# Patient Record
Sex: Male | Born: 1935
Health system: Southern US, Community
[De-identification: ages and names within clinical notes are randomized; demographics above are authoritative.]

## PROBLEM LIST (undated history)

## (undated) DIAGNOSIS — I10 Essential (primary) hypertension: Secondary | ICD-10-CM

## (undated) DIAGNOSIS — N179 Acute kidney failure, unspecified: Secondary | ICD-10-CM

## (undated) DIAGNOSIS — M109 Gout, unspecified: Secondary | ICD-10-CM

## (undated) HISTORY — PX: PROSTATE SURGERY: SHX751

---

## 1999-07-25 ENCOUNTER — Other Ambulatory Visit: Admission: RE | Admit: 1999-07-25 | Discharge: 1999-07-25 | Payer: Self-pay | Admitting: Urology

## 1999-08-17 ENCOUNTER — Encounter: Admission: RE | Admit: 1999-08-17 | Discharge: 1999-11-15 | Payer: Self-pay | Admitting: Radiation Oncology

## 1999-10-03 ENCOUNTER — Ambulatory Visit (HOSPITAL_BASED_OUTPATIENT_CLINIC_OR_DEPARTMENT_OTHER): Admission: RE | Admit: 1999-10-03 | Discharge: 1999-10-03 | Payer: Self-pay | Admitting: Urology

## 1999-10-03 ENCOUNTER — Encounter: Payer: Self-pay | Admitting: Urology

## 2007-06-18 ENCOUNTER — Ambulatory Visit (HOSPITAL_COMMUNITY): Admission: RE | Admit: 2007-06-18 | Discharge: 2007-06-18 | Payer: Self-pay | Admitting: Ophthalmology

## 2007-07-16 ENCOUNTER — Ambulatory Visit (HOSPITAL_COMMUNITY): Admission: RE | Admit: 2007-07-16 | Discharge: 2007-07-16 | Payer: Self-pay | Admitting: Ophthalmology

## 2009-10-21 ENCOUNTER — Encounter: Payer: Self-pay | Admitting: Internal Medicine

## 2009-11-12 ENCOUNTER — Ambulatory Visit: Payer: Self-pay | Admitting: Internal Medicine

## 2009-11-12 ENCOUNTER — Ambulatory Visit (HOSPITAL_COMMUNITY): Admission: RE | Admit: 2009-11-12 | Discharge: 2009-11-12 | Payer: Self-pay | Admitting: Internal Medicine

## 2011-09-19 LAB — BASIC METABOLIC PANEL
BUN: 21
CO2: 23
Calcium: 8.5
Chloride: 107
Creatinine, Ser: 1.47
GFR calc Af Amer: 56 — ABNORMAL LOW
GFR calc non Af Amer: 46 — ABNORMAL LOW
Glucose, Bld: 97
Potassium: 4.4
Sodium: 138

## 2011-09-19 LAB — HEMOGLOBIN AND HEMATOCRIT, BLOOD
HCT: 38.1 — ABNORMAL LOW
Hemoglobin: 13.1

## 2012-06-20 ENCOUNTER — Ambulatory Visit (HOSPITAL_COMMUNITY)
Admission: RE | Admit: 2012-06-20 | Discharge: 2012-06-20 | Disposition: A | Discharge status: Home / Self Care | Payer: Medicare Other | Source: Ambulatory Visit | Attending: Internal Medicine | Admitting: Internal Medicine

## 2012-06-20 ENCOUNTER — Other Ambulatory Visit (HOSPITAL_COMMUNITY): Payer: Self-pay | Admitting: Internal Medicine

## 2012-06-20 DIAGNOSIS — M899 Disorder of bone, unspecified: Secondary | ICD-10-CM | POA: Insufficient documentation

## 2012-06-20 DIAGNOSIS — M25572 Pain in left ankle and joints of left foot: Secondary | ICD-10-CM

## 2012-06-20 DIAGNOSIS — M898X9 Other specified disorders of bone, unspecified site: Secondary | ICD-10-CM | POA: Insufficient documentation

## 2012-06-20 DIAGNOSIS — M25579 Pain in unspecified ankle and joints of unspecified foot: Secondary | ICD-10-CM | POA: Insufficient documentation

## 2012-06-20 DIAGNOSIS — M949 Disorder of cartilage, unspecified: Secondary | ICD-10-CM | POA: Insufficient documentation

## 2014-08-21 ENCOUNTER — Observation Stay (HOSPITAL_COMMUNITY)
Admission: EM | Admit: 2014-08-21 | Discharge: 2014-08-22 | Disposition: A | Discharge status: Home / Self Care | Payer: Medicare Other | Attending: Internal Medicine | Admitting: Internal Medicine

## 2014-08-21 ENCOUNTER — Encounter (HOSPITAL_COMMUNITY): Payer: Self-pay | Admitting: Emergency Medicine

## 2014-08-21 ENCOUNTER — Emergency Department (HOSPITAL_COMMUNITY): Payer: Medicare Other

## 2014-08-21 DIAGNOSIS — N179 Acute kidney failure, unspecified: Secondary | ICD-10-CM | POA: Diagnosis not present

## 2014-08-21 DIAGNOSIS — R112 Nausea with vomiting, unspecified: Secondary | ICD-10-CM | POA: Diagnosis not present

## 2014-08-21 DIAGNOSIS — I10 Essential (primary) hypertension: Secondary | ICD-10-CM | POA: Diagnosis not present

## 2014-08-21 DIAGNOSIS — N189 Chronic kidney disease, unspecified: Secondary | ICD-10-CM

## 2014-08-21 DIAGNOSIS — K219 Gastro-esophageal reflux disease without esophagitis: Secondary | ICD-10-CM | POA: Insufficient documentation

## 2014-08-21 DIAGNOSIS — R072 Precordial pain: Secondary | ICD-10-CM | POA: Diagnosis not present

## 2014-08-21 DIAGNOSIS — I059 Rheumatic mitral valve disease, unspecified: Secondary | ICD-10-CM

## 2014-08-21 DIAGNOSIS — R079 Chest pain, unspecified: Secondary | ICD-10-CM | POA: Diagnosis present

## 2014-08-21 DIAGNOSIS — Z79899 Other long term (current) drug therapy: Secondary | ICD-10-CM | POA: Insufficient documentation

## 2014-08-21 HISTORY — DX: Gout, unspecified: M10.9

## 2014-08-21 HISTORY — DX: Essential (primary) hypertension: I10

## 2014-08-21 LAB — CBC WITH DIFFERENTIAL/PLATELET
Basophils Absolute: 0 10*3/uL (ref 0.0–0.1)
Basophils Relative: 1 % (ref 0–1)
EOS PCT: 1 % (ref 0–5)
Eosinophils Absolute: 0.1 10*3/uL (ref 0.0–0.7)
HCT: 35.3 % — ABNORMAL LOW (ref 39.0–52.0)
HEMOGLOBIN: 12 g/dL — AB (ref 13.0–17.0)
LYMPHS ABS: 1.1 10*3/uL (ref 0.7–4.0)
LYMPHS PCT: 14 % (ref 12–46)
MCH: 33.6 pg (ref 26.0–34.0)
MCHC: 34 g/dL (ref 30.0–36.0)
MCV: 98.9 fL (ref 78.0–100.0)
Monocytes Absolute: 0.5 10*3/uL (ref 0.1–1.0)
Monocytes Relative: 6 % (ref 3–12)
Neutro Abs: 6.5 10*3/uL (ref 1.7–7.7)
Neutrophils Relative %: 78 % — ABNORMAL HIGH (ref 43–77)
Platelets: 182 10*3/uL (ref 150–400)
RBC: 3.57 MIL/uL — AB (ref 4.22–5.81)
RDW: 12.4 % (ref 11.5–15.5)
WBC: 8.2 10*3/uL (ref 4.0–10.5)

## 2014-08-21 LAB — COMPREHENSIVE METABOLIC PANEL
ALK PHOS: 120 U/L — AB (ref 39–117)
ALT: 24 U/L (ref 0–53)
AST: 37 U/L (ref 0–37)
Albumin: 4.2 g/dL (ref 3.5–5.2)
Anion gap: 16 — ABNORMAL HIGH (ref 5–15)
BUN: 31 mg/dL — AB (ref 6–23)
CALCIUM: 9 mg/dL (ref 8.4–10.5)
CO2: 18 mEq/L — ABNORMAL LOW (ref 19–32)
Chloride: 103 mEq/L (ref 96–112)
Creatinine, Ser: 2.14 mg/dL — ABNORMAL HIGH (ref 0.50–1.35)
GFR, EST AFRICAN AMERICAN: 30 mL/min — AB (ref >90)
GFR, EST NON AFRICAN AMERICAN: 26 mL/min — AB (ref >90)
GLUCOSE: 163 mg/dL — AB (ref 70–99)
POTASSIUM: 4.6 meq/L (ref 3.7–5.3)
Sodium: 137 mEq/L (ref 137–147)
Total Bilirubin: 0.5 mg/dL (ref 0.3–1.2)
Total Protein: 7.2 g/dL (ref 6.0–8.3)

## 2014-08-21 LAB — TROPONIN I
Troponin I: <0.3 ng/mL (ref <0.30)
Troponin I: <0.3 ng/mL (ref <0.30)
Troponin I: <0.3 ng/mL (ref <0.30)

## 2014-08-21 MED ORDER — ASPIRIN EC 325 MG PO TBEC
325.0000 mg | DELAYED_RELEASE_TABLET | Freq: Every day | ORAL | Status: DC
  Administered 2014-08-22: 325 mg via ORAL
  Filled 2014-08-21 (×2): qty 1

## 2014-08-21 MED ORDER — HEPARIN SODIUM (PORCINE) 5000 UNIT/ML IJ SOLN
5000.0000 [IU] | Freq: Three times a day (TID) | INTRAMUSCULAR | Status: DC
  Administered 2014-08-21 – 2014-08-22 (×3): 5000 [IU] via SUBCUTANEOUS
  Filled 2014-08-21 (×3): qty 1

## 2014-08-21 MED ORDER — ASPIRIN 81 MG PO CHEW
324.0000 mg | CHEWABLE_TABLET | Freq: Once | ORAL | Status: AC
  Administered 2014-08-21: 324 mg via ORAL
  Filled 2014-08-21: qty 4

## 2014-08-21 MED ORDER — GI COCKTAIL ~~LOC~~: 30.0000 mL | Freq: Four times a day (QID) | ORAL | Status: DC | PRN

## 2014-08-21 MED ORDER — FELODIPINE ER 5 MG PO TB24
5.0000 mg | ORAL_TABLET | Freq: Every day | ORAL | Status: DC
  Administered 2014-08-22: 5 mg via ORAL
  Filled 2014-08-21 (×4): qty 1

## 2014-08-21 MED ORDER — NITROGLYCERIN 0.4 MG SL SUBL
0.4000 mg | SUBLINGUAL_TABLET | Freq: Once | SUBLINGUAL | Status: AC
  Administered 2014-08-21: 0.4 mg via SUBLINGUAL
  Filled 2014-08-21: qty 1

## 2014-08-21 MED ORDER — SODIUM CHLORIDE 0.9 % IV SOLN
Freq: Once | INTRAVENOUS | Status: AC
  Administered 2014-08-21: 06:00:00 via INTRAVENOUS

## 2014-08-21 MED ORDER — MORPHINE SULFATE 2 MG/ML IJ SOLN
2.0000 mg | Freq: Once | INTRAMUSCULAR | Status: AC
  Administered 2014-08-21: 2 mg via INTRAVENOUS
  Filled 2014-08-21: qty 1

## 2014-08-21 MED ORDER — NITROGLYCERIN 2 % TD OINT
1.0000 [in_us] | TOPICAL_OINTMENT | Freq: Once | TRANSDERMAL | Status: AC
  Administered 2014-08-21: 1 [in_us] via TOPICAL
  Filled 2014-08-21: qty 1

## 2014-08-21 MED ORDER — ACETAMINOPHEN 325 MG PO TABS: 650.0000 mg | ORAL_TABLET | ORAL | Status: DC | PRN

## 2014-08-21 MED ORDER — ATENOLOL 25 MG PO TABS
50.0000 mg | ORAL_TABLET | Freq: Every day | ORAL | Status: DC
  Administered 2014-08-22: 50 mg via ORAL
  Filled 2014-08-21 (×2): qty 2

## 2014-08-21 MED ORDER — MORPHINE SULFATE 2 MG/ML IJ SOLN: 2.0000 mg | INTRAMUSCULAR | Status: DC | PRN

## 2014-08-21 MED ORDER — ONDANSETRON HCL 4 MG/2ML IJ SOLN
4.0000 mg | Freq: Four times a day (QID) | INTRAMUSCULAR | Status: DC | PRN
  Administered 2014-08-21 – 2014-08-22 (×4): 4 mg via INTRAVENOUS
  Filled 2014-08-21 (×5): qty 2

## 2014-08-21 NOTE — H&P (Signed)
History and Physical  Robert Cardenas UJW:[id number] DOB: [birth year] DOA: 08/21/2014  Referring physician: Dr. Estell Harpin in ED PCP: Carylon Perches, MD   Chief Complaint: chest pain  HPI:  80 year old man with history of hypertension presented with approximately 12 hours of chest pain. Initial evaluation was unremarkable, referred for observation.  Patient in relatively good health, treated for hypertension but no other medical problems, no history of heart disease. Fairly active at baseline with no chest pain or shortness of breath typically. Yesterday he had an attack of gout and took several doses of colchicine and then had several episodes of diarrhea. Gout resolved. Last evening he went out to dinner ate some barbecue and returned home. Later that evening he developed centralized chest pain perhaps 6/10 which was not relieved with Tylenol, aspirin. Symptoms persisted throughout the night without change. 1 episode of small amount of emesis. No diaphoresis. No pain in his left arm neck or jaw. No shortness of breath. On presentation to the emergency department he was treated with aspirin, morphine and nitroglycerin and currently his pain is much improved, he feels comfortable and like "I could sleep".  In the emergency department afebrile, vital signs stable. No hypoxia. During the interview the patient had oxygen saturation 98-100% on room air. Respiratory rate was normal. No previous laboratory studies available. BUN 31, creatinine 2.14. CO2 18. Troponin negative. CBC unremarkable. Chest x-ray no pain in her feet, no acute disease. EKG independently reviewed normal sinus rhythm with PVCs and supraventricular complexes. No acute changes seen.  Review of Systems:  Negative for fever, visual changes, sore throat, rash, new muscle aches, SOB, dysuria, bleeding, abdominal pain.  Past Medical History  Diagnosis Date  . Hypertension   . Gout     Past Surgical History  Procedure Laterality Date  .  Prostate surgery      Social History:  reports that he has never smoked. He does not have any smokeless tobacco history on file. He reports that he does not drink alcohol or use illicit drugs.  No Known Allergies  History reviewed. No pertinent family history. He states no particular medical problems in family.  Prior to Admission medications   Medication Sig Start Date End Date Taking? Authorizing Provider  atenolol (TENORMIN) 50 MG tablet Take 50 mg by mouth daily.   Yes Historical Provider, MD  felodipine (PLENDIL) 5 MG 24 hr tablet Take 5 mg by mouth daily.   Yes Historical Provider, MD  lisinopril (PRINIVIL,ZESTRIL) 40 MG tablet Take 40 mg by mouth daily.   Yes Historical Provider, MD   Physical Exam: Filed Vitals:   08/21/14 0605 08/21/14 0621  BP: 149/69 126/73  Pulse: 74 70  Temp: 98.8 F (37.1 C)   TempSrc: Oral   Resp: 17   Height:  (1.778 m)   Weight: 72.576 kg (160 lb)   SpO2: 99% 92%    General: Examined in the emergency department. Appears calm and comfortable Eyes: PERRL, normal lids, irises  ENT: grossly normal hearing, lips & tongue Neck: no LAD, masses or thyromegaly Cardiovascular: RRR, 2/6 systolic murmur, no rub or gallop. No LE edema. Bilateral radial pulses 2+. Bilateral dorsal pedis pulses 2+. Respiratory: CTA bilaterally, no w/r/r. Normal respiratory effort. Abdomen: soft, ntnd Skin: no rash or induration seen  Musculoskeletal: grossly normal tone BUE/BLE Psychiatric: grossly normal mood and affect, speech fluent and appropriate Neurologic: grossly non-focal.  Wt Readings from Last 3 Encounters:  08/21/14 72.576 kg (160 lb)    Labs on Admission:  Basic Metabolic Panel:  Recent Labs Lab 08/21/14 0604  NA 137  K 4.6  CL 103  CO2 18*  GLUCOSE 163*  BUN 31*  CREATININE 2.14*  CALCIUM 9.0    Liver Function Tests:  Recent Labs Lab 08/21/14 0604  AST 37  ALT 24  ALKPHOS 120*  BILITOT 0.5  PROT 7.2  ALBUMIN 4.2     CBC:  Recent Labs Lab 08/21/14 0604  WBC 8.2  NEUTROABS 6.5  HGB 12.0*  HCT 35.3*  MCV 98.9  PLT 182    Cardiac Enzymes:  Recent Labs Lab 08/21/14 0604  TROPONINI <0.30     Radiological Exams on Admission: Dg Chest Portable 1 View  08/21/2014   CLINICAL DATA:  Chest pain, nausea and vomiting.  EXAM: PORTABLE CHEST - 1 VIEW  COMPARISON:  None.  FINDINGS: The lungs are well-aerated. Pulmonary vascularity is at the upper limits of normal. There is no evidence of focal opacification, pleural effusion or pneumothorax.  The cardiomediastinal silhouette is within normal limits. No acute osseous abnormalities are seen.  IMPRESSION: No acute cardiopulmonary process seen.   Electronically Signed   By: Roanna Raider M.D.   On: 08/21/2014 06:37      Principal Problem:   Chest pain Active Problems:   Acute renal failure   HTN (hypertension)   Assessment/Plan 1. Chest pain with some typical and atypical features. Now over 12 hours in duration with negative troponin and EKG nonacute. Heart score 4. Doubt ACS. Well's score = 0. No hypoxia.  2. Acute renal failure vs CKD. Suspect acute with associated metabolic acidosis. Possibly secondary to multiple doses of colchicine yesterday. 3. HTN stable. 4. H/o gout. Stable.    Appears stable for admission to telemetry bed. Plan serial troponin, 2-D echocardiogram. Cardiology consultation for further evaluation.  Hold lisinopril. Hydrate. Repeat basic metabolic panel in the morning.  Code Status: full code  DVT prophylaxis:Lovenox Family Communication: none present Disposition Plan/Anticipated LOS: obs, 24 housr  Time spent: 50 minutes  Brendia Sacks, MD  [facility] Pager [phone] 08/21/2014, 7:56 AM

## 2014-08-21 NOTE — ED Notes (Signed)
Patient has no change in pain at this time. EDP notified. Verbal order for  morphine received at this time time from Dr. Estell Harpin

## 2014-08-21 NOTE — Consult Note (Signed)
CARDIOLOGY CONSULT NOTE  Patient ID: Robert Cardenas MRN: [id number] DOB/AGE: [birth year] 78 y.o.  Admit date: 08/21/2014 Primary Physician Carylon Perches, MD  Reason for Consultation:  Chest pain  HPI: 80 yr old male with HTN and gout admitted for chest pain. He first experienced a gout flare and took colchicine which was followed by repetitive bouts of diarrhea. He had then been eating BBQ afterwards and then went home and slept. He was awoken by a retrosternal "chest ache which wouldn't go away". He tried ASA, Tylenol, and Pepto Bismol without relief.  He thought eating prunes might help but vomited after eating just one. He denies associated shortness of breath, palpitations, lightheadedness, diaphoresis, arm pain, back pain, and dizziness. He denies any history of heart disease nor having chest pain beforehand. He now feels somewhat nauseous but says his chest pain is mostly resolved. He was treated with morphine and nitrates in the ED. He has since ruled out for an acute coronary syndrome. ECG demonstrated sinus rhythm with PAC's, PVC's, and LAFB. Chest xray was unremarkable. I personally reviewed the echocardiogram which demonstrated normal LV systolic function and regional wall motion, mild to moderate mitral regurgitation, and mild tricuspid regurgitation.  Soc: Widowed since February. Married for 55 years. Has one son who lives in [place]. 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   No Known Allergies  Current Facility-Administered Medications  Medication Dose Route Frequency Provider Last Rate Last Dose  . acetaminophen (TYLENOL) tablet 650 mg  650 mg Oral Q4H PRN Standley Brooking, MD      . Melene Muller ON 08/22/2014] aspirin EC tablet 325 mg  325 mg Oral Daily Standley Brooking, MD      . atenolol (TENORMIN) tablet 50 mg  50 mg Oral Daily Standley Brooking, MD      . felodipine (PLENDIL) 24 hr tablet 5 mg  5 mg Oral Daily Standley Brooking, MD      .  gi cocktail (Maalox,Lidocaine,Donnatal)  30 mL Oral QID PRN Standley Brooking, MD      . heparin injection 5,000 Units  5,000 Units Subcutaneous 3 times per day Standley Brooking, MD   5,000 Units at 08/21/14 1424  . morphine 2 MG/ML injection 2 mg  2 mg Intravenous Q2H PRN Standley Brooking, MD      . ondansetron [facility]) injection 4 mg  4 mg Intravenous Q6H PRN Standley Brooking, MD   4 mg at 08/21/14 1525    Past Medical History  Diagnosis Date  . Hypertension   . Gout     Past Surgical History  Procedure Laterality Date  . Prostate surgery      History   Social History  . Marital Status: Widowed    Spouse Name: N/A    Number of Children: N/A  . Years of Education: N/A   Occupational History  . Not on file.   Social History Main Topics  . Smoking status: Never Smoker   . Smokeless tobacco: Not on file  . Alcohol Use: No  . Drug Use: No  . Sexual Activity: Not Currently   Other Topics Concern  . Not on file   Social History Narrative  . No narrative on file     No family history of premature CAD in 1st degree relatives.  Prior to Admission medications   Medication Sig Start Date End Date Taking? Authorizing Provider  Ascorbic Acid (VITAMIN C PO)  Take 1 tablet by mouth daily.   Yes Historical Provider, MD  aspirin EC 81 MG tablet Take 81 mg by mouth daily.   Yes Historical Provider, MD  atenolol (TENORMIN) 50 MG tablet Take 50 mg by mouth daily.   Yes Historical Provider, MD  felodipine (PLENDIL) 5 MG 24 hr tablet Take 5 mg by mouth daily.   Yes Historical Provider, MD  lisinopril (PRINIVIL,ZESTRIL) 40 MG tablet Take 40 mg by mouth daily.   Yes Historical Provider, MD     Review of systems complete and found to be negative unless listed above in HPI     Physical exam Blood pressure 144/75, pulse 55, temperature 97.8 F (36.6 C), temperature source Oral, resp. rate 15, height  (1.778 m), weight 160 lb (72.576 kg), SpO2 97.00%. General: NAD, elderly  male. Neck: No JVD, no thyromegaly or thyroid nodule.  Lungs: Clear to auscultation bilaterally with normal respiratory effort. CV: Nondisplaced PMI. Regular rate and rhythm, normal S1/S2, no S3/S4, II/VI holosystolic murmur along left sternal border and apex.  No peripheral edema.  No carotid bruit.  Normal pedal pulses.  Abdomen: Soft, nontender, no hepatosplenomegaly, no distention.  Skin: Intact without lesions or rashes.  Neurologic: Alert and oriented x 3.  Psych: Normal affect. Extremities: No clubbing or cyanosis.  HEENT: Normal.   ECG: Most recent ECG reviewed.  Labs:   Lab Results  Component Value Date   WBC 8.2 08/21/2014   HGB 12.0* 08/21/2014   HCT 35.3* 08/21/2014   MCV 98.9 08/21/2014   PLT 182 08/21/2014    Recent Labs Lab 08/21/14 0604  NA 137  K 4.6  CL 103  CO2 18*  BUN 31*  CREATININE 2.14*  CALCIUM 9.0  PROT 7.2  BILITOT 0.5  ALKPHOS 120*  ALT 24  AST 37  GLUCOSE 163*   Lab Results  Component Value Date   TROPONINI <0.30 08/21/2014    No results found for this basename: CHOL   No results found for this basename: HDL   No results found for this basename: LDLCALC   No results found for this basename: TRIG   No results found for this basename: CHOLHDL   No results found for this basename: LDLDIRECT         Studies: Dg Chest Portable 1 View  08/21/2014   CLINICAL DATA:  Chest pain, nausea and vomiting.  EXAM: PORTABLE CHEST - 1 VIEW  COMPARISON:  None.  FINDINGS: The lungs are well-aerated. Pulmonary vascularity is at the upper limits of normal. There is no evidence of focal opacification, pleural effusion or pneumothorax.  The cardiomediastinal silhouette is within normal limits. No acute osseous abnormalities are seen.  IMPRESSION: No acute cardiopulmonary process seen.   Electronically Signed   By: Roanna Raider M.D.   On: 08/21/2014 06:37    ASSESSMENT AND PLAN:  1. Chest pain: Atypical for a cardiac etiology, especially in light of  normal troponins, nonischemic ECG, and normal LV systolic function and regional wall motion. Given ongoing nausea, perhaps related to GERD +/- esophagitis. I will arrange for follow up in the clinic with me and decide on whether or not to pursue stress testing at that time. If he remains stable, he should be able to be discharged. 2. GERD: Would consider starting omeprazole 20 mg daily, but will defer to hospitalist service. 3. Essential HTN: Controlled on present therapy which includes felodipine. 4. Acute renal failure: Likely secondary to excess colchicine use. Agree with IV hydration.  Signed: Prentice Docker, M.D., F.A.C.C.  08/21/2014, 5:26 PM

## 2014-08-21 NOTE — ED Notes (Signed)
Patients son called at this time at patient request

## 2014-08-21 NOTE — ED Provider Notes (Signed)
CSN: [id number]     Arrival date & time 08/21/14  0545 History   First MD Initiated Contact with Patient 08/21/14 [phone]     Chief Complaint  Patient presents with  . Chest Pain     (Consider location/radiation/quality/duration/timing/severity/associated sxs/prior Treatment) Patient is a 80 y.o. male presenting with chest pain. The history is provided by the patient (the pt complains of chest pain since last night).  Chest Pain Pain location:  Substernal area Pain quality: aching   Pain radiates to:  Does not radiate Pain radiates to the back: no   Pain severity:  Moderate Onset quality:  Sudden Timing:  Constant Progression:  Waxing and waning Chronicity:  New Context: not breathing   Associated symptoms: no abdominal pain, no back pain, no cough, no fatigue and no headache     Past Medical History  Diagnosis Date  . Hypertension    Past Surgical History  Procedure Laterality Date  . Prostate surgery     History reviewed. No pertinent family history. History  Substance Use Topics  . Smoking status: Never Smoker   . Smokeless tobacco: Not on file  . Alcohol Use: No    Review of Systems  Constitutional: Negative for appetite change and fatigue.  HENT: Negative for congestion, ear discharge and sinus pressure.   Eyes: Negative for discharge.  Respiratory: Negative for cough.   Cardiovascular: Positive for chest pain.  Gastrointestinal: Negative for abdominal pain and diarrhea.  Genitourinary: Negative for frequency and hematuria.  Musculoskeletal: Negative for back pain.  Skin: Negative for rash.  Neurological: Negative for seizures and headaches.  Psychiatric/Behavioral: Negative for hallucinations.      Allergies  Review of patient's allergies indicates no known allergies.  Home Medications   Prior to Admission medications   Medication Sig Start Date End Date Taking? Authorizing Provider  atenolol (TENORMIN) 50 MG tablet Take 50 mg by mouth daily.   Yes  Historical Provider, MD  felodipine (PLENDIL) 5 MG 24 hr tablet Take 5 mg by mouth daily.   Yes Historical Provider, MD  lisinopril (PRINIVIL,ZESTRIL) 40 MG tablet Take 40 mg by mouth daily.   Yes Historical Provider, MD   BP 126/73  Pulse 70  Temp(Src) 98.8 F (37.1 C) (Oral)  Resp 17  Ht  (1.778 m)  Wt 160 lb (72.576 kg)  BMI 22.96 kg/m2  SpO2 92% Physical Exam  Constitutional: He is oriented to person, place, and time. He appears well-developed.  HENT:  Head: Normocephalic.  Eyes: Conjunctivae and EOM are normal. No scleral icterus.  Neck: Neck supple. No thyromegaly present.  Cardiovascular: Normal rate and regular rhythm.  Exam reveals no gallop and no friction rub.   No murmur heard. Pulmonary/Chest: No stridor. He has no wheezes. He has no rales. He exhibits no tenderness.  Abdominal: He exhibits no distension. There is no tenderness. There is no rebound.  Musculoskeletal: Normal range of motion. He exhibits no edema.  Lymphadenopathy:    He has no cervical adenopathy.  Neurological: He is oriented to person, place, and time. He exhibits normal muscle tone. Coordination normal.  Skin: No rash noted. No erythema.  Psychiatric: He has a normal mood and affect. His behavior is normal.    ED Course  Procedures (including critical care time) Labs Review Labs Reviewed  CBC WITH DIFFERENTIAL - Abnormal; Notable for the following:    RBC 3.57 (*)    Hemoglobin 12.0 (*)    HCT 35.3 (*)    Neutrophils Relative %  78 (*)    All other components within normal limits  COMPREHENSIVE METABOLIC PANEL - Abnormal; Notable for the following:    CO2 18 (*)    Glucose, Bld 163 (*)    BUN 31 (*)    Creatinine, Ser 2.14 (*)    Alkaline Phosphatase 120 (*)    GFR calc non Af Amer 26 (*)    GFR calc Af Amer 30 (*)    Anion gap 16 (*)    All other components within normal limits  TROPONIN I    Imaging Review Dg Chest Portable 1 View  08/21/2014   CLINICAL DATA:  Chest pain,  nausea and vomiting.  EXAM: PORTABLE CHEST - 1 VIEW  COMPARISON:  None.  FINDINGS: The lungs are well-aerated. Pulmonary vascularity is at the upper limits of normal. There is no evidence of focal opacification, pleural effusion or pneumothorax.  The cardiomediastinal silhouette is within normal limits. No acute osseous abnormalities are seen.  IMPRESSION: No acute cardiopulmonary process seen.   Electronically Signed   By: Roanna Raider M.D.   On: 08/21/2014 06:37     EKG Interpretation None      MDM   Final diagnoses:  None    Admit chest pain    Benny Lennert, MD 08/21/14 [phone]

## 2014-08-21 NOTE — ED Notes (Signed)
Patient states chest pain started last night at 2030 before he went to bed. Patient states he had a "slight sweat" patient also states he had some nausea and vomiting. Patient states he was unable to sleep last night due to the pain in his chest, patient localizes pain to center chest. Denies radiation. A&OX4.

## 2014-08-21 NOTE — Progress Notes (Signed)
  Echocardiogram 2D Echocardiogram has been performed.  Robert Cardenas 08/21/2014, 4:45 PM

## 2014-08-22 LAB — BASIC METABOLIC PANEL
Anion gap: 14 (ref 5–15)
BUN: 25 mg/dL — ABNORMAL HIGH (ref 6–23)
CO2: 20 mEq/L (ref 19–32)
Calcium: 8.6 mg/dL (ref 8.4–10.5)
Chloride: 102 mEq/L (ref 96–112)
Creatinine, Ser: 1.81 mg/dL — ABNORMAL HIGH (ref 0.50–1.35)
GFR calc Af Amer: 37 mL/min — ABNORMAL LOW (ref >90)
GFR, EST NON AFRICAN AMERICAN: 32 mL/min — AB (ref >90)
GLUCOSE: 114 mg/dL — AB (ref 70–99)
POTASSIUM: 4.7 meq/L (ref 3.7–5.3)
Sodium: 136 mEq/L — ABNORMAL LOW (ref 137–147)

## 2014-08-22 MED ORDER — ONDANSETRON HCL 4 MG PO TABS: 4.0000 mg | ORAL_TABLET | Freq: Three times a day (TID) | ORAL | Status: DC | PRN

## 2014-08-22 NOTE — Discharge Summary (Signed)
Physician Discharge Summary  Robert Cardenas ZOX:[id number] DOB: 1926-11-14 DOA: 08/21/2014   Admit date: 08/21/2014 Discharge date: 08/22/2014  Discharge Diagnoses: #1. Chest pain #2. Hypertension. #3. Chronic kidney disease stage III. #4. Gout. Principal Problem:   Chest pain Active Problems:   Acute renal failure   HTN (hypertension)    Wt Readings from Last 3 Encounters:  08/21/14 160 lb (72.576 kg)     Hospital Course:  This patient is an 80 year old male who presented to the emergency room after experiencing chest pain overnight. He experienced nausea but did not vomit. His initial EKG revealed normal sinus rhythm with PACs and PVCs. Chest x-ray revealed no acute infiltrate. He was hospitalized and monitored setting and ruled out for an MI with negative troponins. He underwent echocardiogram which revealed normal LV function with no wall motion abnormalities. Mild to moderate mitral regurgitation was present. There was mild LVH. His symptoms resolved. He experienced a low-grade fever on the morning of the 19th at 100.1. His white count was normal. He has stable renal function with a creatinine in the 2 range chronically.  Symptoms were felt to likely be related to a possible gastroenteritis. He feels much better and would like to go home on the morning of September 19.  He is seen in consultation by cardiology. It was not felt that his initial symptoms were cardiac in nature.  He will continue his usual antihypertensive regimen. He'll be seen in followup in office in one week. He was prescribed Zofran to use as needed for nausea.   Discharge Instructions     Medication List         aspirin EC 81 MG tablet  Take 81 mg by mouth daily.     atenolol 50 MG tablet  Commonly known as:  TENORMIN  Take 50 mg by mouth daily.     felodipine 5 MG 24 hr tablet  Commonly known as:  PLENDIL  Take 5 mg by mouth daily.     lisinopril 40 MG tablet  Commonly known as:   PRINIVIL,ZESTRIL  Take 40 mg by mouth daily.     ondansetron 4 MG tablet  Commonly known as:  ZOFRAN  Take 1 tablet (4 mg total) by mouth every 8 (eight) hours as needed for nausea or vomiting.     VITAMIN C PO  Take 1 tablet by mouth daily.         [name] 08/22/2014

## 2014-08-22 NOTE — Progress Notes (Signed)
Patient with orders to be discharge home. Discharge instructions given, patient verbalized understanding. Patient stable. Patient left in private vehicle.  

## 2014-10-06 ENCOUNTER — Encounter: Payer: BC Managed Care – PPO | Admitting: Cardiovascular Disease

## 2014-12-01 ENCOUNTER — Encounter: Payer: Self-pay | Admitting: *Deleted

## 2015-04-15 ENCOUNTER — Emergency Department (HOSPITAL_COMMUNITY): Payer: Medicare Other

## 2015-04-15 ENCOUNTER — Encounter (HOSPITAL_COMMUNITY): Payer: Self-pay | Admitting: *Deleted

## 2015-04-15 ENCOUNTER — Inpatient Hospital Stay (HOSPITAL_COMMUNITY)
Admission: EM | Admit: 2015-04-15 | Discharge: 2015-04-17 | DRG: 683 | Disposition: A | Discharge status: Skilled Nursing Facility | Payer: Medicare Other | Attending: Internal Medicine | Admitting: Internal Medicine

## 2015-04-15 ENCOUNTER — Inpatient Hospital Stay (HOSPITAL_COMMUNITY): Payer: Medicare Other

## 2015-04-15 DIAGNOSIS — Z7982 Long term (current) use of aspirin: Secondary | ICD-10-CM

## 2015-04-15 DIAGNOSIS — I129 Hypertensive chronic kidney disease with stage 1 through stage 4 chronic kidney disease, or unspecified chronic kidney disease: Secondary | ICD-10-CM | POA: Diagnosis present

## 2015-04-15 DIAGNOSIS — Y92238 Other place in hospital as the place of occurrence of the external cause: Secondary | ICD-10-CM

## 2015-04-15 DIAGNOSIS — W010XXA Fall on same level from slipping, tripping and stumbling without subsequent striking against object, initial encounter: Secondary | ICD-10-CM | POA: Diagnosis not present

## 2015-04-15 DIAGNOSIS — E860 Dehydration: Secondary | ICD-10-CM | POA: Diagnosis present

## 2015-04-15 DIAGNOSIS — N183 Chronic kidney disease, stage 3 (moderate): Secondary | ICD-10-CM | POA: Diagnosis present

## 2015-04-15 DIAGNOSIS — N179 Acute kidney failure, unspecified: Secondary | ICD-10-CM | POA: Diagnosis not present

## 2015-04-15 DIAGNOSIS — E872 Acidosis, unspecified: Secondary | ICD-10-CM | POA: Diagnosis present

## 2015-04-15 DIAGNOSIS — E875 Hyperkalemia: Secondary | ICD-10-CM | POA: Diagnosis not present

## 2015-04-15 DIAGNOSIS — E8729 Other acidosis: Secondary | ICD-10-CM | POA: Diagnosis present

## 2015-04-15 DIAGNOSIS — S82041A Displaced comminuted fracture of right patella, initial encounter for closed fracture: Secondary | ICD-10-CM | POA: Diagnosis not present

## 2015-04-15 DIAGNOSIS — M109 Gout, unspecified: Secondary | ICD-10-CM | POA: Insufficient documentation

## 2015-04-15 DIAGNOSIS — I10 Essential (primary) hypertension: Secondary | ICD-10-CM | POA: Diagnosis present

## 2015-04-15 DIAGNOSIS — S82044A Nondisplaced comminuted fracture of right patella, initial encounter for closed fracture: Secondary | ICD-10-CM | POA: Diagnosis not present

## 2015-04-15 DIAGNOSIS — N189 Chronic kidney disease, unspecified: Secondary | ICD-10-CM | POA: Diagnosis present

## 2015-04-15 DIAGNOSIS — R5383 Other fatigue: Secondary | ICD-10-CM | POA: Diagnosis present

## 2015-04-15 HISTORY — DX: Acute kidney failure, unspecified: N17.9

## 2015-04-15 LAB — COMPREHENSIVE METABOLIC PANEL
ALBUMIN: 4.1 g/dL (ref 3.5–5.0)
ALK PHOS: 84 U/L (ref 38–126)
ALT: 12 U/L — AB (ref 17–63)
ANION GAP: 8 (ref 5–15)
AST: 18 U/L (ref 15–41)
BUN: 35 mg/dL — AB (ref 6–20)
CALCIUM: 8.7 mg/dL — AB (ref 8.9–10.3)
CO2: 14 mmol/L — AB (ref 22–32)
Chloride: 117 mmol/L — ABNORMAL HIGH (ref 101–111)
Creatinine, Ser: 2.76 mg/dL — ABNORMAL HIGH (ref 0.61–1.24)
GFR calc non Af Amer: 19 mL/min — ABNORMAL LOW (ref >60)
GFR, EST AFRICAN AMERICAN: 22 mL/min — AB (ref >60)
GLUCOSE: 104 mg/dL — AB (ref 65–99)
Potassium: 6.2 mmol/L (ref 3.5–5.1)
SODIUM: 139 mmol/L (ref 135–145)
Total Bilirubin: 0.6 mg/dL (ref 0.3–1.2)
Total Protein: 6.8 g/dL (ref 6.5–8.1)

## 2015-04-15 LAB — BASIC METABOLIC PANEL
Anion gap: 7 (ref 5–15)
BUN: 35 mg/dL — ABNORMAL HIGH (ref 6–20)
CO2: 17 mmol/L — AB (ref 22–32)
Calcium: 8.5 mg/dL — ABNORMAL LOW (ref 8.9–10.3)
Chloride: 114 mmol/L — ABNORMAL HIGH (ref 101–111)
Creatinine, Ser: 2.58 mg/dL — ABNORMAL HIGH (ref 0.61–1.24)
GFR calc Af Amer: 24 mL/min — ABNORMAL LOW (ref >60)
GFR calc non Af Amer: 21 mL/min — ABNORMAL LOW (ref >60)
Glucose, Bld: 137 mg/dL — ABNORMAL HIGH (ref 65–99)
Potassium: 6.3 mmol/L (ref 3.5–5.1)
SODIUM: 138 mmol/L (ref 135–145)

## 2015-04-15 LAB — URINALYSIS, ROUTINE W REFLEX MICROSCOPIC
BILIRUBIN URINE: NEGATIVE
Glucose, UA: NEGATIVE mg/dL
Hgb urine dipstick: NEGATIVE
KETONES UR: NEGATIVE mg/dL
Leukocytes, UA: NEGATIVE
Nitrite: NEGATIVE
Protein, ur: NEGATIVE mg/dL
Specific Gravity, Urine: >1.03 — ABNORMAL HIGH (ref 1.005–1.030)
Urobilinogen, UA: 0.2 mg/dL (ref 0.0–1.0)
pH: 5.5 (ref 5.0–8.0)

## 2015-04-15 LAB — CBC
HCT: 35.2 % — ABNORMAL LOW (ref 39.0–52.0)
Hemoglobin: 11.2 g/dL — ABNORMAL LOW (ref 13.0–17.0)
MCH: 32.3 pg (ref 26.0–34.0)
MCHC: 31.8 g/dL (ref 30.0–36.0)
MCV: 101.4 fL — ABNORMAL HIGH (ref 78.0–100.0)
Platelets: 212 10*3/uL (ref 150–400)
RBC: 3.47 MIL/uL — ABNORMAL LOW (ref 4.22–5.81)
RDW: 13 % (ref 11.5–15.5)
WBC: 11.2 10*3/uL — ABNORMAL HIGH (ref 4.0–10.5)

## 2015-04-15 LAB — TROPONIN I

## 2015-04-15 LAB — GLUCOSE, CAPILLARY: Glucose-Capillary: 291 mg/dL — ABNORMAL HIGH (ref 65–99)

## 2015-04-15 LAB — INFLUENZA PANEL BY PCR (TYPE A & B)
H1N1FLUPCR: NOT DETECTED
INFLAPCR: NEGATIVE
Influenza B By PCR: NEGATIVE

## 2015-04-15 MED ORDER — HYDROCODONE-ACETAMINOPHEN 5-325 MG PO TABS
1.0000 | ORAL_TABLET | ORAL | Status: DC | PRN
  Administered 2015-04-15: 1 via ORAL
  Administered 2015-04-16: 2 via ORAL
  Filled 2015-04-15: qty 1
  Filled 2015-04-15: qty 2

## 2015-04-15 MED ORDER — ASPIRIN EC 81 MG PO TBEC
81.0000 mg | DELAYED_RELEASE_TABLET | Freq: Every day | ORAL | Status: DC
  Administered 2015-04-16 – 2015-04-17 (×2): 81 mg via ORAL
  Filled 2015-04-15 (×2): qty 1

## 2015-04-15 MED ORDER — SODIUM POLYSTYRENE SULFONATE 15 GM/60ML PO SUSP
15.0000 g | Freq: Once | ORAL | Status: AC
  Administered 2015-04-15: 15 g via ORAL
  Filled 2015-04-15: qty 60

## 2015-04-15 MED ORDER — SODIUM CHLORIDE 0.9 % IV SOLN
INTRAVENOUS | Status: DC
  Administered 2015-04-15: 999 mL via INTRAVENOUS

## 2015-04-15 MED ORDER — SODIUM CHLORIDE 0.9 % IJ SOLN
3.0000 mL | Freq: Two times a day (BID) | INTRAMUSCULAR | Status: DC
  Administered 2015-04-16 – 2015-04-17 (×2): 3 mL via INTRAVENOUS

## 2015-04-15 MED ORDER — ONDANSETRON HCL 4 MG/2ML IJ SOLN: 4.0000 mg | Freq: Four times a day (QID) | INTRAMUSCULAR | Status: DC | PRN

## 2015-04-15 MED ORDER — ENOXAPARIN SODIUM 30 MG/0.3ML ~~LOC~~ SOLN
30.0000 mg | SUBCUTANEOUS | Status: DC
  Administered 2015-04-16: 30 mg via SUBCUTANEOUS
  Filled 2015-04-15: qty 0.3

## 2015-04-15 MED ORDER — SODIUM CHLORIDE 0.45 % IV SOLN
INTRAVENOUS | Status: DC
  Administered 2015-04-15: 15:00:00 via INTRAVENOUS

## 2015-04-15 MED ORDER — ONDANSETRON HCL 4 MG PO TABS: 4.0000 mg | ORAL_TABLET | Freq: Four times a day (QID) | ORAL | Status: DC | PRN

## 2015-04-15 MED ORDER — SODIUM CHLORIDE 0.9 % IV SOLN
1.0000 g | Freq: Once | INTRAVENOUS | Status: AC
  Administered 2015-04-15: 1 g via INTRAVENOUS
  Filled 2015-04-15 (×2): qty 10

## 2015-04-15 MED ORDER — ENOXAPARIN SODIUM 40 MG/0.4ML ~~LOC~~ SOLN: 40.0000 mg | SUBCUTANEOUS | Status: DC

## 2015-04-15 MED ORDER — ALUM & MAG HYDROXIDE-SIMETH 200-200-20 MG/5ML PO SUSP: 30.0000 mL | Freq: Four times a day (QID) | ORAL | Status: DC | PRN

## 2015-04-15 MED ORDER — TRAZODONE HCL 50 MG PO TABS: 25.0000 mg | ORAL_TABLET | Freq: Every evening | ORAL | Status: DC | PRN

## 2015-04-15 MED ORDER — SODIUM CHLORIDE 0.9 % IV BOLUS (SEPSIS)
500.0000 mL | Freq: Once | INTRAVENOUS | Status: AC
  Administered 2015-04-15: 500 mL via INTRAVENOUS

## 2015-04-15 MED ORDER — INSULIN ASPART 100 UNIT/ML ~~LOC~~ SOLN
10.0000 [IU] | Freq: Once | SUBCUTANEOUS | Status: AC
  Administered 2015-04-15: 10 [IU] via SUBCUTANEOUS

## 2015-04-15 MED ORDER — DEXTROSE 50 % IV SOLN
1.0000 | Freq: Once | INTRAVENOUS | Status: AC
  Administered 2015-04-15: 50 mL via INTRAVENOUS
  Filled 2015-04-15: qty 50

## 2015-04-15 MED ORDER — FELODIPINE ER 5 MG PO TB24
5.0000 mg | ORAL_TABLET | Freq: Every day | ORAL | Status: DC
  Administered 2015-04-16 – 2015-04-17 (×2): 5 mg via ORAL
  Filled 2015-04-15 (×5): qty 1

## 2015-04-15 MED ORDER — ACETAMINOPHEN 650 MG RE SUPP
650.0000 mg | Freq: Four times a day (QID) | RECTAL | Status: DC | PRN
Start: 2015-04-15 — End: 2015-04-17

## 2015-04-15 MED ORDER — ACETAMINOPHEN 325 MG PO TABS
650.0000 mg | ORAL_TABLET | Freq: Four times a day (QID) | ORAL | Status: DC | PRN
  Administered 2015-04-15 – 2015-04-16 (×2): 650 mg via ORAL
  Filled 2015-04-15 (×2): qty 2

## 2015-04-15 NOTE — H&P (Signed)
[facility] History and Physical  MAYRA BRAHM ZJQ:[id number] DOB: June 25, 1926 DOA: 04/15/2015  Referring physician: Thurnell Garbe PCP: Asencion Noble, MD   Chief Complaint: abnormal labs  HPI: Robert Cardenas is a delightful 81 y.o. male the history of hypertension presents to the emergency department with the chief complaint of abnormal labs. Initial evaluation reveals acute renal failure, hyperkalemia, metabolic acidosis.  Patient in relatively good health treated for hypertension and history of gout but no other medical problems reports feeling "not well for the last 10 days to 2 weeks". He reports persistent intermittent nasal congestion, dry cough, headache fatigue anorexia nausea with no vomiting. He reports frequent loose stools due to the fact that he's been taken daily Ex-Lax for at least 5 days to "try and get rid of the cold in my head". He denies fever chills chest pain palpitation. He denies abdominal pain vomiting melena. He denies dysuria hematuria frequency or urgency. He reports getting blood work done yesterday due to these persistent symptoms and was called today by his PCP and told to report to the emergency department for a high potassium.  Workup in the emergency department includes comprehensive metabolic panel significant for, potassium of 6.2 chloride 117 CO2 14 serum glucose 104 BUN 35 creatinine 2.76, complete blood count reveals hemoglobin of 11.2 MCV 101.4W BCs a 11.2. Initial troponin is negative. EKG with sinus rhythm first-degree AV block and no acute changes when compared to previous. He is afebrile hemodynamically stable with a blood pressure on the soft side he is not hypoxic.  The emergency department he is provided with 500 mL of normal saline and 15 g of Kayexalate.  Review of Systems:  10 point review of systems complete and all systems are negative except as noted in history of present illness Past Medical History  Diagnosis Date  . Hypertension   . Gout    . Acute renal failure     04/2015   Past Surgical History  Procedure Laterality Date  . Prostate surgery     Social History:  reports that he has never smoked. He does not have any smokeless tobacco history on file. He reports that he does not drink alcohol or use illicit drugs. Patient is independent with ADLs he still drives he lives alone he is a recent widower his wife died approximately a year ago. No Known Allergies  No family history on file. family medical history reviewed and is noncontributory to the admission of this elderly gentleman  Prior to Admission medications   Medication Sig Start Date End Date Taking? Authorizing Provider  Ascorbic Acid (VITAMIN C PO) Take 1 tablet by mouth daily.   Yes Historical Provider, MD  aspirin EC 81 MG tablet Take 81 mg by mouth daily.   Yes Historical Provider, MD  atenolol (TENORMIN) 50 MG tablet Take 50 mg by mouth daily.   Yes Historical Provider, MD  felodipine (PLENDIL) 5 MG 24 hr tablet Take 5 mg by mouth daily.   Yes Historical Provider, MD  lisinopril (PRINIVIL,ZESTRIL) 40 MG tablet Take 40 mg by mouth daily.   Yes Historical Provider, MD   Physical Exam: Filed Vitals:   04/15/15 0906 04/15/15 0930 04/15/15 1000 04/15/15 1030  BP: 1$Rem'23/65 93/62 99/56 'opnj$ 108/57  Pulse: 60 54 53 50  Temp: 98.2 F (36.8 C)     TempSrc: Oral     Resp: $Remo'18  20 14  'btwha$ Height: 5' 9.5" (1.765 m)     Weight: 73.483 kg (162 lb)  SpO2: 98% 97% 98% 98%    Wt Readings from Last 3 Encounters:  04/15/15 73.483 kg (162 lb)  08/21/14 72.576 kg (160 lb)    General:  Appears calm and comfortable slightly pale Eyes: PERRL, normal lids, irises & conjunctiva ENT: grossly normal hearing, mucous membranes of his mouth are pink but dry Neck: no LAD, masses or thyromegaly Cardiovascular: RRR, no m/r/g. No LE edema. Pedal pulses present and palpable Respiratory: CTA bilaterally, no w/r/r. Normal respiratory effort. Abdomen: soft, ntnd positive bowel sounds  throughout Skin: no rash or induration seen on limited exam Musculoskeletal: grossly normal tone BUE/BLE Psychiatric: grossly normal mood and affect, speech fluent and appropriate Neurologic: grossly non-focal. Speech clear facial symmetry           Labs on Admission:  Basic Metabolic Panel:  Recent Labs Lab 04/15/15 0926  NA 139  K 6.2*  CL 117*  CO2 14*  GLUCOSE 104*  BUN 35*  CREATININE 2.76*  CALCIUM 8.7*   Liver Function Tests:  Recent Labs Lab 04/15/15 0926  AST 18  ALT 12*  ALKPHOS 84  BILITOT 0.6  PROT 6.8  ALBUMIN 4.1   No results for input(s): LIPASE, AMYLASE in the last 168 hours. No results for input(s): AMMONIA in the last 168 hours. CBC:  Recent Labs Lab 04/15/15 0926  WBC 11.2*  HGB 11.2*  HCT 35.2*  MCV 101.4*  PLT 212   Cardiac Enzymes:  Recent Labs Lab 04/15/15 0926  TROPONINI <0.03    BNP (last 3 results) No results for input(s): BNP in the last 8760 hours.  ProBNP (last 3 results) No results for input(s): PROBNP in the last 8760 hours.  CBG: No results for input(s): GLUCAP in the last 168 hours.  Radiological Exams on Admission: Dg Chest 2 View  04/15/2015   CLINICAL DATA:  Cough, nasal congestion and sore throat for 2 weeks, history hypertension, gout  EXAM: CHEST  2 VIEW  COMPARISON:  08/21/2014  FINDINGS: Normal heart size and pulmonary vascularity.  Calcified tortuous thoracic aorta.  Emphysematous and minimal bronchitic changes consistent with COPD.  No acute infiltrate, pleural effusion or pneumothorax.  Bones demineralized with scattered degenerative disc disease changes thoracic spine.  IMPRESSION: COPD changes.  No acute abnormalities.   Electronically Signed   By: Lavonia Dana M.D.   On: 04/15/2015 10:57    EKG: Independently reviewed. Sinus rhythm with first-degree AV block  Assessment/Plan Principal Problem:   Acute renal failure associated with metabolic acidosis: Likely related to excessive laxatives in the  setting of ACE inhibitor and dehydration due to decreased by mouth intake. Will hold his ACE inhibitor provide vigorous IV fluids monitor urine output. Will recheck in the morning. No improvement consider renal ultrasound  Active Problems:   Hyperkalemia: Related to above.  He was provided with one dose of Kayexalate in the emergency department. Will repeat this afternoon and then recheck be met at 8 PM. EKG as above. Will monitor closely. If no improvement this evening consider IV insulin with D50.    HTN (hypertension): Blood pressure on the soft side. Likely related to decreased volume in the setting of dehydration. Will hydrate will hold his ACE inhibitor and his beta blocker for now. Will monitor closely and resume his beta blocker when indicated.    Metabolic acidosis: Related to above. Will provide IV fluids. Recheck this evening and in the morning.    Code Status: full DVT Prophylaxis: Family Communication: none present Disposition Plan: home when ready  Time spent: 60  minutes  [facility] Pager [phone]

## 2015-04-15 NOTE — ED Provider Notes (Signed)
CSN: [id number]     Arrival date & time 04/15/15  0847 History   First MD Initiated Contact with Patient 04/15/15 0913     Chief Complaint  Patient presents with  . Abnormal Lab      HPI Pt was seen at 0925. Per pt, c/o unknown onset and persistence of constant "abnormal labs" since yesterday. Pt states he had routine labs drawn for his PMD yesterday. States he was called today and told his "potassium was 7.7" and to come to the ED for further evaluation. Pt states he "just hasn't felt well" for the past 2 weeks, with runny/stuffy nose, post nasal drip and mld cough. Denies fevers, no N/V/D, no abd pain, no CP/SOB, no back pain. Denies hx of elevated potassium or renal issues.    Past Medical History  Diagnosis Date  . Hypertension   . Gout    Past Surgical History  Procedure Laterality Date  . Prostate surgery      History  Substance Use Topics  . Smoking status: Never Smoker   . Smokeless tobacco: Not on file  . Alcohol Use: No    Review of Systems ROS: Statement: All systems negative except as marked or noted in the HPI; Constitutional: Negative for fever and chills. ; ; Eyes: Negative for eye pain, redness and discharge. ; ; ENMT: Negative for ear pain, hoarseness, sore throat. +nasal congestion, sinus pressure and post nasal drip. ; ; Cardiovascular: Negative for chest pain, palpitations, diaphoresis, dyspnea and peripheral edema. ; ; Respiratory: +cough. Negative for wheezing and stridor. ; ; Gastrointestinal: Negative for nausea, vomiting, diarrhea, abdominal pain, blood in stool, hematemesis, jaundice and rectal bleeding. . ; ; Genitourinary: Negative for dysuria, flank pain and hematuria. ; ; Musculoskeletal: Negative for back pain and neck pain. Negative for swelling and trauma.; ; Skin: Negative for pruritus, rash, abrasions, blisters, bruising and skin lesion.; ; Neuro: Negative for headache, lightheadedness and neck stiffness. Negative for weakness, altered level of  consciousness , altered mental status, extremity weakness, paresthesias, involuntary movement, seizure and syncope.      Allergies  Review of patient's allergies indicates no known allergies.  Home Medications   Prior to Admission medications   Medication Sig Start Date End Date Taking? Authorizing Provider  Ascorbic Acid (VITAMIN C PO) Take 1 tablet by mouth daily.    Historical Provider, MD  aspirin EC 81 MG tablet Take 81 mg by mouth daily.    Historical Provider, MD  atenolol (TENORMIN) 50 MG tablet Take 50 mg by mouth daily.    Historical Provider, MD  felodipine (PLENDIL) 5 MG 24 hr tablet Take 5 mg by mouth daily.    Historical Provider, MD  lisinopril (PRINIVIL,ZESTRIL) 40 MG tablet Take 40 mg by mouth daily.    Historical Provider, MD  ondansetron (ZOFRAN) 4 MG tablet Take 1 tablet (4 mg total) by mouth every 8 (eight) hours as needed for nausea or vomiting. 08/22/14   Carylon Perches, MD   BP 123/65 mmHg  Pulse 60  Temp(Src) 98.2 F (36.8 C) (Oral)  Resp 18  Ht 5' 9.5" (1.765 m)  Wt 162 lb (73.483 kg)  BMI 23.59 kg/m2  SpO2 98% Physical Exam  0930: Physical examination:  Nursing notes reviewed; Vital signs and O2 SAT reviewed;  Constitutional: Well developed, Well nourished, Well hydrated, In no acute distress; Head:  Normocephalic, atraumatic; Eyes: EOMI, PERRL, No scleral icterus; ENMT: Mouth and pharynx normal, Mucous membranes moist. +edemetous nasal turbinates bilat with clear rhinorrhea.  Mouth and pharynx without lesions. No tonsillar exudates. No intra-oral edema. No submandibular or sublingual edema. No hoarse voice, no drooling, no stridor. No pain with manipulation of larynx. No trismus. ; Neck: Supple, Full range of motion, No lymphadenopathy; Cardiovascular: Regular rate and rhythm, No gallop; Respiratory: Breath sounds clear & equal bilaterally, No wheezes.  Speaking full sentences with ease, Normal respiratory effort/excursion; Chest: Nontender, Movement normal; Abdomen:  Soft, Nontender, Nondistended, Normal bowel sounds; Genitourinary: No CVA tenderness; Extremities: Pulses normal, No tenderness, No edema, No calf edema or asymmetry.; Neuro: AA&Ox3, Major CN grossly intact.  Speech clear. No gross focal motor or sensory deficits in extremities.; Skin: Color normal, Warm, Dry.   ED Course  Procedures     EKG Interpretation   Date/Time:  Thursday Apr 15 2015 09:45:38 EDT Ventricular Rate:  58 PR Interval:  221 QRS Duration: 102 QT Interval:  380 QTC Calculation: 373 R Axis:   -32 Text Interpretation:  Sinus rhythm with 1st degree A-V block Low voltage,  extremity leads When compared with ECG of 08/30/1999 No significant change  was found Confirmed by [facility]  MD, Nicholos JohnsKATHLEEN [phone]([id number]) on 04/15/2015  10:11:57 AM      MDM  MDM Reviewed: previous chart, nursing note and vitals Reviewed previous: labs and ECG Interpretation: labs, ECG and x-ray   Results for orders placed or performed during the hospital encounter of 04/15/15  Comprehensive metabolic panel  Result Value Ref Range   Sodium 139 135 - 145 mmol/L   Potassium 6.2 (HH) 3.5 - 5.1 mmol/L   Chloride 117 (H) 101 - 111 mmol/L   CO2 14 (L) 22 - 32 mmol/L   Glucose, Bld 104 (H) 65 - 99 mg/dL   BUN 35 (H) 6 - 20 mg/dL   Creatinine, Ser 6.042.76 (H) 0.61 - 1.24 mg/dL   Calcium 8.7 (L) 8.9 - 10.3 mg/dL   Total Protein 6.8 6.5 - 8.1 g/dL   Albumin 4.1 3.5 - 5.0 g/dL   AST 18 15 - 41 U/L   ALT 12 (L) 17 - 63 U/L   Alkaline Phosphatase 84 38 - 126 U/L   Total Bilirubin 0.6 0.3 - 1.2 mg/dL   GFR calc non Af Amer 19 (L) >60 mL/min   GFR calc Af Amer 22 (L) >60 mL/min   Anion gap 8 5 - 15  CBC  Result Value Ref Range   WBC 11.2 (H) 4.0 - 10.5 K/uL   RBC 3.47 (L) 4.22 - 5.81 MIL/uL   Hemoglobin 11.2 (L) 13.0 - 17.0 g/dL   HCT 54.035.2 (L) 98.139.0 - 19.152.0 %   MCV 101.4 (H) 78.0 - 100.0 fL   MCH 32.3 26.0 - 34.0 pg   MCHC 31.8 30.0 - 36.0 g/dL   RDW 47.813.0 29.511.5 - 62.115.5 %   Platelets 212 150 - 400 K/uL   Troponin I  Result Value Ref Range   Troponin I <0.03 <0.031 ng/mL  Urinalysis, Routine w reflex microscopic  Result Value Ref Range   Color, Urine YELLOW YELLOW   APPearance CLEAR CLEAR   Specific Gravity, Urine >1.030 (H) 1.005 - 1.030   pH 5.5 5.0 - 8.0   Glucose, UA NEGATIVE NEGATIVE mg/dL   Hgb urine dipstick NEGATIVE NEGATIVE   Bilirubin Urine NEGATIVE NEGATIVE   Ketones, ur NEGATIVE NEGATIVE mg/dL   Protein, ur NEGATIVE NEGATIVE mg/dL   Urobilinogen, UA 0.2 0.0 - 1.0 mg/dL   Nitrite NEGATIVE NEGATIVE   Leukocytes, UA NEGATIVE NEGATIVE   Dg Chest 2  View 04/15/2015   CLINICAL DATA:  Cough, nasal congestion and sore throat for 2 weeks, history hypertension, gout  EXAM: CHEST  2 VIEW  COMPARISON:  08/21/2014  FINDINGS: Normal heart size and pulmonary vascularity.  Calcified tortuous thoracic aorta.  Emphysematous and minimal bronchitic changes consistent with COPD.  No acute infiltrate, pleural effusion or pneumothorax.  Bones demineralized with scattered degenerative disc disease changes thoracic spine.  IMPRESSION: COPD changes.  No acute abnormalities.   Electronically Signed   By: Ulyses SouthwardMark  Boles M.D.   On: 04/15/2015 10:57    1110:  Judicious IVF bolus and gtt given for elevated BUN/Cr from baseline. Kayexalate given for elevated potassium (no EKG changes compared to previous). Dx and testing d/w pt.  Questions answered.  Verb understanding, agreeable to admit. T/C to [facility] Dr. Conley RollsLe, case discussed, including:  HPI, pertinent PM/SHx, VS/PE, dx testing, ED course and treatment:  Agreeable to admit, requests he will come to the ED for evaluation.       Samuel JesterKathleen Mekala Winger, DO 04/17/15 [phone]

## 2015-04-15 NOTE — ED Notes (Signed)
Dr Conley RollsLe made aware of xray findings.

## 2015-04-15 NOTE — ED Notes (Signed)
Pt states that he has had a cough, nasal congestion, and sore throat for about 2 weeks.  States had routine labs done yesterday per pcp and was told to come to er for evaluation of his potassium.  Denies hx of any renal problems.

## 2015-04-15 NOTE — ED Notes (Signed)
Pt sent over by Dr. Felecia ShellingFanta due to potassium level of 7.7. Pt states he hasn't felt well x 2 weeks, stating productive cough, gray in color x 2-3 days.

## 2015-04-15 NOTE — ED Notes (Signed)
Pt was ambulating to the bathroom for bowel movement and was rushing and tripped and fell to knees.  Pt c/o right knee pain following fall.  Fall witnessed by myself and L. Roxan Hockeyobinson RN.  Pt did not hit head or anything other than knees.  MD made aware.

## 2015-04-15 NOTE — ED Notes (Signed)
CRITICAL VALUE ALERT  Critical value received:Potassium 6.2  Date of notification:  04/15/2015  Time of notification:  1009  Critical value read back:Yes.    Nurse who received alert:  Berdine DanceMandi Karesha Trzcinski RN  MD notified (1st page):  Clarene DukeMcManus  Time of first page:  1009  MD notified (2nd page):  Time of second page:  Responding MD:  Clarene DukeMcManus  Time MD responded:  1009

## 2015-04-15 NOTE — Progress Notes (Addendum)
CRITICAL VALUE ALERT  Critical value received:  Potassium 6.3  Date of notification: 04/15/15  Time of notification:  2030  Critical value read back: yes  Nurse who received alert:  L. Junita Pushodgers, RN  MD notified (1st page): Benedetto Coons. Callahan (Midlevel)  Time of first page:  2050  MD notified (2nd page):  Time of second page:  Responding MD:  Benedetto Coons. Callahan (midlevel)  Time MD responded:  2051 new orders received.

## 2015-04-16 DIAGNOSIS — S82044A Nondisplaced comminuted fracture of right patella, initial encounter for closed fracture: Secondary | ICD-10-CM

## 2015-04-16 LAB — CBC
HCT: 32.6 % — ABNORMAL LOW (ref 39.0–52.0)
HEMOGLOBIN: 10.7 g/dL — AB (ref 13.0–17.0)
MCH: 33.2 pg (ref 26.0–34.0)
MCHC: 32.8 g/dL (ref 30.0–36.0)
MCV: 101.2 fL — ABNORMAL HIGH (ref 78.0–100.0)
Platelets: 175 10*3/uL (ref 150–400)
RBC: 3.22 MIL/uL — AB (ref 4.22–5.81)
RDW: 12.7 % (ref 11.5–15.5)
WBC: 13.3 10*3/uL — ABNORMAL HIGH (ref 4.0–10.5)

## 2015-04-16 LAB — COMPREHENSIVE METABOLIC PANEL
ALK PHOS: 74 U/L (ref 38–126)
ALT: 11 U/L — ABNORMAL LOW (ref 17–63)
ANION GAP: 8 (ref 5–15)
AST: 17 U/L (ref 15–41)
Albumin: 3.5 g/dL (ref 3.5–5.0)
BILIRUBIN TOTAL: 0.6 mg/dL (ref 0.3–1.2)
BUN: 30 mg/dL — ABNORMAL HIGH (ref 6–20)
CHLORIDE: 116 mmol/L — AB (ref 101–111)
CO2: 14 mmol/L — AB (ref 22–32)
CREATININE: 2.08 mg/dL — AB (ref 0.61–1.24)
Calcium: 8.2 mg/dL — ABNORMAL LOW (ref 8.9–10.3)
GFR calc Af Amer: 32 mL/min — ABNORMAL LOW (ref >60)
GFR, EST NON AFRICAN AMERICAN: 27 mL/min — AB (ref >60)
Glucose, Bld: 107 mg/dL — ABNORMAL HIGH (ref 65–99)
Potassium: 5.3 mmol/L — ABNORMAL HIGH (ref 3.5–5.1)
Sodium: 138 mmol/L (ref 135–145)
Total Protein: 6 g/dL — ABNORMAL LOW (ref 6.5–8.1)

## 2015-04-16 MED ORDER — SODIUM CHLORIDE 0.9 % IV SOLN
INTRAVENOUS | Status: DC
  Administered 2015-04-16: 10:00:00 via INTRAVENOUS

## 2015-04-16 NOTE — Consult Note (Addendum)
Patient ID: Robert Cardenas, male   DOB: 09/03/1926, 81 y.o.   MRN: [id number]  Chief Complaint  Patient presents with  . Abnormal Lab   Chief complaint rate related to my visit is right knee pain  Requesting physician Dr. Carylon Perchesoy Fagan  Reason for consultation evaluation right patella fracture  Data Reviewed The patient's history and physical and emergency room records were reviewed. I reviewed his x-ray. I interpret his x-ray is a nondisplaced patella fracture of the right knee  Assessment Patellar fracture right knee closed Plan Knee immobilizer for 6 weeks, weight-bear as tolerated X-rays will be needed at 2 weeks and 6 weeks. At 6 weeks hinged knee brace can be placed 0-90    Robert Cardenas is a 81 y.o. male.   HPI This elderly male fell on his day of admission landing on his right knee, apparently he missed adjusted his medications and became hyperkalemic and had to be admitted for correction of that an evaluation of his patella fracture which caused him to not be able to weight-bear. He complains of pain over the right patella which is nonradiating but severe enough to cause him to have trouble moving his leg. The pain is constant and it is unrelieved by Tylenol it is exacerbated by weightbearing Review of Systems Nerve system is normal psychiatric system is normal constitutional symptoms are normal  Past Medical History  Diagnosis Date  . Hypertension   . Gout   . Acute renal failure     04/2015    Past Surgical History  Procedure Laterality Date  . Prostate surgery      History reviewed. No pertinent family history.  Social History History  Substance Use Topics  . Smoking status: Never Smoker   . Smokeless tobacco: Not on file  . Alcohol Use: No    No Known Allergies  Current Facility-Administered Medications  Medication Dose Route Frequency Provider Last Rate Last Dose  . 0.9 %  sodium chloride infusion   Intravenous Continuous Carylon Perchesoy Fagan, MD      .  acetaminophen (TYLENOL) tablet 650 mg  650 mg Oral Q6H PRN Gwenyth BenderKaren M Black, NP   650 mg at 04/15/15 1742   Or  . acetaminophen (TYLENOL) suppository 650 mg  650 mg Rectal Q6H PRN Gwenyth BenderKaren M Black, NP      . alum & mag hydroxide-simeth (MAALOX/MYLANTA) 200-200-20 MG/5ML suspension 30 mL  30 mL Oral Q6H PRN Gwenyth BenderKaren M Black, NP      . aspirin EC tablet 81 mg  81 mg Oral Daily Lesle ChrisKaren M Black, NP   81 mg at 04/15/15 1430  . enoxaparin (LOVENOX) injection 30 mg  30 mg Subcutaneous Q24H Carylon Perchesoy Fagan, MD      . felodipine (PLENDIL) 24 hr tablet 5 mg  5 mg Oral Daily Lesle ChrisKaren M Black, NP   5 mg at 04/15/15 1600  . HYDROcodone-acetaminophen (NORCO/VICODIN) 5-325 MG per tablet 1-2 tablet  1-2 tablet Oral Q4H PRN Gwenyth BenderKaren M Black, NP   1 tablet at 04/15/15 2122  . ondansetron (ZOFRAN) tablet 4 mg  4 mg Oral Q6H PRN Gwenyth BenderKaren M Black, NP       Or  . ondansetron [facility](ZOFRAN) injection 4 mg  4 mg Intravenous Q6H PRN Lesle ChrisKaren M Black, NP      . sodium chloride 0.9 % injection 3 mL  3 mL Intravenous Q12H Lesle ChrisKaren M Black, NP   3 mL at 04/15/15 1430  . traZODone (DESYREL) tablet 25 mg  25 mg  Oral QHS PRN Gwenyth BenderKaren M Black, NP           Physical Exam Blood pressure 108/62, pulse 58, temperature 98.4 F (36.9 C), temperature source Oral, resp. rate 17, height 5\' 10"  (1.778 m), weight 157 lb 13.6 oz (71.6 kg), SpO2 96 %. Physical Exam The patient is well developed well nourished and well groomed. Orientation to person place and time is normal  Mood is pleasant. Ambulatory status currently cant ambulate. I examined his right leg and his neurovascular status remains intact with minimal if any peripheral edema. Skin over the patella intact without laceration quadriceps muscle tone normal he has weak straight leg raise in a week terminal knee extension. Knee flexion approximate 40 passive range of motion 40-10 degrees tenderness and swelling around the patella

## 2015-04-16 NOTE — Clinical Social Work Note (Signed)
Clinical Social Work Assessment  Patient Details  Name: RAMIEL FORTI MRN: [id number] Date of Birth: [birth year]  Date of referral:  04/16/15               Reason for consult:  Facility Placement                Permission sought to share information with:    Permission granted to share information::     Name::        Agency::     Relationship::     Contact Information:     Housing/Transportation Living arrangements for the past 2 months:  Single Family Home Source of Information:  Patient Patient Interpreter Needed:  None Criminal Activity/Legal Involvement Pertinent to Current Situation/Hospitalization:  No - Comment as needed Significant Relationships:  Other Family Members, Adult Children Lives with:  Self Do you feel safe going back to the place where you live?  No (Will need short term rehab prior to return as he lives alone) Need for family participation in patient care:  Yes (Comment) (pt may require additional assist after d/c from SNF)  Care giving concerns:  Pt lives alone.    Social Worker assessment / plan:  CSW met with pt at bedside. Pt alert and oriented and reports he lives alone. His son and several nieces live nearby and are there if pt needs anything. Pt reports that he had labs drawn and was called by PCP to come to ED for admission due to potassium level. While in ED, pt said he fell and hit his knee. He has right patella fracture. At baseline, pt is completely independent and still driving. PT evaluated pt and recommend SNF at d/c. CSW provided SNF list and discussed placement process, including [facility] Medicare authorization. He requests to stay as close to [facility] as possible. Pt is eager to go to rehab so he can get home as soon as possible. CSW will initiate authorization and bed search.   Employment status:  Retired Nurse, adult PT Recommendations:  [place] / Referral to community resources:  [place]  Patient/Family's Response to care:  Pt agreeable to SNF prior to return home.   Patient/Family's Understanding of and Emotional Response to Diagnosis, Current Treatment, and Prognosis:  Pt understands diagnosis and need for SNF as part of treatment plan.   Emotional Assessment Appearance:  Appears younger than stated age Attitude/Demeanor/Rapport:   (cooperative) Affect (typically observed):  Stable, Appropriate Orientation:  Oriented to Self, Oriented to Place, Oriented to  Time, Oriented to Situation Alcohol / Substance use:  Not Applicable Psych involvement (Current and /or in the community):  No (Comment)  Discharge Needs  Concerns to be addressed:  Discharge Planning Concerns Readmission within the last 30 days:  No Current discharge risk:  Lives alone Barriers to Discharge:  Continued Medical Work up   [employer], [employer], [place] 04/16/2015, 4:01 PM [phone]

## 2015-04-16 NOTE — Care Management Note (Signed)
Case Management Note  Patient Details  Name: Robert Cardenas MRN: [id number] Date of Birth: 09/25/1926  Subjective/Objective:                    Action/Plan:   Expected Discharge Date:  04/17/15               Expected Discharge Plan:  Home w Home Health Services  In-House Referral:  NA  Discharge planning Services  CM Consult  Post Acute Care Choice:    Choice offered to:     DME Arranged:    DME Agency:     HH Arranged:    HH Agency:     Status of Service:  Completed, signed off  Medicare Important Message Given:  Yes Date Medicare IM Given:  04/16/15 Medicare IM give by:  Arlyss Queenammy Dorman Calderwood, RN BSN CM Date Additional Medicare IM Given:    Additional Medicare Important Message give by:     If discussed at Long Length of Stay Meetings, dates discussed:    Additional Comments: PT recommends SNF. CSW is aware and will start bed search. Anticipate discharge over the weekend. Arlyss QueenBlackwell, Kira Hartl Tarentumrowder, RN 04/16/2015, 2:57 PM

## 2015-04-16 NOTE — Care Management Note (Signed)
Case Management Note  Patient Details  Name: Robert Cardenas MRN: [id number] Date of Birth: 01/16/1926  Subjective/Objective:                  Pt admitted from home with AKI. Pt lives alone and will return home at discharge. Pt had been independent with ADL's. Pt did state he has numerous canes at home but does not use them.  Action/Plan: Will continue to follow for discharge planning needs.  Expected Discharge Date:  04/17/15               Expected Discharge Plan:  Home w Home Health Services  In-House Referral:  NA  Discharge planning Services  CM Consult  Post Acute Care Choice:    Choice offered to:     DME Arranged:    DME Agency:     HH Arranged:    HH Agency:     Status of Service:  In process, will continue to follow  Medicare Important Message Given:  Yes Date Medicare IM Given:  04/16/15 Medicare IM give by:  Arlyss Queenammy Quinnlyn Hearns, RN BSN CM Date Additional Medicare IM Given:    Additional Medicare Important Message give by:     If discussed at Long Length of Stay Meetings, dates discussed:    Additional Comments:  Cheryl FlashBlackwell, Marcelene Weidemann Crowder, RN 04/16/2015, 10:40 AM

## 2015-04-16 NOTE — Evaluation (Signed)
Physical Therapy Evaluation Patient Details Name: Robert Cardenas MRN: [id number] DOB: 11/09/1926 Today's Date: 04/16/2015   History of Present Illness  Pt is an 81 year old male who is admitted with hypokalemia, acute renal failure and metabolic acidosis.  He sustained a fall while in the ED and now has a fx of the right patella.  Pt lives alone and had been independent with ADLs PTA.  Clinical Impression   Pt was seen for evaluation.  He was alert and oriented, very pleasant and cooperative.  He reported mod pain in the Left knee for which pain meds were requested.  An immobilizer was on his RLE correctly and pt was instructed that it was to be worn at all times.  He was instructed in transfers from bed and chair.  This is quite difficult for him as he no longer can use his RLE to assist.  He is unable to ambulate due to pain with weight bearing.  I am recommending SNF at d/c.    Follow Up Recommendations SNF    Equipment Recommendations  Rolling walker with 5" wheels    Recommendations for Other Services   none    Precautions / Restrictions Precautions Precautions: Fall Required Braces or Orthoses: Knee Immobilizer - Right Knee Immobilizer - Right: On at all times Restrictions Weight Bearing Restrictions: No Other Position/Activity Restrictions: WBAT right LE      Mobility  Bed Mobility Overal bed mobility: Modified Independent                Transfers Overall transfer level: Needs assistance Equipment used: Rolling walker (2 wheeled) Transfers: Sit to/from [employer]Stand;Stand Pivot Transfers Sit to Stand: Min assist;From elevated surface Stand pivot transfers: Min assist       General transfer comment: has difficulty rising from a low sitting surface...pt able to use walker to pivot to chair...he has significant pain in the right knee with any weight bearing  Ambulation/Gait    Unable                                    Balance                                              Pertinent Vitals/Pain Pain Assessment: Faces Faces Pain Scale: Hurts even more Pain Descriptors / Indicators: Aching Pain Intervention(s): Limited activity within patient's tolerance;Patient requesting pain meds-RN notified    Home Living Family/patient expects to be discharged to:: Skilled nursing facility                      Prior Function Level of Independence: Independent                       Extremity/Trunk Assessment               Lower Extremity Assessment: RLE deficits/detail RLE Deficits / Details: RLE in an immobilizer which limits functional mobility       Communication   Communication: HOH  Cognition Arousal/Alertness: Awake/alert Behavior During Therapy: WFL for tasks assessed/performed Overall Cognitive Status: Within Functional Limits for tasks assessed                            Exercises  General Exercises - Lower Extremity Ankle Circles/Pumps: AROM;Left;10 reps;Supine Quad Sets: AROM;Left;5 reps;Supine (only within pain tolerance)      Assessment/Plan    PT Assessment Patient needs continued PT services  PT Diagnosis Difficulty walking;Acute pain   PT Problem List Decreased activity tolerance;Decreased mobility;Pain;Decreased knowledge of use of DME;Decreased safety awareness  PT Treatment Interventions DME instruction;Gait training;Functional mobility training   PT Goals (Current goals can be found in the Care Plan section) Acute Rehab PT Goals Patient Stated Goal: return to independence PT Goal Formulation: With patient Time For Goal Achievement: 04/30/15 Potential to Achieve Goals: Good    Frequency Min 6X/week   Barriers to discharge Decreased caregiver support lives alone    Co-evaluation               End of Session Equipment Utilized During Treatment: Gait belt Activity Tolerance: Patient limited by pain Patient left: in chair;with call  bell/phone within reach;with chair alarm set Nurse Communication: Mobility status         Time: 1414-1440 PT Time Calculation (min) (ACUTE ONLY): 26 min   Charges:   PT Evaluation $Initial PT Evaluation Tier I: 1 Procedure     PT G CodesMyrlene Broker:        Tyerra Loretto L 04/16/2015, 2:55 PM

## 2015-04-16 NOTE — Clinical Social Work Note (Signed)
[facility]. [facility], but not received yet. With possible d/c tomorrow per MD, 7 day letter of guarantee was approved and [facility] agree[facility]. Pt states he plans to discuss SNF with his son tonight.   [name], LCSW [phone]

## 2015-04-16 NOTE — Progress Notes (Signed)
UR chart review completed.  

## 2015-04-16 NOTE — Clinical Social Work Placement (Signed)
   CLINICAL SOCIAL WORK PLACEMENT  NOTE  Date:  04/16/2015  Patient Details  Name: Robert Cardenas MRN: [id number] Date of Birth: 05/17/1926  Clinical Social Work is seeking post-discharge placement for this patient at the Skilled  Nursing Facility level of care (*CSW will initial, date and re-position this form in  chart as items are completed):  Yes   Patient/family provided with [facility] Clinical Social Work Department's list of facilities offering this level of care within the geographic area requested by the patient (or if unable, by the patient's family).  Yes   Patient/family informed of their freedom to choose among providers that offer the needed level of care, that participate in Medicare, Medicaid or managed care program needed by the patient, have an available bed and are willing to accept the patient.  Yes   Patient/family informed of 's ownership interest in [facility] and [facility], as well as of the fact that they are under no obligation to receive care at these facilities.  PASRR submitted to EDS on 04/16/15     PASRR number received on 04/16/15     Existing PASRR number confirmed on       FL2 transmitted to all facilities in geographic area requested by pt/family on 04/16/15     FL2 transmitted to all facilities within larger geographic area on       Patient informed that his/her managed care company has contracts with or will negotiate with certain facilities, including the following:        Yes   Patient/family informed of bed offers received.  [facility]     Physician recommends and patient chooses bed at      Patient to be transferred to   on  .  Patient to be transferred to facility by       Patient family notified on   of transfer.  Name of family member notified:        PHYSICIAN       Additional Comment:    _______________________________________________ Karn CassisStultz, Ehsan Corvin Shanaberger,  LCSW 04/16/2015, 4:39 PM [phone]

## 2015-04-16 NOTE — Progress Notes (Signed)
Subjective: [name] was admitted yesterday with acute renal failure and hyperkalemia. He has been treated with IV fluids. He has been treated with Kayexalate. He received glucose and insulin last night. He fell yesterday in the emergency room and suffered a comminuted fracture of his right patella.  Objective: Vital signs in last 24 hours: Filed Vitals:   04/15/15 1322 04/15/15 1429 04/15/15 2255 04/16/15 0551  BP:  104/58 110/62 108/62  Pulse:  53 55 58  Temp: 98.1 F (36.7 C) 97.8 F (36.6 C) 98.5 F (36.9 C) 98.4 F (36.9 C)  TempSrc: Oral Oral Oral Oral  Resp:   20 17  Height:  5\' 10"  (1.778 m)    Weight:  160 lb 4.8 oz (72.712 kg)  157 lb 13.6 oz (71.6 kg)  SpO2:  98% 98% 96%   Weight change:   Intake/Output Summary (Last 24 hours) at 04/16/15 0739 Last data filed at 04/16/15 0643  Gross per 24 hour  Intake    845 ml  Output    800 ml  Net     45 ml    Physical Exam: Alert. No distress. Lungs clear. Heart regular with no murmurs. Abdomen is soft and nontender. Right knee is in a brace.  Lab Results:    Results for orders placed or performed during the hospital encounter of 04/15/15 (from the past 24 hour(s))  Comprehensive metabolic panel     Status: Abnormal   Collection Time: 04/15/15  9:26 AM  Result Value Ref Range   Sodium 139 135 - 145 mmol/L   Potassium 6.2 (HH) 3.5 - 5.1 mmol/L   Chloride 117 (H) 101 - 111 mmol/L   CO2 14 (L) 22 - 32 mmol/L   Glucose, Bld 104 (H) 65 - 99 mg/dL   BUN 35 (H) 6 - 20 mg/dL   Creatinine, Ser 1.612.76 (H) 0.61 - 1.24 mg/dL   Calcium 8.7 (L) 8.9 - 10.3 mg/dL   Total Protein 6.8 6.5 - 8.1 g/dL   Albumin 4.1 3.5 - 5.0 g/dL   AST 18 15 - 41 U/L   ALT 12 (L) 17 - 63 U/L   Alkaline Phosphatase 84 38 - 126 U/L   Total Bilirubin 0.6 0.3 - 1.2 mg/dL   GFR calc non Af Amer 19 (L) >60 mL/min   GFR calc Af Amer 22 (L) >60 mL/min   Anion gap 8 5 - 15  CBC     Status: Abnormal   Collection Time: 04/15/15  9:26 AM  Result Value Ref  Range   WBC 11.2 (H) 4.0 - 10.5 K/uL   RBC 3.47 (L) 4.22 - 5.81 MIL/uL   Hemoglobin 11.2 (L) 13.0 - 17.0 g/dL   HCT 09.635.2 (L) 04.539.0 - 40.952.0 %   MCV 101.4 (H) 78.0 - 100.0 fL   MCH 32.3 26.0 - 34.0 pg   MCHC 31.8 30.0 - 36.0 g/dL   RDW 81.113.0 91.411.5 - 78.215.5 %   Platelets 212 150 - 400 K/uL  Troponin I     Status: None   Collection Time: 04/15/15  9:26 AM  Result Value Ref Range   Troponin I <0.03 <0.031 ng/mL  Urinalysis, Routine w reflex microscopic     Status: Abnormal   Collection Time: 04/15/15  9:47 AM  Result Value Ref Range   Color, Urine YELLOW YELLOW   APPearance CLEAR CLEAR   Specific Gravity, Urine >1.030 (H) 1.005 - 1.030   pH 5.5 5.0 - 8.0   Glucose, UA NEGATIVE  NEGATIVE mg/dL   Hgb urine dipstick NEGATIVE NEGATIVE   Bilirubin Urine NEGATIVE NEGATIVE   Ketones, ur NEGATIVE NEGATIVE mg/dL   Protein, ur NEGATIVE NEGATIVE mg/dL   Urobilinogen, UA 0.2 0.0 - 1.0 mg/dL   Nitrite NEGATIVE NEGATIVE   Leukocytes, UA NEGATIVE NEGATIVE  Influenza panel by PCR (type A & B, H1N1)     Status: None   Collection Time: 04/15/15  2:54 PM  Result Value Ref Range   Influenza A By PCR NEGATIVE NEGATIVE   Influenza B By PCR NEGATIVE NEGATIVE   H1N1 flu by pcr NOT DETECTED NOT DETECTED  Basic metabolic panel     Status: Abnormal   Collection Time: 04/15/15  7:34 PM  Result Value Ref Range   Sodium 138 135 - 145 mmol/L   Potassium 6.3 (HH) 3.5 - 5.1 mmol/L   Chloride 114 (H) 101 - 111 mmol/L   CO2 17 (L) 22 - 32 mmol/L   Glucose, Bld 137 (H) 65 - 99 mg/dL   BUN 35 (H) 6 - 20 mg/dL   Creatinine, Ser 4.69 (H) 0.61 - 1.24 mg/dL   Calcium 8.5 (L) 8.9 - 10.3 mg/dL   GFR calc non Af Amer 21 (L) >60 mL/min   GFR calc Af Amer 24 (L) >60 mL/min   Anion gap 7 5 - 15  Glucose, capillary     Status: Abnormal   Collection Time: 04/15/15  9:41 PM  Result Value Ref Range   Glucose-Capillary 291 (H) 65 - 99 mg/dL  CBC     Status: Abnormal   Collection Time: 04/16/15  6:00 AM  Result Value Ref  Range   WBC 13.3 (H) 4.0 - 10.5 K/uL   RBC 3.22 (L) 4.22 - 5.81 MIL/uL   Hemoglobin 10.7 (L) 13.0 - 17.0 g/dL   HCT 62.9 (L) 52.8 - 41.3 %   MCV 101.2 (H) 78.0 - 100.0 fL   MCH 33.2 26.0 - 34.0 pg   MCHC 32.8 30.0 - 36.0 g/dL   RDW 24.4 01.0 - 27.2 %   Platelets 175 150 - 400 K/uL     ABGS No results for input(s): PHART, PO2ART, TCO2, HCO3 in the last 72 hours.  Invalid input(s): PCO2 CULTURES No results found for this or any previous visit (from the past 240 hour(s)). Studies/Results: Dg Chest 2 View  04/15/2015   CLINICAL DATA:  Cough, nasal congestion and sore throat for 2 weeks, history hypertension, gout  EXAM: CHEST  2 VIEW  COMPARISON:  08/21/2014  FINDINGS: Normal heart size and pulmonary vascularity.  Calcified tortuous thoracic aorta.  Emphysematous and minimal bronchitic changes consistent with COPD.  No acute infiltrate, pleural effusion or pneumothorax.  Bones demineralized with scattered degenerative disc disease changes thoracic spine.  IMPRESSION: COPD changes.  No acute abnormalities.   Electronically Signed   By: Ulyses Southward M.D.   On: 04/15/2015 10:57   Dg Knee Complete 4 Views Right  04/15/2015   CLINICAL DATA:  Right knee pain secondary to a fall today.  EXAM: RIGHT KNEE - COMPLETE 4+ VIEW  COMPARISON:  None.  FINDINGS: There is a comminuted minimally distracted fracture of the patella. There is a small joint effusion. There is moderately severe osteoarthritis of the knee joint, most prominent in the medial and patellofemoral compartments.  IMPRESSION: Comminuted fracture of the patella.  Osteoarthritis.   Electronically Signed   By: Francene Boyers M.D.   On: 04/15/2015 13:06   Micro Results: No results found for this or  any previous visit (from the past 240 hour(s)). Studies/Results: Dg Chest 2 View  04/15/2015   CLINICAL DATA:  Cough, nasal congestion and sore throat for 2 weeks, history hypertension, gout  EXAM: CHEST  2 VIEW  COMPARISON:  08/21/2014  FINDINGS:  Normal heart size and pulmonary vascularity.  Calcified tortuous thoracic aorta.  Emphysematous and minimal bronchitic changes consistent with COPD.  No acute infiltrate, pleural effusion or pneumothorax.  Bones demineralized with scattered degenerative disc disease changes thoracic spine.  IMPRESSION: COPD changes.  No acute abnormalities.   Electronically Signed   By: Ulyses SouthwardMark  Boles M.D.   On: 04/15/2015 10:57   Dg Knee Complete 4 Views Right  04/15/2015   CLINICAL DATA:  Right knee pain secondary to a fall today.  EXAM: RIGHT KNEE - COMPLETE 4+ VIEW  COMPARISON:  None.  FINDINGS: There is a comminuted minimally distracted fracture of the patella. There is a small joint effusion. There is moderately severe osteoarthritis of the knee joint, most prominent in the medial and patellofemoral compartments.  IMPRESSION: Comminuted fracture of the patella.  Osteoarthritis.   Electronically Signed   By: Francene BoyersJames  Maxwell M.D.   On: 04/15/2015 13:06   Medications:  I have reviewed the patient's current medications Scheduled Meds: . aspirin EC  81 mg Oral Daily  . enoxaparin (LOVENOX) injection  30 mg Subcutaneous Q24H  . felodipine  5 mg Oral Daily  . sodium chloride  3 mL Intravenous Q12H   Continuous Infusions: . sodium chloride     PRN Meds:.acetaminophen **OR** acetaminophen, alum & mag hydroxide-simeth, HYDROcodone-acetaminophen, ondansetron **OR** ondansetron (ZOFRAN) IV, traZODone   Assessment/Plan: #1. Acute renal failure due to dehydration from self-induced diarrhea taking Ex-Lax at home. This morning's electrolytes are pending. Continue IV fluids. #2. Hyperkalemia. Recheck pending. #3. Right patella fracture. Consult orthopedics. Principal Problem:   Acute renal failure Active Problems:   HTN (hypertension)   Metabolic acidosis   Hyperkalemia     LOS: 1 day   Mckaela Howley 04/16/2015, 7:39 AM

## 2015-04-16 NOTE — Plan of Care (Signed)
Confusion over how fall occurred in ED:  After discussing fall with patient, son confused as to how "we" let him fall? RN explained that, per ED, notes, client needed to have BM and was assisted to bathroom by RN. While ambulating, client tripped and landed on Rt knee. Then ED wheeled patient to xray, since complaining of pain.  Son didn't understand why we didn't use a WC to get patient to restroom and that "we caused his fall." RN explained that patient came in for a kidney problem and the ED will generally encourage patients to walk if possible. Also, not stated to son, chart indicates that patient is from home alone...therefore ambulates by self.  While RN was discussing concerns with son, Martie LeeSabrina (Patient care) was also in room.

## 2015-04-17 ENCOUNTER — Inpatient Hospital Stay
Admission: RE | Admit: 2015-04-17 | Discharge: 2015-05-07 | Disposition: A | Discharge status: Home / Self Care | Payer: Medicare Other | Source: Ambulatory Visit | Attending: Internal Medicine | Admitting: Internal Medicine

## 2015-04-17 DIAGNOSIS — R52 Pain, unspecified: Principal | ICD-10-CM

## 2015-04-17 LAB — BASIC METABOLIC PANEL
Anion gap: 7 (ref 5–15)
BUN: 26 mg/dL — ABNORMAL HIGH (ref 6–20)
CO2: 15 mmol/L — ABNORMAL LOW (ref 22–32)
Calcium: 8 mg/dL — ABNORMAL LOW (ref 8.9–10.3)
Chloride: 116 mmol/L — ABNORMAL HIGH (ref 101–111)
Creatinine, Ser: 1.79 mg/dL — ABNORMAL HIGH (ref 0.61–1.24)
GFR calc Af Amer: 38 mL/min — ABNORMAL LOW (ref >60)
GFR calc non Af Amer: 33 mL/min — ABNORMAL LOW (ref >60)
Glucose, Bld: 93 mg/dL (ref 65–99)
Potassium: 4.7 mmol/L (ref 3.5–5.1)
SODIUM: 138 mmol/L (ref 135–145)

## 2015-04-17 MED ORDER — ACETAMINOPHEN 325 MG PO TABS: 650.0000 mg | ORAL_TABLET | Freq: Four times a day (QID) | ORAL | Status: AC | PRN

## 2015-04-17 MED ORDER — HYDROCODONE-ACETAMINOPHEN 5-325 MG PO TABS: 1.0000 | ORAL_TABLET | ORAL | Status: DC | PRN

## 2015-04-17 NOTE — Discharge Summary (Signed)
Physician Discharge Summary  Loyal GamblerRichard R Kreher ZOX:[id number] DOB: 10/30/1926 DOA: 04/15/2015   Admit date: 04/15/2015 Discharge date: 04/17/2015  Discharge Diagnoses: #1. Acute renal failure. #2. Hyperkalemia. #3. Comminuted right patellar fracture. #4. Chronic kidney disease stage III. #5. Hypertension. #6. Metabolic acidosis. Principal Problem:   Acute renal failure Active Problems:   HTN (hypertension)   Metabolic acidosis   Hyperkalemia    Wt Readings from Last 3 Encounters:  04/17/15 157 lb 3 oz (71.3 kg)  08/21/14 160 lb (72.576 kg)     Hospital Course:  This patient is an 81 year old male who was found to have a potassium of 7.7 with an elevation of his creatinine to 2.7. Laboratory studies were obtained routinely prior to an office visit. The patient had been experiencing diarrhea after using Ex-Lax which he stated he used to clear up some congestion in his head. He was sent to the emergency room after his laboratory studies were obtained and he was treated with IV fluids, Kayexalate, insulin, dextrose and calcium. His potassium improved to 6.3 and then 5.3. His baseline creatinine is in the 1.6-2.0 range. His EKG revealed slightly peaked T waves. His renal function improved with IV hydration with his creatinine returning to 2.0. He felt much better. He unfortunately fell while in the emergency room and suffered a comminuted fracture to his right patella. He was seen in consultation by Dr. Romeo AppleHarrison. A knee immobilizer was placed. Due to the patient's age and infirmity it was not realistic to discharge him back home and arrangements were made for [facility]. He will be seen in follow-up there.  His condition at discharge is much improved. His ACE inhibitor therapy with lisinopril will be held for now. His prior potassium 4 months ago was 4.6.  [facility]. His knee pain will be treated with acetaminophen or  hydrocodone if needed.  Please obtain a repeat metabolic profile on May 17.   Discharge Instructions     Medication List    STOP taking these medications        lisinopril 40 MG tablet  Commonly known as:  PRINIVIL,ZESTRIL     VITAMIN C PO      TAKE these medications        acetaminophen 325 MG tablet  Commonly known as:  TYLENOL  Take 2 tablets (650 mg total) by mouth every 6 (six) hours as needed for mild pain (or Fever >/= 101).     aspirin EC 81 MG tablet  Take 81 mg by mouth daily.     atenolol 50 MG tablet  Commonly known as:  TENORMIN  Take 50 mg by mouth daily.     felodipine 5 MG 24 hr tablet  Commonly known as:  PLENDIL  Take 5 mg by mouth daily.     HYDROcodone-acetaminophen 5-325 MG per tablet  Commonly known as:  NORCO/VICODIN  Take 1-2 tablets by mouth every 4 (four) hours as needed for moderate pain.         Blakelynn Scheeler 04/17/2015

## 2015-04-17 NOTE — Plan of Care (Signed)
[facility] being [facility]. [facility] and spoke with supervisor. IV removed. VSS. [facility]

## 2015-04-20 ENCOUNTER — Encounter (HOSPITAL_COMMUNITY)
Admission: RE | Admit: 2015-04-20 | Discharge: 2015-04-20 | Disposition: A | Discharge status: Home / Self Care | Payer: Medicare Other | Source: Skilled Nursing Facility | Attending: Internal Medicine | Admitting: Internal Medicine

## 2015-04-20 LAB — BASIC METABOLIC PANEL
ANION GAP: 9 (ref 5–15)
BUN: 55 mg/dL — ABNORMAL HIGH (ref 6–20)
CHLORIDE: 109 mmol/L (ref 101–111)
CO2: 19 mmol/L — ABNORMAL LOW (ref 22–32)
Calcium: 8.4 mg/dL — ABNORMAL LOW (ref 8.9–10.3)
Creatinine, Ser: 2.39 mg/dL — ABNORMAL HIGH (ref 0.61–1.24)
GFR calc Af Amer: 27 mL/min — ABNORMAL LOW (ref >60)
GFR calc non Af Amer: 23 mL/min — ABNORMAL LOW (ref >60)
Glucose, Bld: 96 mg/dL (ref 65–99)
Potassium: 4.5 mmol/L (ref 3.5–5.1)
Sodium: 137 mmol/L (ref 135–145)

## 2015-05-05 ENCOUNTER — Ambulatory Visit (HOSPITAL_COMMUNITY): Payer: Medicare Other | Attending: Internal Medicine

## 2015-05-05 DIAGNOSIS — X58XXXD Exposure to other specified factors, subsequent encounter: Secondary | ICD-10-CM | POA: Diagnosis not present

## 2015-05-05 DIAGNOSIS — S82041D Displaced comminuted fracture of right patella, subsequent encounter for closed fracture with routine healing: Secondary | ICD-10-CM | POA: Diagnosis not present

## 2015-05-06 ENCOUNTER — Ambulatory Visit: Payer: Medicare Other | Admitting: Orthopedic Surgery

## 2015-05-12 ENCOUNTER — Ambulatory Visit (INDEPENDENT_AMBULATORY_CARE_PROVIDER_SITE_OTHER): Payer: Self-pay | Admitting: Orthopedic Surgery

## 2015-05-12 ENCOUNTER — Encounter: Payer: Self-pay | Admitting: Orthopedic Surgery

## 2015-05-12 VITALS — BP 97/59 | Ht 70.0 in | Wt 157.0 lb

## 2015-05-12 DIAGNOSIS — S82001D Unspecified fracture of right patella, subsequent encounter for closed fracture with routine healing: Secondary | ICD-10-CM

## 2015-05-12 NOTE — Progress Notes (Signed)
Chief Complaint  Patient presents with  . Follow-up    hospital follow up Right patella fracture, DOI 04/15/15    Hospital consult follow-up patient had a right transverse patella fracture treated with a long-leg brace. He is doing well he can now bend his knee and straightening from a 90 flexed position. He has no extensor lag. He has no tenderness at the fracture site. His x-ray shows transverse fracture healing appropriately  Patient placed in hinged knee brace continue weightbearing is as tolerated with a walker x-ray in 4 weeks AP and lateral only knee

## 2015-06-10 ENCOUNTER — Ambulatory Visit (INDEPENDENT_AMBULATORY_CARE_PROVIDER_SITE_OTHER): Payer: Medicare Other

## 2015-06-10 ENCOUNTER — Ambulatory Visit (INDEPENDENT_AMBULATORY_CARE_PROVIDER_SITE_OTHER): Payer: Self-pay | Admitting: Orthopedic Surgery

## 2015-06-10 VITALS — BP 128/64 | Ht 70.0 in | Wt 157.0 lb

## 2015-06-10 DIAGNOSIS — S82001D Unspecified fracture of right patella, subsequent encounter for closed fracture with routine healing: Secondary | ICD-10-CM

## 2015-06-10 NOTE — Progress Notes (Signed)
Patient ID: Robert Cardenas, male   DOB: 04/09/1926, 81 y.o.   MRN: [id number] Patient ID: Robert Cardenas, male   DOB: 04/23/1926, 81 y.o.   MRN: [id number]  Follow up visit  Chief Complaint  Patient presents with  . Follow-up    4 week recheck on right knee, patella fracture, DOI 04-15-15.    BP 128/64 mmHg  Ht 5\' 10"  (1.778 m)  Wt 157 lb (71.215 kg)  BMI 22.53 kg/m2  Encounter Diagnosis  Name Primary?  . Patella fracture, right, closed, with routine healing, subsequent encounter Yes    Patella fracture. Patient endplates with cane. No tenderness pain resolved. Range of motion acceptable. Fracture x-rays show healing  Discharge brace can come off

## 2015-07-22 ENCOUNTER — Ambulatory Visit (HOSPITAL_COMMUNITY)
Admission: RE | Admit: 2015-07-22 | Discharge: 2015-07-22 | Disposition: A | Discharge status: Home / Self Care | Payer: Medicare Other | Source: Ambulatory Visit | Attending: Internal Medicine | Admitting: Internal Medicine

## 2015-07-22 ENCOUNTER — Other Ambulatory Visit (HOSPITAL_COMMUNITY): Payer: Self-pay | Admitting: Internal Medicine

## 2015-07-22 DIAGNOSIS — M25561 Pain in right knee: Secondary | ICD-10-CM | POA: Insufficient documentation

## 2015-07-22 DIAGNOSIS — S82041A Displaced comminuted fracture of right patella, initial encounter for closed fracture: Secondary | ICD-10-CM | POA: Diagnosis not present

## 2015-07-22 DIAGNOSIS — W19XXXA Unspecified fall, initial encounter: Secondary | ICD-10-CM | POA: Insufficient documentation

## 2015-09-20 ENCOUNTER — Ambulatory Visit (HOSPITAL_COMMUNITY)
Admission: RE | Admit: 2015-09-20 | Discharge: 2015-09-20 | Disposition: A | Discharge status: Home / Self Care | Payer: Medicare Other | Source: Ambulatory Visit | Attending: Internal Medicine | Admitting: Internal Medicine

## 2015-09-20 ENCOUNTER — Other Ambulatory Visit (HOSPITAL_COMMUNITY): Payer: Self-pay | Admitting: Internal Medicine

## 2015-09-20 DIAGNOSIS — R52 Pain, unspecified: Secondary | ICD-10-CM

## 2015-09-20 DIAGNOSIS — M11262 Other chondrocalcinosis, left knee: Secondary | ICD-10-CM | POA: Diagnosis not present

## 2015-09-20 DIAGNOSIS — I739 Peripheral vascular disease, unspecified: Secondary | ICD-10-CM | POA: Diagnosis not present

## 2015-09-20 DIAGNOSIS — M25461 Effusion, right knee: Secondary | ICD-10-CM | POA: Insufficient documentation

## 2015-09-20 DIAGNOSIS — M25562 Pain in left knee: Secondary | ICD-10-CM | POA: Diagnosis present

## 2015-09-26 ENCOUNTER — Encounter (HOSPITAL_COMMUNITY): Payer: Self-pay | Admitting: Emergency Medicine

## 2015-09-26 ENCOUNTER — Emergency Department (HOSPITAL_COMMUNITY)
Admission: EM | Admit: 2015-09-26 | Discharge: 2015-09-26 | Disposition: A | Discharge status: Home / Self Care | Payer: Medicare Other | Attending: Emergency Medicine | Admitting: Emergency Medicine

## 2015-09-26 ENCOUNTER — Emergency Department (HOSPITAL_COMMUNITY): Payer: Medicare Other

## 2015-09-26 DIAGNOSIS — Z79899 Other long term (current) drug therapy: Secondary | ICD-10-CM | POA: Diagnosis not present

## 2015-09-26 DIAGNOSIS — M10021 Idiopathic gout, right elbow: Secondary | ICD-10-CM | POA: Insufficient documentation

## 2015-09-26 DIAGNOSIS — M109 Gout, unspecified: Secondary | ICD-10-CM

## 2015-09-26 DIAGNOSIS — M10041 Idiopathic gout, right hand: Secondary | ICD-10-CM | POA: Insufficient documentation

## 2015-09-26 DIAGNOSIS — Z87448 Personal history of other diseases of urinary system: Secondary | ICD-10-CM | POA: Diagnosis not present

## 2015-09-26 DIAGNOSIS — Z7982 Long term (current) use of aspirin: Secondary | ICD-10-CM | POA: Insufficient documentation

## 2015-09-26 DIAGNOSIS — I82402 Acute embolism and thrombosis of unspecified deep veins of left lower extremity: Secondary | ICD-10-CM | POA: Diagnosis not present

## 2015-09-26 DIAGNOSIS — M7989 Other specified soft tissue disorders: Secondary | ICD-10-CM | POA: Diagnosis present

## 2015-09-26 DIAGNOSIS — I10 Essential (primary) hypertension: Secondary | ICD-10-CM | POA: Diagnosis not present

## 2015-09-26 LAB — BASIC METABOLIC PANEL
Anion gap: 11 (ref 5–15)
BUN: 32 mg/dL — AB (ref 6–20)
CHLORIDE: 108 mmol/L (ref 101–111)
CO2: 19 mmol/L — ABNORMAL LOW (ref 22–32)
CREATININE: 1.77 mg/dL — AB (ref 0.61–1.24)
Calcium: 8.7 mg/dL — ABNORMAL LOW (ref 8.9–10.3)
GFR calc Af Amer: 38 mL/min — ABNORMAL LOW (ref >60)
GFR calc non Af Amer: 33 mL/min — ABNORMAL LOW (ref >60)
GLUCOSE: 115 mg/dL — AB (ref 65–99)
POTASSIUM: 3.8 mmol/L (ref 3.5–5.1)
Sodium: 138 mmol/L (ref 135–145)

## 2015-09-26 LAB — CBC
HCT: 33.9 % — ABNORMAL LOW (ref 39.0–52.0)
HEMOGLOBIN: 11.2 g/dL — AB (ref 13.0–17.0)
MCH: 31.5 pg (ref 26.0–34.0)
MCHC: 33 g/dL (ref 30.0–36.0)
MCV: 95.2 fL (ref 78.0–100.0)
Platelets: 317 10*3/uL (ref 150–400)
RBC: 3.56 MIL/uL — ABNORMAL LOW (ref 4.22–5.81)
RDW: 14.6 % (ref 11.5–15.5)
WBC: 6.9 10*3/uL (ref 4.0–10.5)

## 2015-09-26 MED ORDER — HYDROCODONE-ACETAMINOPHEN 5-325 MG PO TABS: 1.0000 | ORAL_TABLET | ORAL | Status: DC | PRN

## 2015-09-26 MED ORDER — ENOXAPARIN SODIUM 80 MG/0.8ML ~~LOC~~ SOLN
1.0000 mg/kg | Freq: Once | SUBCUTANEOUS | Status: AC
  Administered 2015-09-26: 75 mg via SUBCUTANEOUS
  Filled 2015-09-26: qty 0.8

## 2015-09-26 MED ORDER — ENOXAPARIN SODIUM 150 MG/ML ~~LOC~~ SOLN: 75.0000 mg | SUBCUTANEOUS | Status: DC

## 2015-09-26 MED ORDER — HYDROCODONE-ACETAMINOPHEN 5-325 MG PO TABS
1.0000 | ORAL_TABLET | Freq: Once | ORAL | Status: DC
  Filled 2015-09-26: qty 1

## 2015-09-26 NOTE — ED Provider Notes (Signed)
CSN: [id number]     Arrival date & time 09/26/15  [id number] History  By signing my name below, I, Evon Slackerrance Branch, attest that this documentation has been prepared under the direction and in the presence of Jerelyn ScottMartha Linker, MD. Electronically Signed: Evon Slackerrance Branch, ED Scribe. 09/26/2015. 1:02 PM.      Chief Complaint  Patient presents with  . Hand Pain  . Leg Swelling   The history is provided by the patient. No language interpreter was used.   HPI Comments: Robert Cardenas is a 81 y.o. male who presents to the Emergency Department complaining of swelling in bilateral lower extremities- left greater than right.  Also has pain in right hand and right elbow. Pt states that the only thing he remembers that may have aggravated his symptoms was trimming some trees around his yard. Pt states that he has been using pain relief cream and tylenol with no relief. Pt states that the elbow pain is worse with flexion. Pt denies numbness, tingling or weakness.  Denies chest pain or shortness of breath.  No fever. Has also taken colchicine yesterday for the pain and states this helped only minimally.  There are no other associated systemic symptoms, there are no other alleviating or modifying factors.    Past Medical History  Diagnosis Date  . Hypertension   . Gout   . Acute renal failure (HCC)     04/2015   Past Surgical History  Procedure Laterality Date  . Prostate surgery     History reviewed. No pertinent family history. Social History  Substance Use Topics  . Smoking status: Never Smoker   . Smokeless tobacco: None  . Alcohol Use: No    Review of Systems  Cardiovascular: Positive for leg swelling.  Musculoskeletal: Positive for joint swelling and arthralgias.  Neurological: Negative for weakness and numbness.  All other systems reviewed and are negative.     Allergies  Review of patient's allergies indicates no known allergies.  Home Medications   Prior to Admission medications    Medication Sig Start Date End Date Taking? Authorizing Provider  acetaminophen (TYLENOL) 325 MG tablet Take 2 tablets (650 mg total) by mouth every 6 (six) hours as needed for mild pain (or Fever >/= 101). 04/17/15  Yes Carylon Perchesoy Fagan, MD  aspirin EC 81 MG tablet Take 81 mg by mouth daily.   Yes Historical Provider, MD  atenolol (TENORMIN) 50 MG tablet Take 50 mg by mouth daily.   Yes Historical Provider, MD  doxazosin (CARDURA) 8 MG tablet Take 8 mg by mouth daily.   Yes Historical Provider, MD  OVER THE COUNTER MEDICATION Apply 1 application topically 2 (two) times daily as needed (hand pain). Lidocaine plus cream (lidocaine hydrochloride 4%0   Yes Historical Provider, MD  OVER THE COUNTER MEDICATION Apply 1 application topically 2 (two) times daily as needed (hand pain). Arthritis hot pain (menthyl salicylate 15% & menthol 10%)   Yes Historical Provider, MD  vitamin C (ASCORBIC ACID) 500 MG tablet Take 500 mg by mouth daily.   Yes Historical Provider, MD  enoxaparin (LOVENOX) 150 MG/ML injection Inject 0.5 mLs (75 mg total) into the skin daily. 09/26/15   Jerelyn ScottMartha Linker, MD  HYDROcodone-acetaminophen (NORCO/VICODIN) 5-325 MG tablet Take 1-2 tablets by mouth every 4 (four) hours as needed for moderate pain. 09/26/15   Jerelyn ScottMartha Linker, MD   BP 115/69 mmHg  Pulse 71  Temp(Src) 98.2 F (36.8 C) (Oral)  Resp 18  Ht 5\' 10"  (1.778 m)  Wt 160 lb (72.576 kg)  BMI 22.96 kg/m2  SpO2 97%  Vitals reviewed Physical Exam  Physical Examination: General appearance - alert, well appearing, and in no distress Mental status - alert, oriented to person, place, and time Eyes - no conjunctival injection, no scleral icterus Mouth - mucous membranes moist, pharynx normal without lesions Chest - clear to auscultation, no wheezes, rales or rhonchi, symmetric air entry Heart - normal rate, regular rhythm, normal S1, S2, no murmurs, rubs, clicks or gallops Abdomen - soft, nontender, nondistended, no masses or  organomegaly Neurological - alert, oriented, normal speech, strength 5/5 in extremities x 4, sensation intact Extremities - peripheral pulses normal, no pedal edema, no clubbing or cyanosis Skin - normal coloration and turgor, no rashes  ED Course  Procedures (including critical care time) DIAGNOSTIC STUDIES: Oxygen Saturation is 98% on RA, normal by my interpretation.    COORDINATION OF CARE: 10:19 AM-Discussed treatment plan with pt at bedside and pt agreed to plan.     Labs Review Labs Reviewed  CBC - Abnormal; Notable for the following:    RBC 3.56 (*)    Hemoglobin 11.2 (*)    HCT 33.9 (*)    All other components within normal limits  BASIC METABOLIC PANEL - Abnormal; Notable for the following:    CO2 19 (*)    Glucose, Bld 115 (*)    BUN 32 (*)    Creatinine, Ser 1.77 (*)    Calcium 8.7 (*)    GFR calc non Af Amer 33 (*)    GFR calc Af Amer 38 (*)    All other components within normal limits    Imaging Review Dg Elbow Complete Right  09/26/2015  CLINICAL DATA:  Elbow pain and swelling, aggravated symptoms after trimming trees in yard, elbow pain worse with flexion EXAM: RIGHT ELBOW - COMPLETE 3+ VIEW COMPARISON:  None FINDINGS: Diffuse osseous demineralization. Joint spaces preserved. No acute fracture, dislocation, or bone destruction. Small calcified bodies noted at the elbow joint anterolaterally. Question minimal elbow joint effusion. IMPRESSION: Minimal elbow joint effusion questioned with small calcified bodies at anterolaterally. No acute osseous abnormalities. Electronically Signed   By: Ulyses Southward M.D.   On: 09/26/2015 11:35   US Venous Img Lower Unilateral Left  09/26/2015  CLINICAL DATA:  Left lower extremity swelling and pain 2 days. Fall 1 week ago. EXAM: Left LOWER EXTREMITY VENOUS DOPPLER ULTRASOUND TECHNIQUE: Gray-scale sonography with graded compression, as well as color Doppler and duplex ultrasound were performed to evaluate the lower extremity deep  venous systems from the level of the common femoral vein and including the common femoral, femoral, profunda femoral, popliteal and calf veins including the posterior tibial, peroneal and gastrocnemius veins when visible. The superficial great saphenous vein was also interrogated. Spectral Doppler was utilized to evaluate flow at rest and with distal augmentation maneuvers in the common femoral, femoral and popliteal veins. COMPARISON:  None. FINDINGS: Contralateral Common Femoral Vein: Respiratory phasicity is normal and symmetric with the symptomatic side. No evidence of thrombus. Normal compressibility. Common Femoral Vein: No evidence of thrombus. Normal compressibility, respiratory phasicity and response to augmentation. Saphenofemoral Junction: No evidence of thrombus. Normal compressibility and flow on color Doppler imaging. Profunda Femoral Vein: No evidence of thrombus. Normal compressibility and flow on color Doppler imaging. Femoral Vein: Evidence of peripheral linear thrombus with partial compression from the proximal to distal femoral vein. Nonocclusive as significant color flow demonstrated. No significant vessel expansion. Popliteal Vein: Evidence of peripheral linear  thrombus with partial compression. Nonocclusive as significant color flow demonstrated. No significant vessel expansion. Calf Veins: No evidence of thrombus. Normal compressibility and flow on color Doppler imaging. Superficial Great Saphenous Vein: No evidence of thrombus. Normal compressibility and flow on color Doppler imaging. Venous Reflux:  None. Other Findings: Baker cyst over the left popliteal fossa measuring 1.3 x 3.9 x 5 cm. IMPRESSION: Nonocclusive thrombus from the proximal femoral vein to the popliteal vein with partial compression. Findings appear to represent a combination of acute and chronic thrombus. These results were called by telephone at the time of interpretation on 09/26/2015 at 11:30 am to Dr. Jerelyn Scott ,  who verbally acknowledged these results. Electronically Signed   By: Elberta Fortis M.D.   On: 09/26/2015 11:26   Dg Hand Complete Right  09/26/2015  CLINICAL DATA:  81 year old male with acute right hand pain swelling for 1 day. History of gout. EXAM: RIGHT HAND - COMPLETE 3+ VIEW COMPARISON:  None. FINDINGS: There is no evidence of acute fracture, subluxation or dislocation. Mild erosive changes with sclerotic edges are identified, most notably at the index finger PIP joint and second metacarpal head with associated soft tissue swelling. Degenerative changes in the wrist are noted. Vascular calcifications are present. IMPRESSION: Erosive changes, most notably at the second metacarpal head and index finger PIP joint. Associated soft tissue swelling. This compatible with an erosive arthropathy -likely gout. Degenerative changes within the wrist and vascular calcifications. Electronically Signed   By: Harmon Pier M.D.   On: 09/26/2015 11:35      EKG Interpretation None      MDM   Final diagnoses:  Acute gout of right hand, unspecified cause  Acute idiopathic gout of right elbow  DVT (deep venous thrombosis), left   Pt presenting with pain in right elbow and hand- xrays and physical exam findings with hx of gout make these areas of pain most c/w gout.  Low suspicion for septic arthritis.  Advised pt against taking colchicine due to his renal impairment- given rx for hydrocodone for pain.  Left lower extremity ultrasound does show DVT.  Given renal insufficiency pt is not candidate for xarelto, d/w pharmacist and together we have decided that lovenox /kg once daily is the best option for him. Pt was given first dose of lovenox in the ED and seemed to be able to follow instructions to do this at home.  He will need close f/u with PMD for further anticoagulation plans.  No chest pain or shortness of breath to suggest PE.  Discharged with strict return precautions.  Pt agreeable with plan.   Julious Payer, personally performed the services described in this documentation. All medical record entries made by the scribe were at my direction and in my presence.  I have reviewed the chart and discharge instructions and agree that the record reflects my personal performance and is accurate and complete. Ethelda Chick.  09/26/2015. 1:05 PM.     pts creatinine clearance is < 30, so not a candidate for xarelto  Jerelyn Scott, MD 09/26/15 1305

## 2015-09-26 NOTE — Discharge Instructions (Signed)
Return to the ED with any concerns including chest pain, difficulty breathing, fever/chills, fainting, increased pain, decreased level of alertness/lethargy, or any other alarming symptoms

## 2015-09-26 NOTE — ED Notes (Addendum)
Patient states "I was trimming some trees and a couple of days later my right hand and both my feet started swelling up." Also complaining of pain to right hand and right elbow. Noted swelling to bilateral lower extremities. Patient ambulatory with a cane at triage. Patient states he took tylenol at 0600 this morning.

## 2016-01-30 ENCOUNTER — Emergency Department (HOSPITAL_COMMUNITY)
Admission: EM | Admit: 2016-01-30 | Discharge: 2016-01-30 | Disposition: A | Discharge status: Home / Self Care | Payer: Medicare Other | Attending: Emergency Medicine | Admitting: Emergency Medicine

## 2016-01-30 ENCOUNTER — Encounter (HOSPITAL_COMMUNITY): Payer: Self-pay

## 2016-01-30 DIAGNOSIS — M109 Gout, unspecified: Secondary | ICD-10-CM | POA: Insufficient documentation

## 2016-01-30 DIAGNOSIS — Z79899 Other long term (current) drug therapy: Secondary | ICD-10-CM | POA: Insufficient documentation

## 2016-01-30 DIAGNOSIS — I10 Essential (primary) hypertension: Secondary | ICD-10-CM | POA: Diagnosis not present

## 2016-01-30 DIAGNOSIS — Z7982 Long term (current) use of aspirin: Secondary | ICD-10-CM | POA: Insufficient documentation

## 2016-01-30 DIAGNOSIS — J029 Acute pharyngitis, unspecified: Secondary | ICD-10-CM | POA: Diagnosis not present

## 2016-01-30 DIAGNOSIS — J069 Acute upper respiratory infection, unspecified: Secondary | ICD-10-CM | POA: Insufficient documentation

## 2016-01-30 DIAGNOSIS — Z87448 Personal history of other diseases of urinary system: Secondary | ICD-10-CM | POA: Insufficient documentation

## 2016-01-30 LAB — RAPID STREP SCREEN (MED CTR MEBANE ONLY): Streptococcus, Group A Screen (Direct): NEGATIVE

## 2016-01-30 MED ORDER — DEXAMETHASONE 4 MG PO TABS
12.0000 mg | ORAL_TABLET | Freq: Once | ORAL | Status: AC
  Administered 2016-01-30: 12 mg via ORAL
  Filled 2016-01-30: qty 3

## 2016-01-30 MED ORDER — TRAMADOL HCL 50 MG PO TABS: 50.0000 mg | ORAL_TABLET | Freq: Four times a day (QID) | ORAL | Status: DC | PRN

## 2016-01-30 NOTE — Discharge Instructions (Signed)
Pharyngitis Pharyngitis is redness, pain, and swelling (inflammation) of your pharynx.  CAUSES  Pharyngitis is usually caused by infection. Most of the time, these infections are from viruses (viral) and are part of a cold. However, sometimes pharyngitis is caused by bacteria (bacterial). Pharyngitis can also be caused by allergies. Viral pharyngitis may be spread from person to person by coughing, sneezing, and personal items or utensils (cups, forks, spoons, toothbrushes). Bacterial pharyngitis may be spread from person to person by more intimate contact, such as kissing.  SIGNS AND SYMPTOMS  Symptoms of pharyngitis include:   Sore throat.   Tiredness (fatigue).   Low-grade fever.   Headache.  Joint pain and muscle aches.  Skin rashes.  Swollen lymph nodes.  Plaque-like film on throat or tonsils (often seen with bacterial pharyngitis). DIAGNOSIS  Your health care provider will ask you questions about your illness and your symptoms. Your medical history, along with a physical exam, is often all that is needed to diagnose pharyngitis. Sometimes, a rapid strep test is done. Other lab tests may also be done, depending on the suspected cause.  TREATMENT  Viral pharyngitis will usually get better in 3-4 days without the use of medicine. Bacterial pharyngitis is treated with medicines that kill germs (antibiotics).  HOME CARE INSTRUCTIONS   Drink enough water and fluids to keep your urine clear or pale yellow.   Only take over-the-counter or prescription medicines as directed by your health care provider:   If you are prescribed antibiotics, make sure you finish them even if you start to feel better.   Do not take aspirin.   Get lots of rest.   Gargle with 8 oz of salt water ( tsp of salt per 1 qt of water) as often as every 1-2 hours to soothe your throat.   Throat lozenges (if you are not at risk for choking) or sprays may be used to soothe your throat. SEEK MEDICAL  CARE IF:   You have large, tender lumps in your neck.  You have a rash.  You cough up green, yellow-brown, or bloody spit. SEEK IMMEDIATE MEDICAL CARE IF:   Your neck becomes stiff.  You drool or are unable to swallow liquids.  You vomit or are unable to keep medicines or liquids down.  You have severe pain that does not go away with the use of recommended medicines.  You have trouble breathing (not caused by a stuffy nose). MAKE SURE YOU:   Understand these instructions.  Will watch your condition.  Will get help right away if you are not doing well or get worse.   This information is not intended to replace advice given to you by your health care provider. Make sure you discuss any questions you have with your health care provider.   Document Released: 11/20/2005 Document Revised: 09/10/2013 Document Reviewed: 07/28/2013 Elsevier Interactive Patient Education 2016 [employer]  Tramadol tablets What is this medicine? TRAMADOL (TRA ma dole) is a pain reliever. It is used to treat moderate to severe pain in adults. This medicine may be used for other purposes; ask your health care provider or pharmacist if you have questions. What should I tell my health care provider before I take this medicine? They need to know if you have any of these conditions: -brain tumor -depression -drug abuse or addiction -head injury -if you frequently drink alcohol containing drinks -kidney disease or trouble passing urine -liver disease -lung disease, asthma, or breathing problems -seizures or epilepsy -suicidal thoughts, plans,  or attempt; a previous suicide attempt by you or a family member -an unusual or allergic reaction to tramadol, codeine, other medicines, foods, dyes, or preservatives -pregnant or trying to get pregnant -breast-feeding How should I use this medicine? Take this medicine by mouth with a full glass of water. Follow the directions on the prescription label. If  the medicine upsets your stomach, take it with food or milk. Do not take more medicine than you are told to take. Talk to your pediatrician regarding the use of this medicine in children. Special care may be needed. Overdosage: If you think you have taken too much of this medicine contact a poison control center or emergency room at once. NOTE: This medicine is only for you. Do not share this medicine with others. What if I miss a dose? If you miss a dose, take it as soon as you can. If it is almost time for your next dose, take only that dose. Do not take double or extra doses. What may interact with this medicine? Do not take this medicine with any of the following medications: -MAOIs like Carbex, Eldepryl, Marplan, Nardil, and Parnate This medicine may also interact with the following medications: -alcohol or medicines that contain alcohol -antihistamines -benzodiazepines -bupropion -carbamazepine or oxcarbazepine -clozapine -cyclobenzaprine -digoxin -furazolidone -linezolid -medicines for depression, anxiety, or psychotic disturbances -medicines for migraine headache like almotriptan, eletriptan, frovatriptan, naratriptan, rizatriptan, sumatriptan, zolmitriptan -medicines for pain like pentazocine, buprenorphine, butorphanol, meperidine, nalbuphine, and propoxyphene -medicines for sleep -muscle relaxants -naltrexone -phenobarbital -phenothiazines like perphenazine, thioridazine, chlorpromazine, mesoridazine, fluphenazine, prochlorperazine, promazine, and trifluoperazine -procarbazine -warfarin This list may not describe all possible interactions. Give your health care provider a list of all the medicines, herbs, non-prescription drugs, or dietary supplements you use. Also tell them if you smoke, drink alcohol, or use illegal drugs. Some items may interact with your medicine. What should I watch for while using this medicine? Tell your doctor or health care professional if your  pain does not go away, if it gets worse, or if you have new or a different type of pain. You may develop tolerance to the medicine. Tolerance means that you will need a higher dose of the medicine for pain relief. Tolerance is normal and is expected if you take this medicine for a long time. Do not suddenly stop taking your medicine because you may develop a severe reaction. Your body becomes used to the medicine. This does NOT mean you are addicted. Addiction is a behavior related to getting and using a drug for a non-medical reason. If you have pain, you have a medical reason to take pain medicine. Your doctor will tell you how much medicine to take. If your doctor wants you to stop the medicine, the dose will be slowly lowered over time to avoid any side effects. You may get drowsy or dizzy. Do not drive, use machinery, or do anything that needs mental alertness until you know how this medicine affects you. Do not stand or sit up quickly, especially if you are an older patient. This reduces the risk of dizzy or fainting spells. Alcohol can increase or decrease the effects of this medicine. Avoid alcoholic drinks. You may have constipation. Try to have a bowel movement at least every 2 to 3 days. If you do not have a bowel movement for 3 days, call your doctor or health care professional. Your mouth may get dry. Chewing sugarless gum or sucking hard candy, and drinking plenty of water may help. Contact  your doctor if the problem does not go away or is severe. What side effects may I notice from receiving this medicine? Side effects that you should report to your doctor or health care professional as soon as possible: -allergic reactions like skin rash, itching or hives, swelling of the face, lips, or tongue -breathing difficulties, wheezing -confusion -itching -light headedness or fainting spells -redness, blistering, peeling or loosening of the skin, including inside the mouth -seizures Side effects  that usually do not require medical attention (report to your doctor or health care professional if they continue or are bothersome): -constipation -dizziness -drowsiness -headache -nausea, vomiting This list may not describe all possible side effects. Call your doctor for medical advice about side effects. You may report side effects to FDA at 1-800-FDA-1088. Where should I keep my medicine? Keep out of the reach of children. This medicine may cause accidental overdose and death if it taken by other adults, children, or pets. Mix any unused medicine with a substance like cat litter or coffee grounds. Then throw the medicine away in a sealed container like a sealed bag or a coffee can with a lid. Do not use the medicine after the expiration date. Store at room temperature between 15 and 30 degrees C (59 and 86 degrees F). NOTE: This sheet is a summary. It may not cover all possible information. If you have questions about this medicine, talk to your doctor, pharmacist, or health care provider.    2016, Elsevier/[employer] Standard. (2014-01-16 15:42:09)

## 2016-01-30 NOTE — ED Notes (Signed)
Sore throat and cough per pt.

## 2016-01-30 NOTE — ED Provider Notes (Signed)
CSN: [id number]     Arrival date & time 01/30/16  0404 History   First MD Initiated Contact with Patient 01/30/16 [phone]     Chief Complaint  Patient presents with  . Sore Throat  . Cough     (Consider location/radiation/quality/duration/timing/severity/associated sxs/prior Treatment) Patient is a 82 y.o. male presenting with pharyngitis and cough. The history is provided by the patient.  Sore Throat  Cough He complains of sore throat for last 4 days. There is associated rhinorrhea which is clear to gray, and cough which is productive of clear to gray sputum. He denies fever, chills, sweats. He denies nausea or vomiting. He denies arthralgias or myalgias. His main concern is his sore throat. He denies any sick contacts. He has tried a variety of over-the-counter medications without any benefit.  Past Medical History  Diagnosis Date  . Hypertension   . Gout   . Acute renal failure (HCC)     04/2015   Past Surgical History  Procedure Laterality Date  . Prostate surgery     No family history on file. Social History  Substance Use Topics  . Smoking status: Never Smoker   . Smokeless tobacco: None  . Alcohol Use: No    Review of Systems  Respiratory: Positive for cough.   All other systems reviewed and are negative.     Allergies  Review of patient's allergies indicates no known allergies.  Home Medications   Prior to Admission medications   Medication Sig Start Date End Date Taking? Authorizing Provider  acetaminophen (TYLENOL) 325 MG tablet Take 2 tablets (650 mg total) by mouth every 6 (six) hours as needed for mild pain (or Fever >/= 101). 04/17/15   Carylon Perches, MD  aspirin EC 81 MG tablet Take 81 mg by mouth daily.    Historical Provider, MD  atenolol (TENORMIN) 50 MG tablet Take 50 mg by mouth daily.    Historical Provider, MD  doxazosin (CARDURA) 8 MG tablet Take 8 mg by mouth daily.    Historical Provider, MD  enoxaparin (LOVENOX) 150 MG/ML injection Inject 0.5 mLs  (75 mg total) into the skin daily. 09/26/15   Jerelyn Scott, MD  HYDROcodone-acetaminophen (NORCO/VICODIN) 5-325 MG tablet Take 1-2 tablets by mouth every 4 (four) hours as needed for moderate pain. 09/26/15   Jerelyn Scott, MD  OVER THE COUNTER MEDICATION Apply 1 application topically 2 (two) times daily as needed (hand pain). Lidocaine plus cream (lidocaine hydrochloride 4%0    Historical Provider, MD  OVER THE COUNTER MEDICATION Apply 1 application topically 2 (two) times daily as needed (hand pain). Arthritis hot pain (menthyl salicylate 15% & menthol 10%)    Historical Provider, MD  vitamin C (ASCORBIC ACID) 500 MG tablet Take 500 mg by mouth daily.    Historical Provider, MD   BP 136/69 mmHg  Pulse 64  Temp(Src) 98.7 F (37.1 C) (Oral)  Resp 18  Ht  (1.727 m)  Wt 160 lb (72.576 kg)  BMI 24.33 kg/m2  SpO2 96% Physical Exam  Nursing note and vitals reviewed.  82 year old male, resting comfortably and in no acute distress. Vital signs are normal. Oxygen saturation is 96%, which is normal. Head is normocephalic and atraumatic. PERRLA, EOMI. Oropharynx is mildly erythematous without exudate. There is no pooling of secretions and phonation is normal. Neck is nontender and supple without adenopathy or JVD. Back is nontender and there is no CVA tenderness. Lungs are clear without rales, wheezes, or rhonchi. Chest is nontender.  Heart has regular rate and rhythm without murmur. Abdomen is soft, flat, nontender without masses or hepatosplenomegaly and peristalsis is normoactive. Extremities have no cyanosis or edema, full range of motion is present. Skin is warm and dry without rash. Neurologic: Mental status is normal, cranial nerves are intact, there are no motor or sensory deficits.  ED Course  Procedures (including critical care time) Labs Review Results for orders placed or performed during the hospital encounter of 01/30/16  Rapid strep screen (not at [facility])  Result Value Ref  Range   Streptococcus, Group A Screen (Direct) NEGATIVE NEGATIVE    I have personally reviewed and evaluated these lab results as part of my medical decision-making.   MDM   Final diagnoses:  Viral pharyngitis  Upper respiratory infection    Probable viral pharyngitis. Old records are reviewed and he has no relevant past visits. He is given a dose of dexamethasone and strep screen is obtained.   strep screen is negative. He is discharged with prescription for tramadol for pain and he is to follow-up with his PCP. He states he has no appointment scheduled for 5 days from now.  Dione Booze, MD 01/30/16 [phone]

## 2016-02-02 LAB — CULTURE, GROUP A STREP (THRC)

## 2016-02-03 DIAGNOSIS — M109 Gout, unspecified: Secondary | ICD-10-CM | POA: Diagnosis not present

## 2016-02-04 DIAGNOSIS — Z6828 Body mass index (BMI) 28.0-28.9, adult: Secondary | ICD-10-CM | POA: Diagnosis not present

## 2016-02-04 DIAGNOSIS — M1000 Idiopathic gout, unspecified site: Secondary | ICD-10-CM | POA: Diagnosis not present

## 2016-02-04 DIAGNOSIS — J069 Acute upper respiratory infection, unspecified: Secondary | ICD-10-CM | POA: Diagnosis not present

## 2016-02-13 ENCOUNTER — Emergency Department (HOSPITAL_COMMUNITY)
Admission: EM | Admit: 2016-02-13 | Discharge: 2016-02-13 | Disposition: A | Discharge status: Home / Self Care | Payer: Medicare Other | Attending: Emergency Medicine | Admitting: Emergency Medicine

## 2016-02-13 ENCOUNTER — Emergency Department (HOSPITAL_COMMUNITY): Payer: Medicare Other

## 2016-02-13 ENCOUNTER — Encounter (HOSPITAL_COMMUNITY): Payer: Self-pay | Admitting: Emergency Medicine

## 2016-02-13 DIAGNOSIS — J181 Lobar pneumonia, unspecified organism: Secondary | ICD-10-CM | POA: Insufficient documentation

## 2016-02-13 DIAGNOSIS — Z79899 Other long term (current) drug therapy: Secondary | ICD-10-CM | POA: Insufficient documentation

## 2016-02-13 DIAGNOSIS — J189 Pneumonia, unspecified organism: Secondary | ICD-10-CM

## 2016-02-13 DIAGNOSIS — J180 Bronchopneumonia, unspecified organism: Secondary | ICD-10-CM | POA: Diagnosis not present

## 2016-02-13 DIAGNOSIS — I10 Essential (primary) hypertension: Secondary | ICD-10-CM | POA: Insufficient documentation

## 2016-02-13 DIAGNOSIS — R05 Cough: Secondary | ICD-10-CM | POA: Diagnosis not present

## 2016-02-13 LAB — CBC WITH DIFFERENTIAL/PLATELET
BASOS ABS: 0 10*3/uL (ref 0.0–0.1)
Basophils Relative: 0 %
Eosinophils Absolute: 0.1 10*3/uL (ref 0.0–0.7)
Eosinophils Relative: 1 %
HEMATOCRIT: 37.3 % — AB (ref 39.0–52.0)
Hemoglobin: 12.2 g/dL — ABNORMAL LOW (ref 13.0–17.0)
LYMPHS PCT: 9 %
Lymphs Abs: 0.8 10*3/uL (ref 0.7–4.0)
MCH: 30.6 pg (ref 26.0–34.0)
MCHC: 32.7 g/dL (ref 30.0–36.0)
MCV: 93.5 fL (ref 78.0–100.0)
MONO ABS: 0.7 10*3/uL (ref 0.1–1.0)
Monocytes Relative: 9 %
NEUTROS ABS: 6.9 10*3/uL (ref 1.7–7.7)
Neutrophils Relative %: 81 %
Platelets: 298 10*3/uL (ref 150–400)
RBC: 3.99 MIL/uL — AB (ref 4.22–5.81)
RDW: 14.3 % (ref 11.5–15.5)
WBC: 8.5 10*3/uL (ref 4.0–10.5)

## 2016-02-13 LAB — BASIC METABOLIC PANEL
ANION GAP: 11 (ref 5–15)
BUN: 35 mg/dL — ABNORMAL HIGH (ref 6–20)
CHLORIDE: 103 mmol/L (ref 101–111)
CO2: 22 mmol/L (ref 22–32)
Calcium: 8.4 mg/dL — ABNORMAL LOW (ref 8.9–10.3)
Creatinine, Ser: 1.96 mg/dL — ABNORMAL HIGH (ref 0.61–1.24)
GFR calc Af Amer: 34 mL/min — ABNORMAL LOW (ref >60)
GFR calc non Af Amer: 29 mL/min — ABNORMAL LOW (ref >60)
GLUCOSE: 120 mg/dL — AB (ref 65–99)
POTASSIUM: 3.7 mmol/L (ref 3.5–5.1)
Sodium: 136 mmol/L (ref 135–145)

## 2016-02-13 MED ORDER — AMOXICILLIN 250 MG PO CAPS
500.0000 mg | ORAL_CAPSULE | Freq: Once | ORAL | Status: AC
  Administered 2016-02-13: 500 mg via ORAL
  Filled 2016-02-13: qty 2

## 2016-02-13 MED ORDER — ACETAMINOPHEN 500 MG PO TABS
1000.0000 mg | ORAL_TABLET | Freq: Once | ORAL | Status: AC
  Administered 2016-02-13: 1000 mg via ORAL
  Filled 2016-02-13: qty 2

## 2016-02-13 MED ORDER — AZITHROMYCIN 250 MG PO TABS: 250.0000 mg | ORAL_TABLET | Freq: Every day | ORAL | Status: DC

## 2016-02-13 MED ORDER — AZITHROMYCIN 250 MG PO TABS
500.0000 mg | ORAL_TABLET | Freq: Once | ORAL | Status: AC
  Administered 2016-02-13: 500 mg via ORAL
  Filled 2016-02-13: qty 2

## 2016-02-13 MED ORDER — AMOXICILLIN 500 MG PO CAPS: 500.0000 mg | ORAL_CAPSULE | Freq: Three times a day (TID) | ORAL | Status: AC

## 2016-02-13 MED ORDER — SODIUM CHLORIDE 0.9 % IV BOLUS (SEPSIS)
500.0000 mL | Freq: Once | INTRAVENOUS | Status: AC
  Administered 2016-02-13: 500 mL via INTRAVENOUS

## 2016-02-13 NOTE — ED Notes (Signed)
Pt ambulated around ER.  Maintained sats of 95% and denied shortness of breath.

## 2016-02-13 NOTE — ED Notes (Addendum)
Patient c/o productive cough with sinus pressure and decrease in appetite. Per patient thick white sputum.Per patient right lower rib pain with cough. Denies any fevers. Per patient has not eaten two day, feels fatigued.

## 2016-02-13 NOTE — ED Provider Notes (Signed)
CSN: [id number]     Arrival date & time 02/13/16  1436 History   First MD Initiated Contact with Patient 02/13/16 1504     Chief Complaint  Patient presents with  . Cough     (Consider location/radiation/quality/duration/timing/severity/associated sxs/prior Treatment) HPI 82 year old male who presents with cough. History of hypertension. States that he was seen in the ED 2 weeks ago for throat sore throat and cough and was diagnosed with a viral respiratory infection. States that the rhinorrhea and sore throat has improved but he continues to have cough productive of clear sputum. Denies any associating fevers, chills, chest pain or difficulty breathing. No nausea, vomiting, abdominal pain, diarrhea or urinary symptoms. Has had some decreased by mouth intake in the setting. No known sick contacts. States he feels tired and dehydrated. No lower extremity edema, orthopnea or PND. No dyspnea on exertion.   Past Medical History  Diagnosis Date  . Hypertension   . Gout   . Acute renal failure (HCC)     04/2015   Past Surgical History  Procedure Laterality Date  . Prostate surgery     History reviewed. No pertinent family history. Social History  Substance Use Topics  . Smoking status: Never Smoker   . Smokeless tobacco: Never Used  . Alcohol Use: No    Review of Systems 10/14 systems reviewed and are negative other than those stated in the HPI     Allergies  Review of patient's allergies indicates no known allergies.  Home Medications   Prior to Admission medications   Medication Sig Start Date End Date Taking? Authorizing Provider  allopurinol (ZYLOPRIM) 100 MG tablet Take 200 mg by mouth daily.   Yes Historical Provider, MD  amLODipine (NORVASC) 5 MG tablet Take 5 mg by mouth daily.   Yes Historical Provider, MD  atenolol (TENORMIN) 25 MG tablet Take 25 mg by mouth daily.   Yes Historical Provider, MD  doxazosin (CARDURA) 8 MG tablet Take 4 mg by mouth daily.    Yes  Historical Provider, MD  vitamin C (ASCORBIC ACID) 500 MG tablet Take 500 mg by mouth daily.   Yes Historical Provider, MD  acetaminophen (TYLENOL) 325 MG tablet Take 2 tablets (650 mg total) by mouth every 6 (six) hours as needed for mild pain (or Fever >/= 101). 04/17/15   Carylon Perchesoy Fagan, MD  amoxicillin (AMOXIL) 500 MG capsule Take 1 capsule (500 mg total) by mouth 3 (three) times daily. 02/13/16 02/26/16  Lavera Guiseana Duo Riyad Keena, MD  azithromycin (ZITHROMAX) 250 MG tablet Take 1 tablet (250 mg total) by mouth daily. Take 1 tablet daily until finished starting 02/14/2016 02/14/16   Lavera Guiseana Duo Reichen Hutzler, MD  enoxaparin (LOVENOX) 150 MG/ML injection Inject 0.5 mLs (75 mg total) into the skin daily. 09/26/15   Jerelyn ScottMartha Linker, MD  HYDROcodone-acetaminophen (NORCO/VICODIN) 5-325 MG tablet Take 1-2 tablets by mouth every 4 (four) hours as needed for moderate pain. 09/26/15   Jerelyn ScottMartha Linker, MD  traMADol (ULTRAM) 50 MG tablet Take 1 tablet (50 mg total) by mouth every 6 (six) hours as needed. 01/30/16   Dione Boozeavid Glick, MD   BP 118/75 mmHg  Pulse 80  Temp(Src) 97.8 F (36.6 C) (Oral)  Resp 18  SpO2 99% Physical Exam Physical Exam  Nursing note and vitals reviewed. Constitutional: Elderly appearing man, well developed, well nourished, non-toxic, and in no acute distress Head: Normocephalic and atraumatic.  Mouth/Throat: Oropharynx is clear and moist.  Neck: Normal range of motion. Neck supple.  Cardiovascular: Normal rate  and regular rhythm.  No edema.  Pulmonary/Chest: Effort normal and breath sounds normal.  Abdominal: Soft. There is no tenderness. There is no rebound and no guarding.  Musculoskeletal: Normal range of motion.  Neurological: Alert, no facial droop, fluent speech, moves all extremities symmetrically Skin: Skin is warm and dry.  Psychiatric: Cooperative  ED Course  Procedures (including critical care time) Labs Review Labs Reviewed  CBC WITH DIFFERENTIAL/PLATELET - Abnormal; Notable for the following:     RBC 3.99 (*)    Hemoglobin 12.2 (*)    HCT 37.3 (*)    All other components within normal limits  BASIC METABOLIC PANEL - Abnormal; Notable for the following:    Glucose, Bld 120 (*)    BUN 35 (*)    Creatinine, Ser 1.96 (*)    Calcium 8.4 (*)    GFR calc non Af Amer 29 (*)    GFR calc Af Amer 34 (*)    All other components within normal limits    Imaging Review Dg Chest 2 View  02/13/2016  CLINICAL DATA:  Cough. EXAM: CHEST  2 VIEW COMPARISON:  04/15/2015 chest radiograph. FINDINGS: Stable cardiomediastinal silhouette with top-normal heart size. No pneumothorax. No pleural effusion. There is new patchy consolidation in the left lower lobe. No pulmonary edema. IMPRESSION: New patchy left lower lobe consolidation, most in keeping with a pneumonia in the correct clinical setting. Recommend follow-up PA and lateral post treatment chest radiographs in 4-6 weeks. Electronically Signed   By: Delbert Phenix M.D.   On: 02/13/2016 16:19   I have personally reviewed and evaluated these images and lab results as part of my medical decision-making.   EKG Interpretation None      MDM   Final diagnoses:  Lobar pneumonia (HCC)  CAP (community acquired pneumonia)   82 year old male who presents with persistent cough in the setting of recent diagnosis of  viral respiratory infection. he is well-appearing and in no acute distress. On room air, breathing comfortably, without conversational dyspnea. Lungs are clear to auscultation. Appears well-hydrated. Remainder of exam unremarkable. We'll obtain chest x-ray.   4:45PM CXr with lobar LLL pneumonia. No risk factors for healthcare associated pneumonia. I will treat with course of azithromycin and amoxicillin. No systemic signs or symptoms of illness, and ambulates without dyspnea or hypoxia here in the emergency department. I think he is appropriate for outpatient treatment. Strict return and follow-up instructions are reviewed. He expressed understanding  of all discharge instructions, and felt comfortable with the plan of care.  Lavera Guise, MD 02/14/16 0040

## 2016-02-13 NOTE — Discharge Instructions (Signed)
Please see her primary care doctor in 2-3 days for recheck. Take antibiotics as prescribed. Return without fail for worsening symptoms including fevers, difficulty breathing, chest pain, confusion or any other symptoms concerning to you.  Community-Acquired Pneumonia, Adult Pneumonia is an infection of the lungs. One type of pneumonia can happen while a person is in a hospital. A different type can happen when a person is not in a hospital (community-acquired pneumonia). It is easy for this kind to spread from person to person. It can spread to you if you breathe near an infected person who coughs or sneezes. Some symptoms include:  A dry cough.  A wet (productive) cough.  Fever.  Sweating.  Chest pain. HOME CARE  Take over-the-counter and prescription medicines only as told by your doctor.  Only take cough medicine if you are losing sleep.  If you were prescribed an antibiotic medicine, take it as told by your doctor. Do not stop taking the antibiotic even if you start to feel better.  Sleep with your head and neck raised (elevated). You can do this by putting a few pillows under your head, or you can sleep in a recliner.  Do not use tobacco products. These include cigarettes, chewing tobacco, and e-cigarettes. If you need help quitting, ask your doctor.  Drink enough water to keep your pee (urine) clear or pale yellow. A shot (vaccine) can help prevent pneumonia. Shots are often suggested for:  People older than 80 years of age.  People older than 80 years of age:  Who are having cancer treatment.  Who have long-term (chronic) lung disease.  Who have problems with their body's defense system (immune system). You may also prevent pneumonia if you take these actions:  Get the flu (influenza) shot every year.  Go to the dentist as often as told.  Wash your hands often. If soap and water are not available, use hand sanitizer. GET HELP IF:  You have a fever.  You lose  sleep because your cough medicine does not help. GET HELP RIGHT AWAY IF:  You are short of breath and it gets worse.  You have more chest pain.  Your sickness gets worse. This is very serious if:  You are an older adult.  Your body's defense system is weak.  You cough up blood.   This information is not intended to replace advice given to you by your health care provider. Make sure you discuss any questions you have with your health care provider.   Document Released: 05/08/2008 Document Revised: 08/11/2015 Document Reviewed: 03/17/2015 Elsevier Interactive Patient Education [employer]2016 [employer] Inc[employer]

## 2016-02-26 ENCOUNTER — Emergency Department (HOSPITAL_COMMUNITY): Payer: Medicare Other

## 2016-02-26 ENCOUNTER — Encounter (HOSPITAL_COMMUNITY): Payer: Self-pay | Admitting: Emergency Medicine

## 2016-02-26 ENCOUNTER — Emergency Department (HOSPITAL_COMMUNITY)
Admission: EM | Admit: 2016-02-26 | Discharge: 2016-02-26 | Disposition: A | Discharge status: Home / Self Care | Payer: Medicare Other | Attending: Emergency Medicine | Admitting: Emergency Medicine

## 2016-02-26 DIAGNOSIS — Z792 Long term (current) use of antibiotics: Secondary | ICD-10-CM | POA: Diagnosis not present

## 2016-02-26 DIAGNOSIS — Y9389 Activity, other specified: Secondary | ICD-10-CM | POA: Insufficient documentation

## 2016-02-26 DIAGNOSIS — Y999 Unspecified external cause status: Secondary | ICD-10-CM | POA: Insufficient documentation

## 2016-02-26 DIAGNOSIS — M109 Gout, unspecified: Secondary | ICD-10-CM | POA: Diagnosis not present

## 2016-02-26 DIAGNOSIS — W1831XA Fall on same level due to stepping on an object, initial encounter: Secondary | ICD-10-CM | POA: Insufficient documentation

## 2016-02-26 DIAGNOSIS — M25562 Pain in left knee: Secondary | ICD-10-CM | POA: Diagnosis not present

## 2016-02-26 DIAGNOSIS — M25531 Pain in right wrist: Secondary | ICD-10-CM | POA: Diagnosis not present

## 2016-02-26 DIAGNOSIS — Y929 Unspecified place or not applicable: Secondary | ICD-10-CM | POA: Insufficient documentation

## 2016-02-26 DIAGNOSIS — Z79899 Other long term (current) drug therapy: Secondary | ICD-10-CM | POA: Diagnosis not present

## 2016-02-26 DIAGNOSIS — S8991XA Unspecified injury of right lower leg, initial encounter: Secondary | ICD-10-CM | POA: Diagnosis not present

## 2016-02-26 DIAGNOSIS — S63501A Unspecified sprain of right wrist, initial encounter: Secondary | ICD-10-CM

## 2016-02-26 DIAGNOSIS — S6991XA Unspecified injury of right wrist, hand and finger(s), initial encounter: Secondary | ICD-10-CM | POA: Diagnosis not present

## 2016-02-26 DIAGNOSIS — I10 Essential (primary) hypertension: Secondary | ICD-10-CM | POA: Insufficient documentation

## 2016-02-26 NOTE — Discharge Instructions (Signed)
Wrist Sprain With Rehab °A sprain is an injury in which a ligament that maintains the proper alignment of a joint is partially or completely torn. The ligaments of the wrist are susceptible to sprains. Sprains are classified into three categories. Grade 1 sprains cause pain, but the tendon is not lengthened. Grade 2 sprains include a lengthened ligament because the ligament is stretched or partially ruptured. With grade 2 sprains there is still function, although the function may be diminished. Grade 3 sprains are characterized by a complete tear of the tendon or muscle, and function is usually impaired. °SYMPTOMS  °· Pain tenderness, inflammation, and/or bruising (contusion) of the injury. °· A "pop" or tear felt and/or heard at the time of injury. °· Decreased wrist function. °CAUSES  °A wrist sprain occurs when a force is placed on one or more ligaments that is greater than it/they can withstand. Common mechanisms of injury include: °· Catching a ball with your hands. °· Repetitive and/ or strenuous extension or flexion of the wrist. °RISK INCREASES WITH: °· Previous wrist injury. °· Contact sports (boxing or wrestling). °· Activities in which falling is common. °· Poor strength and flexibility. °· Improperly fitted or padded protective equipment. °PREVENTION °· Warm up and stretch properly before activity. °· Allow for adequate recovery between workouts. °· Maintain physical fitness: °¨ Strength, flexibility, and endurance. °¨ Cardiovascular fitness. °· Protect the wrist joint by limiting its motion with the use of taping, braces, or splints. °· Protect the wrist after injury for 6 to 12 months. °PROGNOSIS  °The prognosis for wrist sprains depends on the degree of injury. Grade 1 sprains require 2 to 6 weeks of treatment. Grade 2 sprains require 6 to 8 weeks of treatment, and grade 3 sprains require up to 12 weeks.  °RELATED COMPLICATIONS  °· Prolonged healing time, if improperly treated or  re-injured. °· Recurrent symptoms that result in a chronic problem. °· Injury to nearby structures (bone, cartilage, nerves, or tendons). °· Arthritis of the wrist. °· Inability to compete in athletics at a high level. °· Wrist stiffness or weakness. °· Progression to a complete rupture of the ligament. °TREATMENT  °Treatment initially involves resting from any activities that aggravate the symptoms, and the use of ice and medications to help reduce pain and inflammation. Your caregiver may recommend immobilizing the wrist for a period of time in order to reduce stress on the ligament and allow for healing. After immobilization it is important to perform strengthening and stretching exercises to help regain strength and a full range of motion. These exercises may be completed at home or with a therapist. Surgery is not usually required for wrist sprains, unless the ligament has been ruptured (grade 3 sprain). °MEDICATION  °· If pain medication is necessary, then nonsteroidal anti-inflammatory medications, such as aspirin and ibuprofen, or other minor pain relievers, such as acetaminophen, are often recommended. °· Do not take pain medication for 7 days before surgery. °· Prescription pain relievers may be given if deemed necessary by your caregiver. Use only as directed and only as much as you need. °HEAT AND COLD °· Cold treatment (icing) relieves pain and reduces inflammation. Cold treatment should be applied for 10 to 15 minutes every 2 to 3 hours for inflammation and pain and immediately after any activity that aggravates your symptoms. Use ice packs or massage the area with a piece of ice (ice massage). °· Heat treatment may be used prior to performing the stretching and strengthening activities prescribed by your   caregiver, physical therapist, or athletic trainer. Use a heat pack or soak your injury in warm water. °SEEK MEDICAL CARE IF: °· Treatment seems to offer no benefit, or the condition worsens. °· Any  medications produce adverse side effects. ° °

## 2016-02-26 NOTE — ED Notes (Addendum)
Patient c/o right hand/wrist pain and right knee pain after falling 2 days ago. Per patioent getting into truck and was holding onto steering wheel when he fell. Denies hitting head or LOC. Per patient landed on buttock.

## 2016-02-28 NOTE — ED Provider Notes (Signed)
CSN: [id number]     Arrival date & time 02/26/16  1135 History   First MD Initiated Contact with Patient 02/26/16 1423     Chief Complaint  Patient presents with  . Fall     (Consider location/radiation/quality/duration/timing/severity/associated sxs/prior Treatment) The history is provided by the patient and a relative.   Robert Cardenas is a 82 y.o. male presenting for evaluation of right hand and wrist pain and swelling which has been persistent since he slipped getting into his truck 2 days ago.  He describes stepping onto the running board and was hanging onto the steering will with his right hand when his foot slipped and he fell on his right knee (onto the running board) stretching his hand and wrist.  However,  He does endorse having mild swelling and pain in the hand even before this incident, which he states is somewhat similar to gout pain.  He is taking allopurinol and has added his colcrys for the past several days, but continues to have pain, especially in the right wrist with attempts to hyperflex (as when trying to push out of a chair).  He denies radiation of pain.  His knee pain is nearly resolved and has had no difficulty with ambulation. He did not hit his head during the fall.    Past Medical History  Diagnosis Date  . Hypertension   . Gout   . Acute renal failure (HCC)     04/2015   Past Surgical History  Procedure Laterality Date  . Prostate surgery     No family history on file. Social History  Substance Use Topics  . Smoking status: Never Smoker   . Smokeless tobacco: Never Used  . Alcohol Use: No    Review of Systems  Constitutional: Negative for fever.  Musculoskeletal: Positive for joint swelling and arthralgias. Negative for myalgias.  Neurological: Negative for weakness and numbness.      Allergies  Review of patient's allergies indicates no known allergies.  Home Medications   Prior to Admission medications   Medication Sig Start Date End  Date Taking? Authorizing Provider  acetaminophen (TYLENOL) 325 MG tablet Take 2 tablets (650 mg total) by mouth every 6 (six) hours as needed for mild pain (or Fever >/= 101). 04/17/15   Carylon Perches, MD  allopurinol (ZYLOPRIM) 100 MG tablet Take 200 mg by mouth daily.    Historical Provider, MD  amLODipine (NORVASC) 5 MG tablet Take 5 mg by mouth daily.    Historical Provider, MD  atenolol (TENORMIN) 25 MG tablet Take 25 mg by mouth daily.    Historical Provider, MD  azithromycin (ZITHROMAX) 250 MG tablet Take 1 tablet (250 mg total) by mouth daily. Take 1 tablet daily until finished starting 02/14/2016 02/14/16   Lavera Guise, MD  doxazosin (CARDURA) 8 MG tablet Take 4 mg by mouth daily.     Historical Provider, MD  enoxaparin (LOVENOX) 150 MG/ML injection Inject 0.5 mLs (75 mg total) into the skin daily. 09/26/15   Jerelyn Scott, MD  HYDROcodone-acetaminophen (NORCO/VICODIN) 5-325 MG tablet Take 1-2 tablets by mouth every 4 (four) hours as needed for moderate pain. 09/26/15   Jerelyn Scott, MD  traMADol (ULTRAM) 50 MG tablet Take 1 tablet (50 mg total) by mouth every 6 (six) hours as needed. 01/30/16   Dione Booze, MD  vitamin C (ASCORBIC ACID) 500 MG tablet Take 500 mg by mouth daily.    Historical Provider, MD   BP 112/72 mmHg  Pulse 62  Temp(Src) 98.6 F (37 C) (Oral)  Resp 16  Ht 5\' 10"  (1.778 m)  Wt 77.111 kg  BMI 24.39 kg/m2  SpO2 97% Physical Exam  Constitutional: He appears well-developed and well-nourished.  HENT:  Head: Atraumatic.  Neck: Normal range of motion.  Cardiovascular:  Pulses equal bilaterally  Musculoskeletal: He exhibits tenderness.       Right knee: He exhibits no swelling, no effusion, no deformity, no laceration, no erythema, no LCL laxity and no MCL laxity. No tenderness found.       Hands: TTP right dorsal proximal hand and wrist.  Right thumb with generalized edema.  Skin intact.  Distal cap refill less than 2 sec.  Radial pulse intact.  No scaphoid tenderness.  Forearm nontender.  Neurological: He is alert. He has normal strength. He displays normal reflexes. No sensory deficit.  Skin: Skin is warm and dry.  Psychiatric: He has a normal mood and affect.    ED Course  Procedures (including critical care time) Labs Review Labs Reviewed - No data to display  Imaging Review No results found. I have personally reviewed and evaluated these images and lab results as part of my medical decision-making.   EKG Interpretation None      MDM   Final diagnoses:  Gout of right hand, unspecified cause, unspecified chronicity  Wrist sprain, right, initial encounter    Pt with probable right wrist sprain in addition to gouty flare. velcro splint provided.  Elevation, heat tx recommended given gouthflare. Wrist injury several days old, doubt ice will offer benefit at this time, but will make gout worse.  Advised f/u with pcp or Dr. Romeo AppleHarrison (has seen prior) if sx persist or worsen.    Pt was seen by Dr. Criss AlvineGoldston prior to dc home.    Burgess AmorJulie Doratha Mcswain, PA-C 02/28/16 1703  Pricilla LovelessScott Goldston, MD 03/02/16 [phone]

## 2016-03-02 ENCOUNTER — Ambulatory Visit (INDEPENDENT_AMBULATORY_CARE_PROVIDER_SITE_OTHER): Payer: Medicare Other | Admitting: Orthopedic Surgery

## 2016-03-02 VITALS — BP 135/66 | HR 55 | Ht 70.0 in | Wt 165.8 lb

## 2016-03-02 DIAGNOSIS — M25641 Stiffness of right hand, not elsewhere classified: Secondary | ICD-10-CM | POA: Diagnosis not present

## 2016-03-02 NOTE — Patient Instructions (Signed)
Place hand in warm water move fingers

## 2016-03-02 NOTE — Progress Notes (Signed)
Chief Complaint  Patient presents with  . Wrist Pain   Wrist Pain  The pain is present in the right hand and right wrist. This is a new problem. The current episode started in the past 7 days. There has been a history of trauma. The problem has been gradually worsening. The quality of the pain is described as aching. The pain is at a severity of 3/10. Associated symptoms include a limited range of motion and stiffness. Pertinent negatives include no fever, joint locking, numbness or tingling. The symptoms are aggravated by cold. He has tried acetaminophen for the symptoms.     Review of Systems  Constitutional: Negative for fever.  HENT: Positive for hearing loss.   Musculoskeletal: Positive for stiffness.  Neurological: Positive for dizziness. Negative for tingling and numbness.    Past Medical History  Diagnosis Date  . Hypertension   . Gout   . Acute renal failure (HCC)     04/2015    Past Surgical History  Procedure Laterality Date  . Prostate surgery     No family history on file. Social History  Substance Use Topics  . Smoking status: Never Smoker   . Smokeless tobacco: Never Used  . Alcohol Use: No    Current outpatient prescriptions:  .  acetaminophen (TYLENOL) 325 MG tablet, Take 2 tablets (650 mg total) by mouth every 6 (six) hours as needed for mild pain (or Fever >/= 101)., Disp: 100 tablet, Rfl: 1 .  allopurinol (ZYLOPRIM) 100 MG tablet, Take 200 mg by mouth daily., Disp: , Rfl:  .  amLODipine (NORVASC) 5 MG tablet, Take 5 mg by mouth daily., Disp: , Rfl:  .  atenolol (TENORMIN) 25 MG tablet, Take 25 mg by mouth daily., Disp: , Rfl:  .  azithromycin (ZITHROMAX) 250 MG tablet, Take 1 tablet (250 mg total) by mouth daily. Take 1 tablet daily until finished starting 02/14/2016, Disp: 4 tablet, Rfl: 0 .  doxazosin (CARDURA) 8 MG tablet, Take 4 mg by mouth daily. , Disp: , Rfl:  .  enoxaparin (LOVENOX) 150 MG/ML injection, Inject 0.5 mLs (75 mg total) into the skin  daily., Disp: 10 Syringe, Rfl: 0 .  HYDROcodone-acetaminophen (NORCO/VICODIN) 5-325 MG tablet, Take 1-2 tablets by mouth every 4 (four) hours as needed for moderate pain., Disp: 15 tablet, Rfl: 0 .  traMADol (ULTRAM) 50 MG tablet, Take 1 tablet (50 mg total) by mouth every 6 (six) hours as needed., Disp: 15 tablet, Rfl: 0 .  vitamin C (ASCORBIC ACID) 500 MG tablet, Take 500 mg by mouth daily., Disp: , Rfl:   BP 135/66 mmHg  Pulse 55  Ht  (1.778 m)  Wt 165 lb 12.8 oz (75.206 kg)  BMI 23.79 kg/m2  Physical Exam  Constitutional: He is oriented to person, place, and time. He appears well-developed and well-nourished. No distress.  Cardiovascular: Normal rate and intact distal pulses.   Musculoskeletal:  He walks with a cane has fairly unsteady gait.  He has swelling of his right thumb very minimal tenderness but decreased range of motion in the digits of his right hand no unstable joints weakness secondary to loss of motion in terms of grip skin is blotchy but no pathologic lesions pulses good sensation normal lymph nodes negative in the right upper extremity  Left hand shows less swelling around the thumb and full range of motion  Neurological: He is alert and oriented to person, place, and time.  Skin: Skin is warm and dry. No rash  noted. He is not diaphoretic. No erythema. No pallor.  Psychiatric: He has a normal mood and affect. His behavior is normal. Judgment and thought content normal.    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

## 2016-03-16 DIAGNOSIS — D234 Other benign neoplasm of skin of scalp and neck: Secondary | ICD-10-CM | POA: Diagnosis not present

## 2016-03-16 DIAGNOSIS — D3611 Benign neoplasm of peripheral nerves and autonomic nervous system of face, head, and neck: Secondary | ICD-10-CM | POA: Diagnosis not present

## 2016-03-16 DIAGNOSIS — D225 Melanocytic nevi of trunk: Secondary | ICD-10-CM | POA: Diagnosis not present

## 2016-03-21 ENCOUNTER — Ambulatory Visit (INDEPENDENT_AMBULATORY_CARE_PROVIDER_SITE_OTHER): Payer: Medicare Other | Admitting: Orthopedic Surgery

## 2016-03-21 VITALS — BP 146/63 | HR 57 | Ht 70.0 in | Wt 165.0 lb

## 2016-03-21 DIAGNOSIS — M25641 Stiffness of right hand, not elsewhere classified: Secondary | ICD-10-CM | POA: Diagnosis not present

## 2016-03-21 NOTE — Progress Notes (Signed)
Follow-up visit  Chief Complaint  Patient presents with  . Follow-up    Right hand sprain, DOI 02/26/16    Sprain right hand  Patient reports mild soreness in the wrist and hand but improved range of motion  His functional range of motion is returned to normal  He is discharged and released

## 2016-05-02 ENCOUNTER — Encounter: Payer: Self-pay | Admitting: Orthopaedic Surgery

## 2016-05-02 ENCOUNTER — Ambulatory Visit (INDEPENDENT_AMBULATORY_CARE_PROVIDER_SITE_OTHER): Payer: Medicare Other

## 2016-05-02 ENCOUNTER — Ambulatory Visit (INDEPENDENT_AMBULATORY_CARE_PROVIDER_SITE_OTHER): Payer: Medicare Other | Admitting: Orthopaedic Surgery

## 2016-05-02 VITALS — BP 120/55 | HR 64 | Temp 98.6°F | Ht 70.0 in | Wt 167.0 lb

## 2016-05-02 DIAGNOSIS — M79641 Pain in right hand: Secondary | ICD-10-CM | POA: Diagnosis not present

## 2016-05-02 DIAGNOSIS — M10041 Idiopathic gout, right hand: Secondary | ICD-10-CM | POA: Diagnosis not present

## 2016-05-02 DIAGNOSIS — I10 Essential (primary) hypertension: Secondary | ICD-10-CM

## 2016-05-02 DIAGNOSIS — M79609 Pain in unspecified limb: Secondary | ICD-10-CM | POA: Diagnosis not present

## 2016-05-02 DIAGNOSIS — M109 Gout, unspecified: Secondary | ICD-10-CM

## 2016-05-02 NOTE — Progress Notes (Signed)
   Subjective: my hand is red and hurts    Patient ID: Robert Cardenas, male    DOB: 02/15/1926, 82 y.o.   MRN: [id number]  Hand Pain  The incident occurred 3 to 5 days ago. There was no injury mechanism. The pain is present in the right hand. The quality of the pain is described as aching. The pain does not radiate. The pain is at a severity of 4/10. The pain is moderate. The pain has been worsening since the incident. Pertinent negatives include no chest pain. He has tried acetaminophen and ice for the symptoms. The treatment provided mild relief.   He has had increasing redness and pain of the right hand over the last several days.  He has no trauma.  He has redness over the third metacarpal head areas.  He has no insect bite.  He has history of gout.  He is not taking anything for it.  He has hypertension well controlled.  He has been told his blood sugars were slightly elevated and watches his diet closely.  He has seen Dr. Ouida SillsFagan in the past for right hand pain and told he had gout and was treated for it and bot better.    Review of Systems  HENT: Negative for congestion.   Respiratory: Positive for shortness of breath. Negative for cough.   Cardiovascular: Negative for chest pain and leg swelling.  Endocrine: Positive for cold intolerance.  Musculoskeletal: Positive for myalgias and arthralgias.  Allergic/Immunologic: Positive for environmental allergies.       Objective:   Physical Exam  Constitutional: He is oriented to person, place, and time. He appears well-developed and well-nourished.  HENT:  Head: Normocephalic and atraumatic.  Eyes: Conjunctivae and EOM are normal. Pupils are equal, round, and reactive to light.  Neck: Normal range of motion. Neck supple.  Cardiovascular: Normal rate, regular rhythm and intact distal pulses.   Pulmonary/Chest: Effort normal.  Abdominal: Soft.  Musculoskeletal: He exhibits tenderness (His right hand dominant is tender and has some  redness over the third metacarpal dorsally wtih normal motion.  NV is intact.  Other hand normal.).  Neurological: He is alert and oriented to person, place, and time. He has normal reflexes. No cranial nerve deficit. He exhibits normal muscle tone. Coordination normal.  Skin: Skin is warm and dry.  Psychiatric: He has a normal mood and affect. His behavior is normal. Judgment and thought content normal.          Assessment & Plan:   Encounter Diagnoses  Name Primary?  . Right hand pain Yes  . Acute gout of right hand, unspecified cause   . Essential hypertension     I am concerned he has acute gout.  I have spent some time today talking to him about what gout is and how he inherited it.  I have told him he needs to be on some medicine for this all the time.  I spent a good 25 to 30 minutes going over this with him in detail of the visit.  I want him to go to the lab and get serum uric acid drawn.  Return in one week.  Samples of Uloric given.  Call if any problem.

## 2016-05-02 NOTE — Patient Instructions (Addendum)
Return with lab results (Uric Acid test). Continue medications.  Samples of Uloric (7 pills)  were given.

## 2016-05-03 DIAGNOSIS — N189 Chronic kidney disease, unspecified: Secondary | ICD-10-CM | POA: Diagnosis not present

## 2016-05-03 DIAGNOSIS — Z79899 Other long term (current) drug therapy: Secondary | ICD-10-CM | POA: Diagnosis not present

## 2016-05-03 DIAGNOSIS — M1000 Idiopathic gout, unspecified site: Secondary | ICD-10-CM | POA: Diagnosis not present

## 2016-05-03 DIAGNOSIS — E875 Hyperkalemia: Secondary | ICD-10-CM | POA: Diagnosis not present

## 2016-05-03 DIAGNOSIS — M109 Gout, unspecified: Secondary | ICD-10-CM | POA: Diagnosis not present

## 2016-05-03 LAB — URIC ACID: Uric Acid, Serum: 6.1 mg/dL (ref 4.0–7.8)

## 2016-05-09 ENCOUNTER — Encounter: Payer: Self-pay | Admitting: Orthopaedic Surgery

## 2016-05-09 ENCOUNTER — Ambulatory Visit (INDEPENDENT_AMBULATORY_CARE_PROVIDER_SITE_OTHER): Payer: Medicare Other | Admitting: Orthopaedic Surgery

## 2016-05-09 VITALS — BP 135/68 | HR 67 | Temp 97.3°F | Ht 66.0 in | Wt 171.6 lb

## 2016-05-09 DIAGNOSIS — M10041 Idiopathic gout, right hand: Secondary | ICD-10-CM

## 2016-05-09 NOTE — Patient Instructions (Signed)
Continue medications

## 2016-05-09 NOTE — Progress Notes (Signed)
Patient Robert Cardenas, male DOB:October 02, 1926, 82 y.o. WUJ:[id number]  Chief Complaint  Patient presents with  . Follow-up    Right hand    HPI  Robert Cardenas is a 82 y.o. male who had right hand pain I felt was related to gout.  He had uric acid drawn which was 6.1.  He later saw Dr. Ouida Sills who gave him prednisone and his pain resolved.  He also discovered the patient was not taking his allopurinol.  The patient is now taking the allopurinol. He is much improved with no pain now.  HPI  Body mass index is 29.7 kg/(m^2).  ROS  Review of Systems  HENT: Negative for congestion.   Respiratory: Positive for shortness of breath. Negative for cough.   Cardiovascular: Negative for chest pain and leg swelling.  Endocrine: Positive for cold intolerance.  Musculoskeletal: Positive for myalgias and arthralgias.  Allergic/Immunologic: Positive for environmental allergies.    Past Medical History  Diagnosis Date  . Hypertension   . Gout   . Acute renal failure (HCC)     04/2015    Past Surgical History  Procedure Laterality Date  . Prostate surgery      History reviewed. No pertinent family history.  Social History Social History  Substance Use Topics  . Smoking status: Never Smoker   . Smokeless tobacco: Never Used  . Alcohol Use: No    No Known Allergies  Current Outpatient Prescriptions  Medication Sig Dispense Refill  . acetaminophen (TYLENOL) 325 MG tablet Take 2 tablets (650 mg total) by mouth every 6 (six) hours as needed for mild pain (or Fever >/= 101). 100 tablet 1  . allopurinol (ZYLOPRIM) 100 MG tablet Take 200 mg by mouth daily.    Marland Kitchen amLODipine (NORVASC) 5 MG tablet Take 5 mg by mouth daily.    Marland Kitchen atenolol (TENORMIN) 25 MG tablet Take 25 mg by mouth daily.    Marland Kitchen azithromycin (ZITHROMAX) 250 MG tablet Take 1 tablet (250 mg total) by mouth daily. Take 1 tablet daily until finished starting 02/14/2016 4 tablet 0  . doxazosin (CARDURA) 8 MG tablet Take 4 mg by mouth  daily.     Marland Kitchen enoxaparin (LOVENOX) 150 MG/ML injection Inject 0.5 mLs (75 mg total) into the skin daily. 10 Syringe 0  . HYDROcodone-acetaminophen (NORCO/VICODIN) 5-325 MG tablet Take 1-2 tablets by mouth every 4 (four) hours as needed for moderate pain. 15 tablet 0  . traMADol (ULTRAM) 50 MG tablet Take 1 tablet (50 mg total) by mouth every 6 (six) hours as needed. 15 tablet 0  . vitamin C (ASCORBIC ACID) 500 MG tablet Take 500 mg by mouth daily.     No current facility-administered medications for this visit.     Physical Exam  Blood pressure 135/68, pulse 67, temperature 97.3 F (36.3 C), height 5' 3.75" (1.619 m), weight 171 lb 9.6 oz (77.837 kg).  Constitutional: overall normal hygiene, normal nutrition, well developed, normal grooming, normal body habitus. Assistive device:none  Musculoskeletal: gait and station Limp none, muscle tone and strength are normal, no tremors or atrophy is present.  .  Neurological: coordination overall normal.  Deep tendon reflex/nerve stretch intact.  Sensation normal.  Cranial nerves II-XII intact.   Skin:   normal overall no scars, lesions, ulcers or rashes. No psoriasis.  Psychiatric: Alert and oriented x 3.  Recent memory intact, remote memory unclear.  Normal mood and affect. Well groomed.  Good eye contact.  Cardiovascular: overall no swelling, no varicosities, no  edema bilaterally, normal temperatures of the legs and arms, no clubbing, cyanosis and good capillary refill.  Lymphatic: palpation is normal.  Both hands are normal with no redness and full ROM.  NV is intact.  The patient has been educated about the nature of the problem(s) and counseled on treatment options.  The patient appeared to understand what I have discussed and is in agreement with it.  Encounter Diagnosis  Name Primary?  . Acute idiopathic gout of right hand Yes    PLAN Call if any problems.  Precautions discussed.  Continue current medications.   Return to  clinic PRN

## 2016-05-11 DIAGNOSIS — I10 Essential (primary) hypertension: Secondary | ICD-10-CM | POA: Diagnosis not present

## 2016-05-11 DIAGNOSIS — N183 Chronic kidney disease, stage 3 (moderate): Secondary | ICD-10-CM | POA: Diagnosis not present

## 2016-05-11 DIAGNOSIS — M1000 Idiopathic gout, unspecified site: Secondary | ICD-10-CM | POA: Diagnosis not present

## 2016-05-29 IMAGING — DX DG ELBOW COMPLETE 3+V*R*
4 series · 4 of 4 positions shown · non-contrast
Comparison: None

CLINICAL DATA: Elbow pain and swelling, aggravated symptoms after
trimming trees in yard, elbow pain worse with flexion

EXAM:
RIGHT ELBOW - COMPLETE 3+ VIEW

[elbow ap]
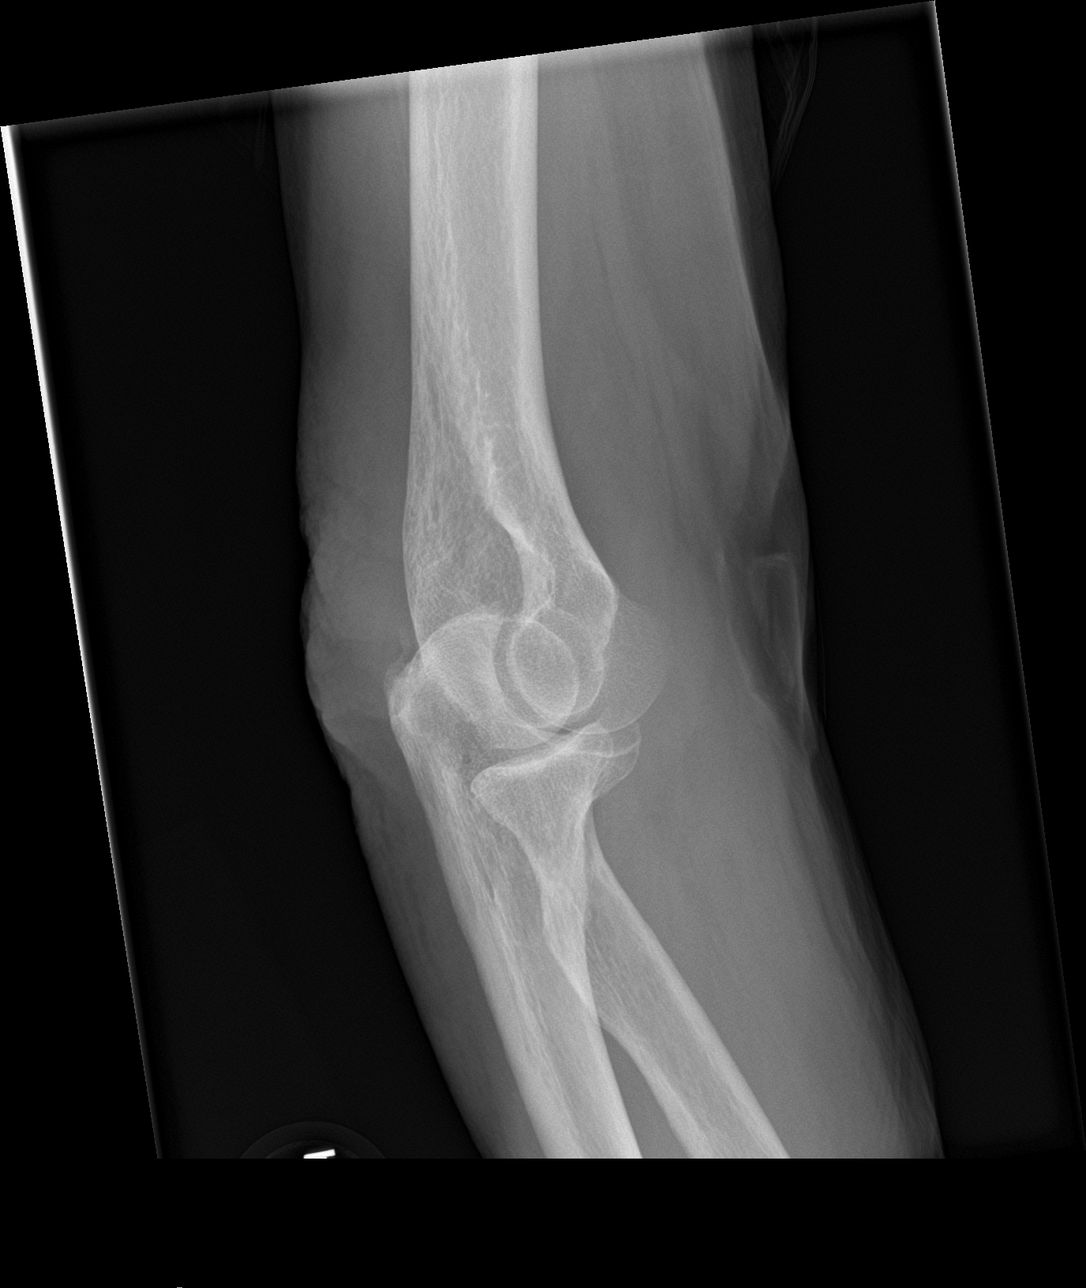

[elbow obl (1 of 2)]
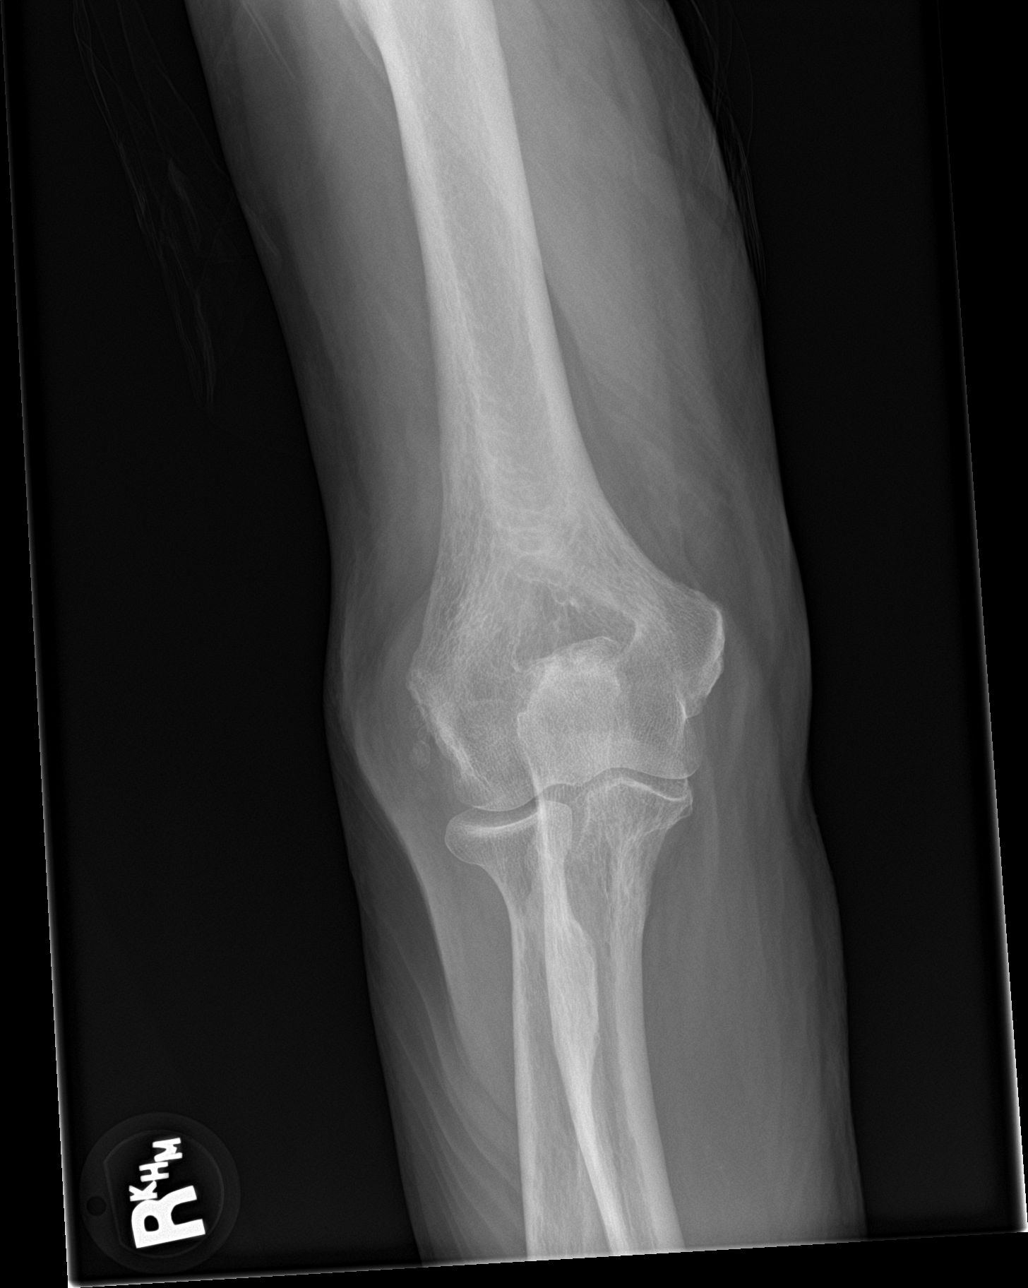

[elbow lat]
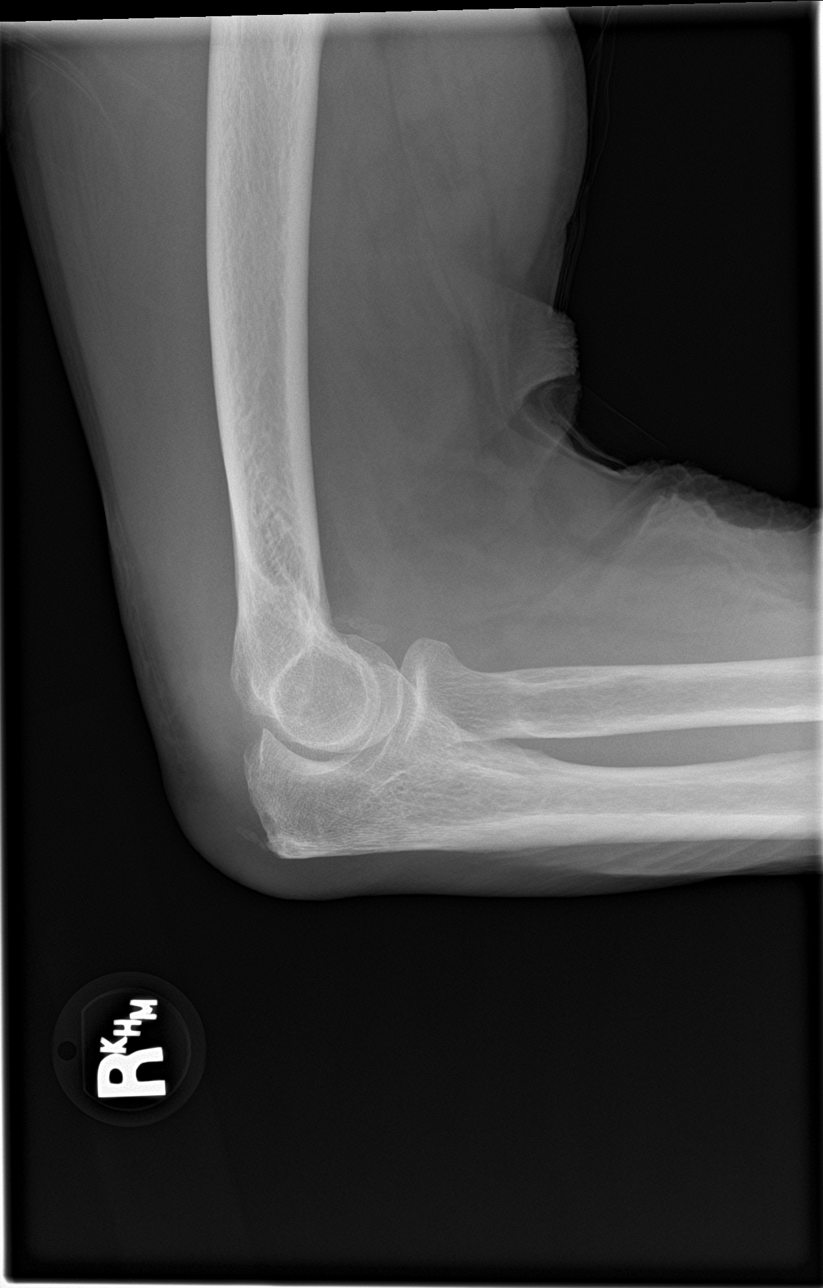

[elbow obl (2 of 2)]
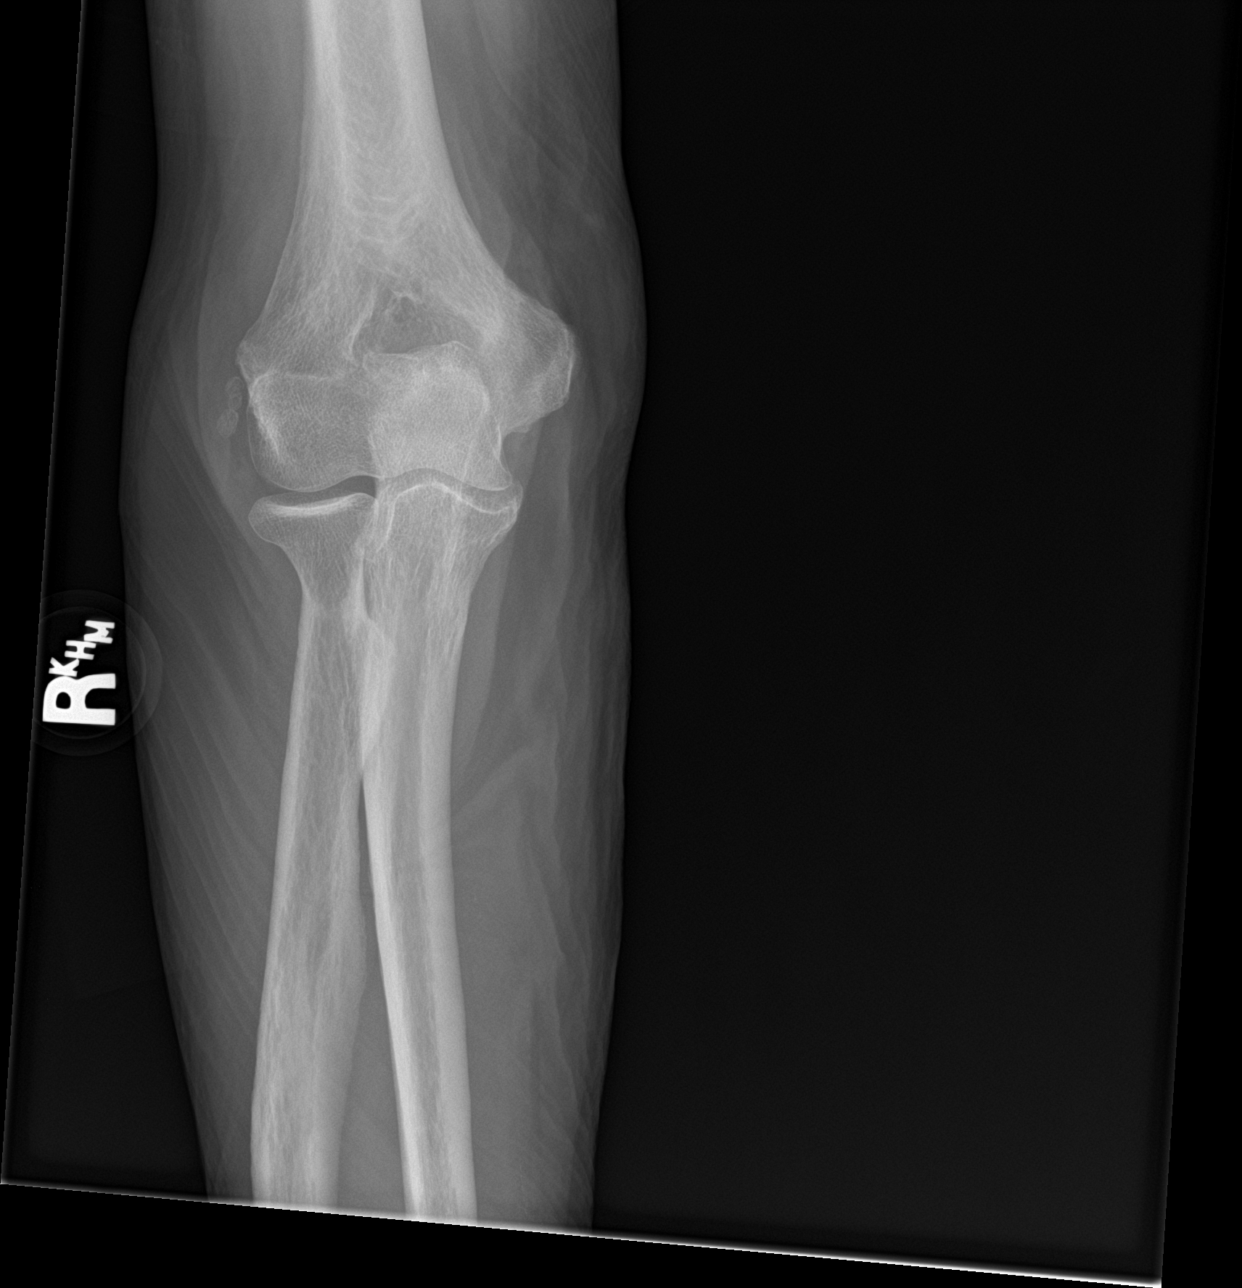

[4 of 4 positions shown; findings below may reference images not displayed]

FINDINGS: Diffuse osseous demineralization.

Joint spaces preserved.

No acute fracture, dislocation, or bone destruction.

Small calcified bodies noted at the elbow joint anterolaterally.

Question minimal elbow joint effusion.
IMPRESSION: Minimal elbow joint effusion questioned with small calcified bodies
at anterolaterally.

No acute osseous abnormalities.

## 2016-06-22 DIAGNOSIS — M1000 Idiopathic gout, unspecified site: Secondary | ICD-10-CM | POA: Diagnosis not present

## 2016-08-10 DIAGNOSIS — Z79899 Other long term (current) drug therapy: Secondary | ICD-10-CM | POA: Diagnosis not present

## 2016-08-10 DIAGNOSIS — C61 Malignant neoplasm of prostate: Secondary | ICD-10-CM | POA: Diagnosis not present

## 2016-08-10 DIAGNOSIS — I10 Essential (primary) hypertension: Secondary | ICD-10-CM | POA: Diagnosis not present

## 2016-08-10 DIAGNOSIS — M109 Gout, unspecified: Secondary | ICD-10-CM | POA: Diagnosis not present

## 2016-08-10 DIAGNOSIS — E875 Hyperkalemia: Secondary | ICD-10-CM | POA: Diagnosis not present

## 2016-08-14 DIAGNOSIS — Z23 Encounter for immunization: Secondary | ICD-10-CM | POA: Diagnosis not present

## 2016-08-14 DIAGNOSIS — M1000 Idiopathic gout, unspecified site: Secondary | ICD-10-CM | POA: Diagnosis not present

## 2016-08-14 DIAGNOSIS — I10 Essential (primary) hypertension: Secondary | ICD-10-CM | POA: Diagnosis not present

## 2016-08-14 DIAGNOSIS — N183 Chronic kidney disease, stage 3 (moderate): Secondary | ICD-10-CM | POA: Diagnosis not present

## 2016-09-25 DIAGNOSIS — I10 Essential (primary) hypertension: Secondary | ICD-10-CM | POA: Diagnosis not present

## 2016-09-25 DIAGNOSIS — J209 Acute bronchitis, unspecified: Secondary | ICD-10-CM | POA: Diagnosis not present

## 2016-10-10 DIAGNOSIS — H5213 Myopia, bilateral: Secondary | ICD-10-CM | POA: Diagnosis not present

## 2016-10-16 IMAGING — DX DG CHEST 2V
2 series · 2 of 2 positions shown · non-contrast
Comparison: 04/15/2015 chest radiograph.

CLINICAL DATA: Cough.

EXAM:
CHEST  2 VIEW

[chest pa]
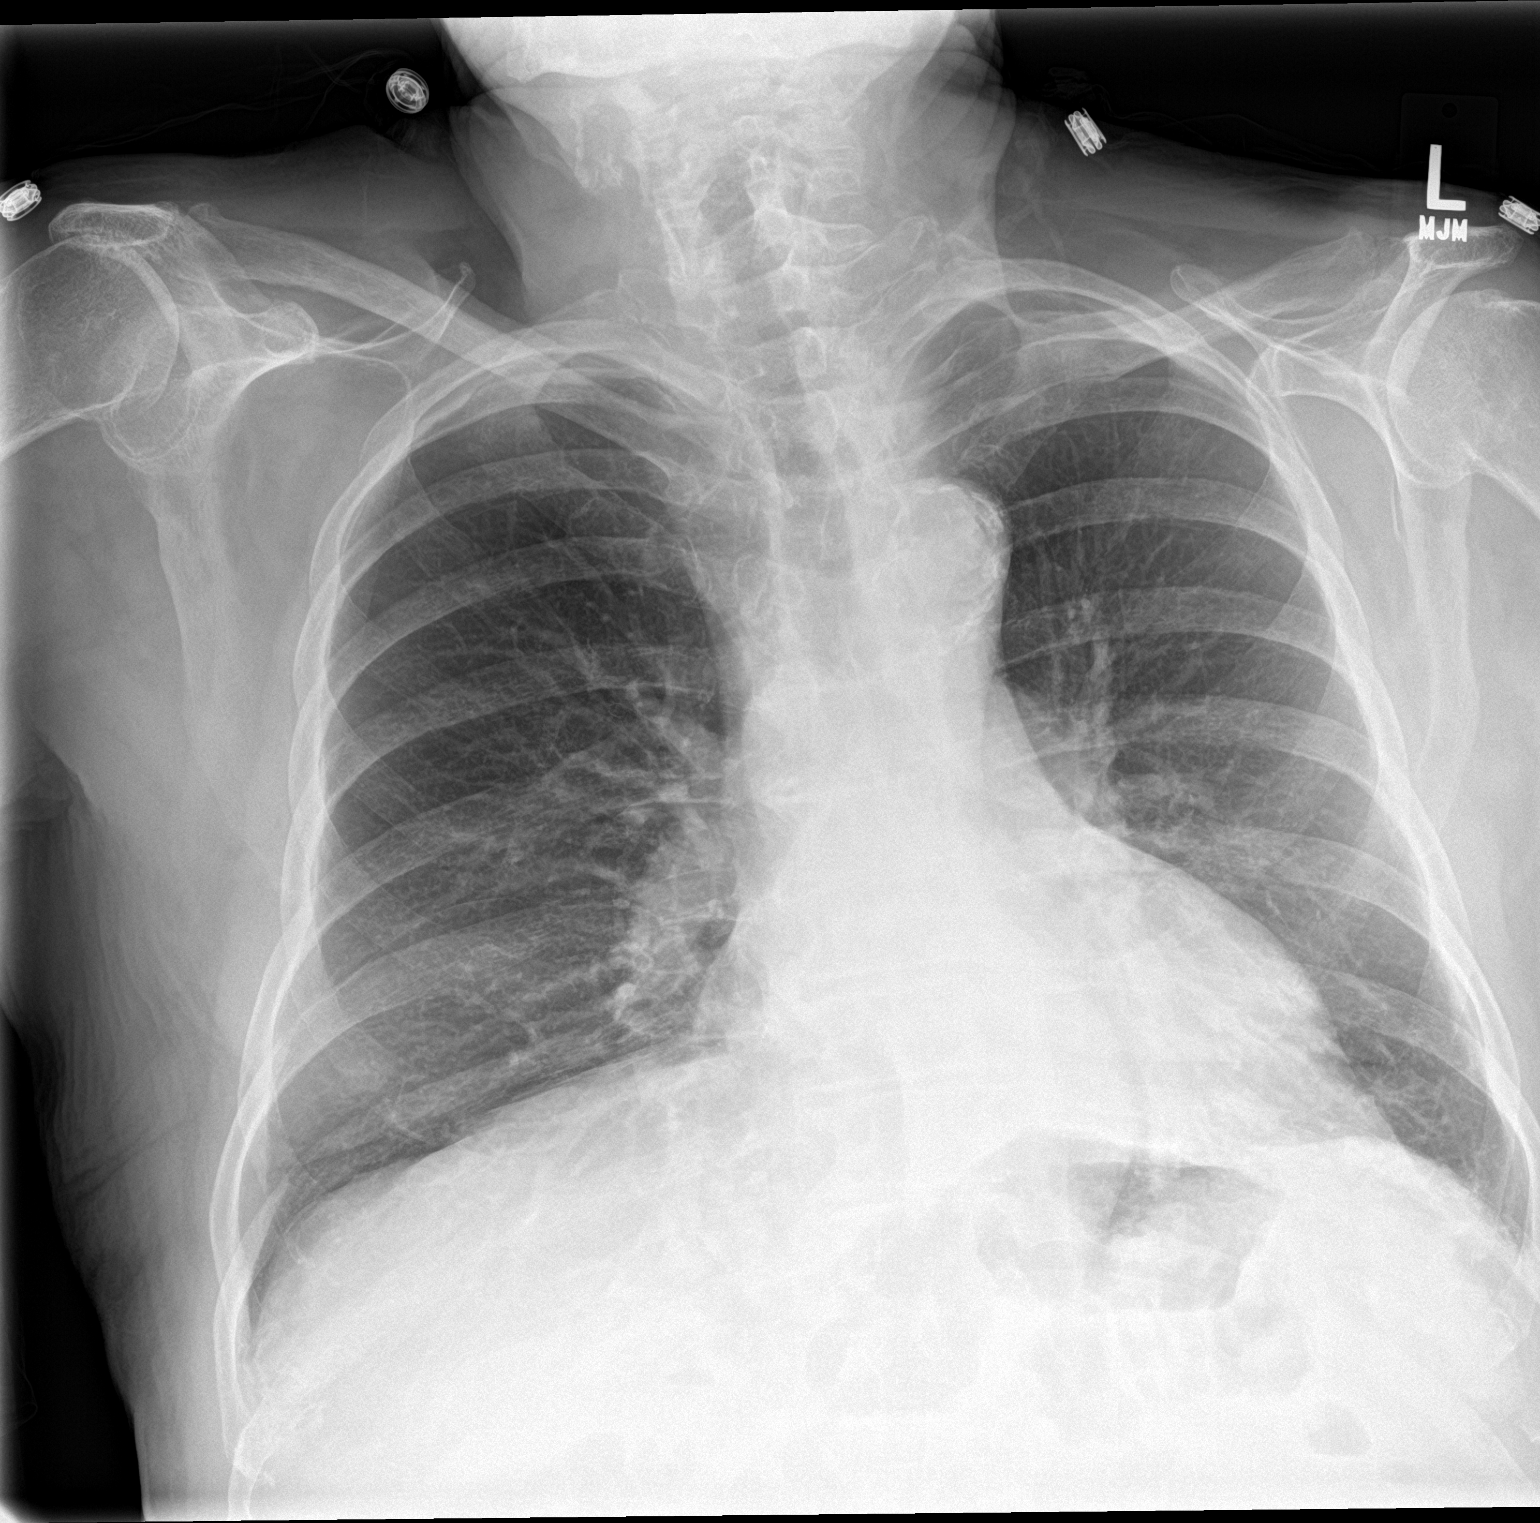

[chest lat]
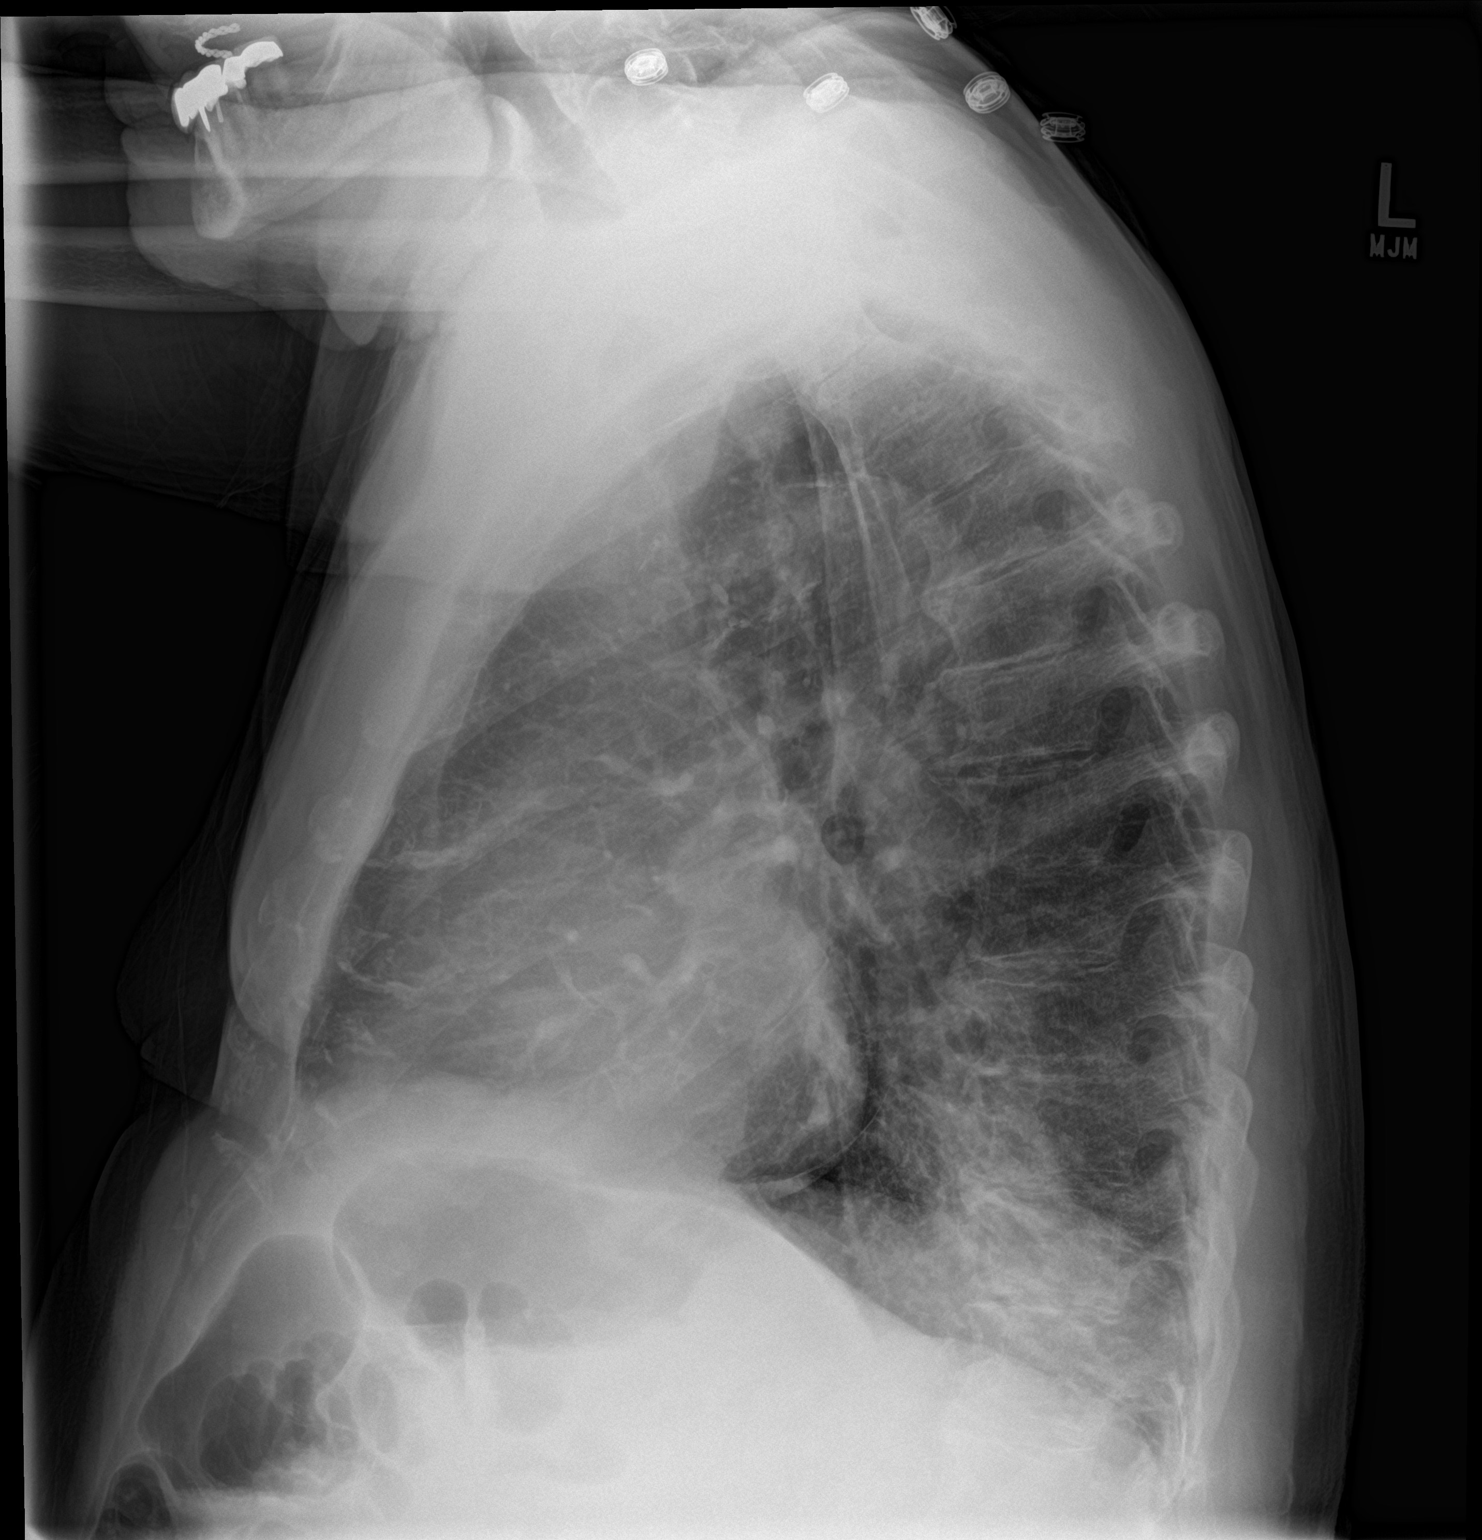

[2 of 2 positions shown; findings below may reference images not displayed]

FINDINGS: Stable cardiomediastinal silhouette with top-normal heart size. No
pneumothorax. No pleural effusion. There is new patchy consolidation
in the left lower lobe. No pulmonary edema.
IMPRESSION: New patchy left lower lobe consolidation, most in keeping with a
pneumonia in the correct clinical setting. Recommend follow-up PA
and lateral post treatment chest radiographs in 4-6 weeks.

## 2016-11-08 DIAGNOSIS — N189 Chronic kidney disease, unspecified: Secondary | ICD-10-CM | POA: Diagnosis not present

## 2016-11-08 DIAGNOSIS — M109 Gout, unspecified: Secondary | ICD-10-CM | POA: Diagnosis not present

## 2016-11-08 DIAGNOSIS — Z79899 Other long term (current) drug therapy: Secondary | ICD-10-CM | POA: Diagnosis not present

## 2016-11-16 DIAGNOSIS — N183 Chronic kidney disease, stage 3 (moderate): Secondary | ICD-10-CM | POA: Diagnosis not present

## 2016-11-16 DIAGNOSIS — I10 Essential (primary) hypertension: Secondary | ICD-10-CM | POA: Diagnosis not present

## 2017-02-16 DIAGNOSIS — Z79899 Other long term (current) drug therapy: Secondary | ICD-10-CM | POA: Diagnosis not present

## 2017-02-16 DIAGNOSIS — M109 Gout, unspecified: Secondary | ICD-10-CM | POA: Diagnosis not present

## 2017-02-16 DIAGNOSIS — I10 Essential (primary) hypertension: Secondary | ICD-10-CM | POA: Diagnosis not present

## 2017-02-23 DIAGNOSIS — N184 Chronic kidney disease, stage 4 (severe): Secondary | ICD-10-CM | POA: Diagnosis not present

## 2017-02-23 DIAGNOSIS — I10 Essential (primary) hypertension: Secondary | ICD-10-CM | POA: Diagnosis not present

## 2017-02-23 DIAGNOSIS — Z6828 Body mass index (BMI) 28.0-28.9, adult: Secondary | ICD-10-CM | POA: Diagnosis not present

## 2017-05-24 DIAGNOSIS — M109 Gout, unspecified: Secondary | ICD-10-CM | POA: Diagnosis not present

## 2017-05-24 DIAGNOSIS — Z79899 Other long term (current) drug therapy: Secondary | ICD-10-CM | POA: Diagnosis not present

## 2017-05-24 DIAGNOSIS — I10 Essential (primary) hypertension: Secondary | ICD-10-CM | POA: Diagnosis not present

## 2017-05-24 DIAGNOSIS — E875 Hyperkalemia: Secondary | ICD-10-CM | POA: Diagnosis not present

## 2017-06-15 DIAGNOSIS — I10 Essential (primary) hypertension: Secondary | ICD-10-CM | POA: Diagnosis not present

## 2017-06-15 DIAGNOSIS — N183 Chronic kidney disease, stage 3 (moderate): Secondary | ICD-10-CM | POA: Diagnosis not present

## 2017-09-14 DIAGNOSIS — I10 Essential (primary) hypertension: Secondary | ICD-10-CM | POA: Diagnosis not present

## 2017-09-14 DIAGNOSIS — N183 Chronic kidney disease, stage 3 (moderate): Secondary | ICD-10-CM | POA: Diagnosis not present

## 2017-09-14 DIAGNOSIS — Z79899 Other long term (current) drug therapy: Secondary | ICD-10-CM | POA: Diagnosis not present

## 2017-09-21 DIAGNOSIS — Z23 Encounter for immunization: Secondary | ICD-10-CM | POA: Diagnosis not present

## 2017-09-21 DIAGNOSIS — N183 Chronic kidney disease, stage 3 (moderate): Secondary | ICD-10-CM | POA: Diagnosis not present

## 2017-09-21 DIAGNOSIS — E1122 Type 2 diabetes mellitus with diabetic chronic kidney disease: Secondary | ICD-10-CM | POA: Diagnosis not present

## 2017-09-21 DIAGNOSIS — I10 Essential (primary) hypertension: Secondary | ICD-10-CM | POA: Diagnosis not present

## 2017-10-16 DIAGNOSIS — H353131 Nonexudative age-related macular degeneration, bilateral, early dry stage: Secondary | ICD-10-CM | POA: Diagnosis not present

## 2017-10-16 DIAGNOSIS — Z961 Presence of intraocular lens: Secondary | ICD-10-CM | POA: Diagnosis not present

## 2017-12-06 DIAGNOSIS — Z79899 Other long term (current) drug therapy: Secondary | ICD-10-CM | POA: Diagnosis not present

## 2017-12-06 DIAGNOSIS — N183 Chronic kidney disease, stage 3 (moderate): Secondary | ICD-10-CM | POA: Diagnosis not present

## 2017-12-06 DIAGNOSIS — I10 Essential (primary) hypertension: Secondary | ICD-10-CM | POA: Diagnosis not present

## 2017-12-06 DIAGNOSIS — E119 Type 2 diabetes mellitus without complications: Secondary | ICD-10-CM | POA: Diagnosis not present

## 2017-12-13 DIAGNOSIS — N183 Chronic kidney disease, stage 3 (moderate): Secondary | ICD-10-CM | POA: Diagnosis not present

## 2017-12-13 DIAGNOSIS — E119 Type 2 diabetes mellitus without complications: Secondary | ICD-10-CM | POA: Diagnosis not present

## 2017-12-13 DIAGNOSIS — M109 Gout, unspecified: Secondary | ICD-10-CM | POA: Diagnosis not present

## 2017-12-13 DIAGNOSIS — I10 Essential (primary) hypertension: Secondary | ICD-10-CM | POA: Diagnosis not present

## 2018-03-11 DIAGNOSIS — N183 Chronic kidney disease, stage 3 (moderate): Secondary | ICD-10-CM | POA: Diagnosis not present

## 2018-03-11 DIAGNOSIS — E119 Type 2 diabetes mellitus without complications: Secondary | ICD-10-CM | POA: Diagnosis not present

## 2018-03-11 DIAGNOSIS — M109 Gout, unspecified: Secondary | ICD-10-CM | POA: Diagnosis not present

## 2018-03-11 DIAGNOSIS — I10 Essential (primary) hypertension: Secondary | ICD-10-CM | POA: Diagnosis not present

## 2018-03-18 DIAGNOSIS — N183 Chronic kidney disease, stage 3 (moderate): Secondary | ICD-10-CM | POA: Diagnosis not present

## 2018-03-18 DIAGNOSIS — E119 Type 2 diabetes mellitus without complications: Secondary | ICD-10-CM | POA: Diagnosis not present

## 2018-03-18 DIAGNOSIS — M109 Gout, unspecified: Secondary | ICD-10-CM | POA: Diagnosis not present

## 2018-03-18 DIAGNOSIS — I10 Essential (primary) hypertension: Secondary | ICD-10-CM | POA: Diagnosis not present

## 2018-05-10 ENCOUNTER — Inpatient Hospital Stay (HOSPITAL_COMMUNITY): Payer: Medicare Other

## 2018-05-10 ENCOUNTER — Encounter (HOSPITAL_COMMUNITY): Payer: Self-pay | Admitting: *Deleted

## 2018-05-10 ENCOUNTER — Emergency Department (HOSPITAL_COMMUNITY): Payer: Medicare Other

## 2018-05-10 ENCOUNTER — Other Ambulatory Visit: Payer: Self-pay

## 2018-05-10 ENCOUNTER — Inpatient Hospital Stay (HOSPITAL_COMMUNITY)
Admission: EM | Admit: 2018-05-10 | Discharge: 2018-05-16 | DRG: 854 | Disposition: A | Discharge status: Home / Self Care | Payer: Medicare Other | Attending: Internal Medicine | Admitting: Internal Medicine

## 2018-05-10 DIAGNOSIS — I340 Nonrheumatic mitral (valve) insufficiency: Secondary | ICD-10-CM | POA: Diagnosis not present

## 2018-05-10 DIAGNOSIS — Z7901 Long term (current) use of anticoagulants: Secondary | ICD-10-CM

## 2018-05-10 DIAGNOSIS — K5750 Diverticulosis of both small and large intestine without perforation or abscess without bleeding: Secondary | ICD-10-CM | POA: Diagnosis not present

## 2018-05-10 DIAGNOSIS — K660 Peritoneal adhesions (postprocedural) (postinfection): Secondary | ICD-10-CM | POA: Diagnosis present

## 2018-05-10 DIAGNOSIS — E876 Hypokalemia: Secondary | ICD-10-CM | POA: Diagnosis not present

## 2018-05-10 DIAGNOSIS — Z23 Encounter for immunization: Secondary | ICD-10-CM | POA: Diagnosis not present

## 2018-05-10 DIAGNOSIS — K8031 Calculus of bile duct with cholangitis, unspecified, with obstruction: Secondary | ICD-10-CM | POA: Diagnosis not present

## 2018-05-10 DIAGNOSIS — I361 Nonrheumatic tricuspid (valve) insufficiency: Secondary | ICD-10-CM | POA: Diagnosis not present

## 2018-05-10 DIAGNOSIS — N289 Disorder of kidney and ureter, unspecified: Secondary | ICD-10-CM

## 2018-05-10 DIAGNOSIS — D6489 Other specified anemias: Secondary | ICD-10-CM | POA: Diagnosis not present

## 2018-05-10 DIAGNOSIS — I441 Atrioventricular block, second degree: Secondary | ICD-10-CM | POA: Diagnosis not present

## 2018-05-10 DIAGNOSIS — R748 Abnormal levels of other serum enzymes: Secondary | ICD-10-CM | POA: Diagnosis not present

## 2018-05-10 DIAGNOSIS — A419 Sepsis, unspecified organism: Secondary | ICD-10-CM

## 2018-05-10 DIAGNOSIS — K838 Other specified diseases of biliary tract: Secondary | ICD-10-CM | POA: Diagnosis not present

## 2018-05-10 DIAGNOSIS — K8021 Calculus of gallbladder without cholecystitis with obstruction: Secondary | ICD-10-CM | POA: Diagnosis not present

## 2018-05-10 DIAGNOSIS — R7989 Other specified abnormal findings of blood chemistry: Secondary | ICD-10-CM | POA: Diagnosis present

## 2018-05-10 DIAGNOSIS — I459 Conduction disorder, unspecified: Secondary | ICD-10-CM | POA: Diagnosis not present

## 2018-05-10 DIAGNOSIS — M109 Gout, unspecified: Secondary | ICD-10-CM | POA: Diagnosis present

## 2018-05-10 DIAGNOSIS — R945 Abnormal results of liver function studies: Secondary | ICD-10-CM

## 2018-05-10 DIAGNOSIS — I129 Hypertensive chronic kidney disease with stage 1 through stage 4 chronic kidney disease, or unspecified chronic kidney disease: Secondary | ICD-10-CM | POA: Diagnosis present

## 2018-05-10 DIAGNOSIS — K8061 Calculus of gallbladder and bile duct with cholecystitis, unspecified, with obstruction: Secondary | ICD-10-CM | POA: Diagnosis present

## 2018-05-10 DIAGNOSIS — K8001 Calculus of gallbladder with acute cholecystitis with obstruction: Secondary | ICD-10-CM | POA: Diagnosis not present

## 2018-05-10 DIAGNOSIS — R001 Bradycardia, unspecified: Secondary | ICD-10-CM | POA: Diagnosis not present

## 2018-05-10 DIAGNOSIS — A4151 Sepsis due to Escherichia coli [E. coli]: Principal | ICD-10-CM | POA: Diagnosis present

## 2018-05-10 DIAGNOSIS — I499 Cardiac arrhythmia, unspecified: Secondary | ICD-10-CM

## 2018-05-10 DIAGNOSIS — K8010 Calculus of gallbladder with chronic cholecystitis without obstruction: Secondary | ICD-10-CM | POA: Diagnosis not present

## 2018-05-10 DIAGNOSIS — K8030 Calculus of bile duct with cholangitis, unspecified, without obstruction: Secondary | ICD-10-CM | POA: Diagnosis not present

## 2018-05-10 DIAGNOSIS — K8020 Calculus of gallbladder without cholecystitis without obstruction: Secondary | ICD-10-CM | POA: Diagnosis present

## 2018-05-10 DIAGNOSIS — I444 Left anterior fascicular block: Secondary | ICD-10-CM | POA: Diagnosis present

## 2018-05-10 DIAGNOSIS — K8309 Other cholangitis: Secondary | ICD-10-CM | POA: Diagnosis not present

## 2018-05-10 DIAGNOSIS — N189 Chronic kidney disease, unspecified: Secondary | ICD-10-CM | POA: Diagnosis not present

## 2018-05-10 DIAGNOSIS — K8050 Calculus of bile duct without cholangitis or cholecystitis without obstruction: Secondary | ICD-10-CM | POA: Diagnosis not present

## 2018-05-10 DIAGNOSIS — Z86010 Personal history of colonic polyps: Secondary | ICD-10-CM

## 2018-05-10 DIAGNOSIS — K8301 Primary sclerosing cholangitis: Secondary | ICD-10-CM | POA: Diagnosis not present

## 2018-05-10 DIAGNOSIS — R1111 Vomiting without nausea: Secondary | ICD-10-CM | POA: Diagnosis not present

## 2018-05-10 DIAGNOSIS — Z7982 Long term (current) use of aspirin: Secondary | ICD-10-CM | POA: Diagnosis not present

## 2018-05-10 DIAGNOSIS — N183 Chronic kidney disease, stage 3 (moderate): Secondary | ICD-10-CM | POA: Diagnosis present

## 2018-05-10 DIAGNOSIS — R0902 Hypoxemia: Secondary | ICD-10-CM | POA: Diagnosis not present

## 2018-05-10 DIAGNOSIS — R404 Transient alteration of awareness: Secondary | ICD-10-CM | POA: Diagnosis not present

## 2018-05-10 DIAGNOSIS — I10 Essential (primary) hypertension: Secondary | ICD-10-CM | POA: Diagnosis present

## 2018-05-10 DIAGNOSIS — R0689 Other abnormalities of breathing: Secondary | ICD-10-CM | POA: Diagnosis not present

## 2018-05-10 DIAGNOSIS — R778 Other specified abnormalities of plasma proteins: Secondary | ICD-10-CM | POA: Diagnosis present

## 2018-05-10 DIAGNOSIS — E1165 Type 2 diabetes mellitus with hyperglycemia: Secondary | ICD-10-CM | POA: Diagnosis not present

## 2018-05-10 DIAGNOSIS — R4182 Altered mental status, unspecified: Secondary | ICD-10-CM | POA: Diagnosis not present

## 2018-05-10 LAB — URINALYSIS, ROUTINE W REFLEX MICROSCOPIC
Glucose, UA: NEGATIVE mg/dL
HGB URINE DIPSTICK: NEGATIVE
Ketones, ur: NEGATIVE mg/dL
LEUKOCYTES UA: NEGATIVE
NITRITE: NEGATIVE
PH: 6 (ref 5.0–8.0)
Protein, ur: 100 mg/dL — AB
SPECIFIC GRAVITY, URINE: 1.02 (ref 1.005–1.030)

## 2018-05-10 LAB — CBC WITH DIFFERENTIAL/PLATELET
BASOS PCT: 0 %
Basophils Absolute: 0 10*3/uL (ref 0.0–0.1)
EOS ABS: 0 10*3/uL (ref 0.0–0.7)
Eosinophils Relative: 0 %
HCT: 38.6 % — ABNORMAL LOW (ref 39.0–52.0)
HEMOGLOBIN: 12.6 g/dL — AB (ref 13.0–17.0)
LYMPHS ABS: 0.2 10*3/uL — AB (ref 0.7–4.0)
Lymphocytes Relative: 1 %
MCH: 33.1 pg (ref 26.0–34.0)
MCHC: 32.6 g/dL (ref 30.0–36.0)
MCV: 101.3 fL — ABNORMAL HIGH (ref 78.0–100.0)
Monocytes Absolute: 0.1 10*3/uL (ref 0.1–1.0)
Monocytes Relative: 1 %
NEUTROS PCT: 98 %
Neutro Abs: 19.8 10*3/uL — ABNORMAL HIGH (ref 1.7–7.7)
Platelets: 267 10*3/uL (ref 150–400)
RBC: 3.81 MIL/uL — AB (ref 4.22–5.81)
RDW: 14 % (ref 11.5–15.5)
WBC: 20.1 10*3/uL — AB (ref 4.0–10.5)

## 2018-05-10 LAB — TROPONIN I
TROPONIN I: 0.07 ng/mL — AB (ref <0.03)
Troponin I: 0.08 ng/mL (ref <0.03)

## 2018-05-10 LAB — COMPREHENSIVE METABOLIC PANEL
ALK PHOS: 251 U/L — AB (ref 38–126)
ALT: 132 U/L — ABNORMAL HIGH (ref 17–63)
ANION GAP: 17 — AB (ref 5–15)
AST: 227 U/L — ABNORMAL HIGH (ref 15–41)
Albumin: 3.5 g/dL (ref 3.5–5.0)
BILIRUBIN TOTAL: 4.5 mg/dL — AB (ref 0.3–1.2)
BUN: 21 mg/dL — ABNORMAL HIGH (ref 6–20)
CALCIUM: 8.7 mg/dL — AB (ref 8.9–10.3)
CO2: 20 mmol/L — ABNORMAL LOW (ref 22–32)
Chloride: 100 mmol/L — ABNORMAL LOW (ref 101–111)
Creatinine, Ser: 1.74 mg/dL — ABNORMAL HIGH (ref 0.61–1.24)
GFR calc non Af Amer: 33 mL/min — ABNORMAL LOW (ref >60)
GFR, EST AFRICAN AMERICAN: 38 mL/min — AB (ref >60)
Glucose, Bld: 230 mg/dL — ABNORMAL HIGH (ref 65–99)
Potassium: 3.6 mmol/L (ref 3.5–5.1)
Sodium: 137 mmol/L (ref 135–145)
TOTAL PROTEIN: 6.8 g/dL (ref 6.5–8.1)

## 2018-05-10 LAB — LACTIC ACID, PLASMA
LACTIC ACID, VENOUS: 7.1 mmol/L — AB (ref 0.5–1.9)
LACTIC ACID, VENOUS: 4.7 mmol/L — AB (ref 0.5–1.9)

## 2018-05-10 LAB — LIPASE, BLOOD: Lipase: 21 U/L (ref 11–51)

## 2018-05-10 MED ORDER — ACETAMINOPHEN 325 MG PO TABS: 650.0000 mg | ORAL_TABLET | Freq: Four times a day (QID) | ORAL | Status: DC | PRN

## 2018-05-10 MED ORDER — SODIUM CHLORIDE 0.9 % IV BOLUS
1000.0000 mL | Freq: Once | INTRAVENOUS | Status: AC
  Administered 2018-05-10: 1000 mL via INTRAVENOUS

## 2018-05-10 MED ORDER — ONDANSETRON HCL 4 MG PO TABS: 4.0000 mg | ORAL_TABLET | Freq: Four times a day (QID) | ORAL | Status: DC | PRN

## 2018-05-10 MED ORDER — SODIUM CHLORIDE 0.9 % IV BOLUS
500.0000 mL | Freq: Once | INTRAVENOUS | Status: AC
  Administered 2018-05-10: 500 mL via INTRAVENOUS

## 2018-05-10 MED ORDER — VANCOMYCIN HCL 10 G IV SOLR
1500.0000 mg | Freq: Once | INTRAVENOUS | Status: AC
  Administered 2018-05-10: 1500 mg via INTRAVENOUS
  Filled 2018-05-10: qty 1500

## 2018-05-10 MED ORDER — SODIUM CHLORIDE 0.9 % IV SOLN
INTRAVENOUS | Status: DC
Start: 2018-05-10 — End: 2018-05-14
  Administered 2018-05-10 – 2018-05-12 (×4): via INTRAVENOUS

## 2018-05-10 MED ORDER — VANCOMYCIN HCL IN DEXTROSE 1-5 GM/200ML-% IV SOLN: 1000.0000 mg | Freq: Once | INTRAVENOUS | Status: DC

## 2018-05-10 MED ORDER — ONDANSETRON HCL 4 MG/2ML IJ SOLN
4.0000 mg | Freq: Four times a day (QID) | INTRAMUSCULAR | Status: DC | PRN
  Administered 2018-05-13: 4 mg via INTRAVENOUS
  Filled 2018-05-10: qty 2

## 2018-05-10 MED ORDER — HEPARIN SODIUM (PORCINE) 5000 UNIT/ML IJ SOLN
5000.0000 [IU] | Freq: Once | INTRAMUSCULAR | Status: DC
  Filled 2018-05-10: qty 1

## 2018-05-10 MED ORDER — PIPERACILLIN-TAZOBACTAM 3.375 G IVPB
3.3750 g | Freq: Three times a day (TID) | INTRAVENOUS | Status: DC
Start: 2018-05-10 — End: 2018-05-14
  Administered 2018-05-10 – 2018-05-14 (×11): 3.375 g via INTRAVENOUS
  Filled 2018-05-10 (×13): qty 50

## 2018-05-10 MED ORDER — ASPIRIN EC 81 MG PO TBEC
81.0000 mg | DELAYED_RELEASE_TABLET | Freq: Every day | ORAL | Status: DC
  Administered 2018-05-11: 81 mg via ORAL
  Filled 2018-05-10: qty 1

## 2018-05-10 MED ORDER — PNEUMOCOCCAL VAC POLYVALENT 25 MCG/0.5ML IJ INJ
0.5000 mL | INJECTION | INTRAMUSCULAR | Status: AC
  Administered 2018-05-11: 0.5 mL via INTRAMUSCULAR
  Filled 2018-05-10: qty 0.5

## 2018-05-10 MED ORDER — ACETAMINOPHEN 650 MG RE SUPP: 650.0000 mg | Freq: Four times a day (QID) | RECTAL | Status: DC | PRN

## 2018-05-10 MED ORDER — PIPERACILLIN-TAZOBACTAM 3.375 G IVPB 30 MIN
3.3750 g | Freq: Once | INTRAVENOUS | Status: AC
  Administered 2018-05-10: 3.375 g via INTRAVENOUS
  Filled 2018-05-10: qty 50

## 2018-05-10 NOTE — ED Notes (Signed)
Pt attempted to urinate for urine sample, not able to at this time

## 2018-05-10 NOTE — ED Triage Notes (Signed)
Pt with 101.4 temp with [facility], called out for unresponsive at [facility] [place].  Pt a and o with ems.  Emesis x 1 in car.  18 g left ac cbg 261

## 2018-05-10 NOTE — ED Notes (Signed)
confirmed with lab that both sets of BC drawn prior to starting IV antibiotics Zosyn

## 2018-05-10 NOTE — ED Notes (Signed)
Pt diaphoretic at present, denies pain, RA sats at 90%, O2 applied via  at 2 L/M
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
## 2018-05-10 NOTE — ED Notes (Addendum)
Pt sent to MRI with 1/2 of second liter of NS infusing.  Once second bolus done a total of 2500 cc will have been infused.

## 2018-05-10 NOTE — ED Notes (Signed)
Patient transported to Ultrasound 

## 2018-05-10 NOTE — Progress Notes (Signed)
Pharmacy Antibiotic Note  Robert Cardenas is a 84 y.o. male admitted on 05/10/2018 with sepsis.  Pharmacy has been consulted for zosyn dosing.  Plan: Zosyn 3.375g IV q8h (4 hour infusion).  F/u cxs and clinical progress Monitor V/S, labs  Height: 5\' 9"  (175.3 cm) Weight: 185 lb (83.9 kg) IBW/kg (Calculated) : 70.7  Temp (24hrs), Avg:99.2 F (37.3 C), Min:98.1 F (36.7 C), Max:100.3 F (37.9 C)  Recent Labs  Lab 05/10/18 1408 05/10/18 1556  WBC 20.1*  --   CREATININE 1.74*  --   LATICACIDVEN 7.1* 4.7*    Estimated Creatinine Clearance: 28.8 mL/min (A) (by C-G formula based on SCr of 1.74 mg/dL (H)).    Antimicrobials this admission: Zosyn 6/7 >>  Vancomycin x 1 dose in ED 6/7  Dose adjustments this admission: n/a  Microbiology results: 6/7 BCx: pending  Thank you for allowing pharmacy to be a part of this patient's care.  Elder CyphersLorie Valinda Fedie, BS Loura Backharm D, [place] Clinical Pharmacist Pager [phone] 05/10/2018 6:51 PM

## 2018-05-10 NOTE — ED Notes (Signed)
Date and time results received: 05/10/18 5:07 PM  (use smartphrase ".now" to insert current time)  Test: Lactic Acid Critical Value: 4.7  Name of Provider Notified: [facility]  Orders Received? Or Actions Taken?: Orders Received - See Orders for details
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
## 2018-05-10 NOTE — H&P (View-Only) (Signed)
[facility] Consult  Reason for Referral: Cholecystitis, ? Cholangitis  Referring Physician:  Dr. Sabra Heck   Chief Complaint    Code Sepsis      Robert Cardenas is a 84 y.o. male.  HPI: Robert Cardenas is a very pleasant 84 yo who presented to the ED after being found in the [facility] parking lot slumped over. He reports having had left shoulder pain earlier in the day and attempting to see Dr. Willey Blade and then trying to go to the ED.  He ultimately tried to go home and ended up in the [facility] parking lot per report from this Son.  He has had some abdominal pain in the past week and required some baking soda and water treatment but denies any stomach pain currently.  He denies any nausea, vomiting.  On arrival to the ED he had a slight fever, was hypotensive and tachycardic with elevated LFTs.  He had a CT that demonstrates a large gallstone 5cm and fluid around the gallbladder.    Past Medical History:  Diagnosis Date  . Acute renal failure ([place])    04/2015  . Gout   . Hypertension     Past Surgical History:  Procedure Laterality Date  . PROSTATE SURGERY      History reviewed. No pertinent family history.  Social History   Tobacco Use  . Smoking status: Never Smoker  . Smokeless tobacco: Never Used  Substance Use Topics  . Alcohol use: No  . Drug use: No    Medications: I have reviewed the patient's current medications. No current facility-administered medications for this encounter.    Current Outpatient Medications  Medication Sig Dispense Refill Last Dose  . acetaminophen (TYLENOL) 325 MG tablet Take 2 tablets (650 mg total) by mouth every 6 (six) hours as needed for mild pain (or Fever >/= 101). 100 tablet 1 Taking  . allopurinol (ZYLOPRIM) 100 MG tablet Take 200 mg by mouth daily.   Taking  . amLODipine (NORVASC) 5 MG tablet Take 5 mg by mouth daily.   Taking  . aspirin EC 81 MG tablet Take 81 mg by mouth daily.     Marland Kitchen atenolol (TENORMIN) 25 MG tablet  Take 25 mg by mouth daily.   Taking  . azithromycin (ZITHROMAX) 250 MG tablet Take 1 tablet (250 mg total) by mouth daily. Take 1 tablet daily until finished starting 02/14/2016 4 tablet 0 Taking  . doxazosin (CARDURA) 8 MG tablet Take 4 mg by mouth daily.    Taking  . enoxaparin (LOVENOX) 150 MG/ML injection Inject 0.5 mLs (75 mg total) into the skin daily. 10 Syringe 0 Taking  . HYDROcodone-acetaminophen (NORCO/VICODIN) 5-325 MG tablet Take 1-2 tablets by mouth every 4 (four) hours as needed for moderate pain. 15 tablet 0 Taking  . traMADol (ULTRAM) 50 MG tablet Take 1 tablet (50 mg total) by mouth every 6 (six) hours as needed. 15 tablet 0 Taking  . vitamin C (ASCORBIC ACID) 500 MG tablet Take 500 mg by mouth daily.   Taking   No allergies reported -Care everywhere with no known allergies   ROS:  A comprehensive review of systems was negative except for: Constitutional: positive for fevers and confusion Gastrointestinal: positive for abdominal pain and "sore stomach" Musculoskeletal: positive for left shoulder pain  Blood pressure 100/61, pulse 63, temperature 100.3 F (37.9 C), temperature source Oral, resp. rate 17, height '5\' 9"'$  (1.753 m), weight 185 lb (83.9 kg), SpO2 96 %. Physical Exam  Constitutional: He  is oriented to person, place, and time. He appears well-developed and well-nourished.  HENT:  Head: Normocephalic.  Eyes: Pupils are equal, round, and reactive to light.  Neck: Normal range of motion.  Cardiovascular: Normal rate.  Pulmonary/Chest: Effort normal.  Abdominal: Soft. He exhibits no distension and no mass. There is tenderness. There is no rebound and no guarding.  RUQ tenderness with deep palpation  Musculoskeletal: He exhibits no edema.  Neurological: He is alert and oriented to person, place, and time.  Skin: Skin is warm and dry.  Psychiatric: He has a normal mood and affect. His behavior is normal. Judgment and thought content normal.  Vitals  reviewed.   Results: Results for orders placed or performed during the hospital encounter of 05/10/18 (from the past 48 hour(s))  CBC with Differential     Status: Abnormal   Collection Time: 05/10/18  2:08 PM  Result Value Ref Range   WBC 20.1 (H) 4.0 - 10.5 K/uL   RBC 3.81 (L) 4.22 - 5.81 MIL/uL   Hemoglobin 12.6 (L) 13.0 - 17.0 g/dL   HCT 38.6 (L) 39.0 - 52.0 %   MCV 101.3 (H) 78.0 - 100.0 fL   MCH 33.1 26.0 - 34.0 pg   MCHC 32.6 30.0 - 36.0 g/dL   RDW 14.0 11.5 - 15.5 %   Platelets 267 150 - 400 K/uL   Neutrophils Relative % 98 %   Neutro Abs 19.8 (H) 1.7 - 7.7 K/uL   Lymphocytes Relative 1 %   Lymphs Abs 0.2 (L) 0.7 - 4.0 K/uL   Monocytes Relative 1 %   Monocytes Absolute 0.1 0.1 - 1.0 K/uL   Eosinophils Relative 0 %   Eosinophils Absolute 0.0 0.0 - 0.7 K/uL   Basophils Relative 0 %   Basophils Absolute 0.0 0.0 - 0.1 K/uL    Comment: Performed at [facility], [address] [place], [place] [address]  Lactic acid, plasma     Status: Abnormal   Collection Time: 05/10/18  2:08 PM  Result Value Ref Range   Lactic Acid, Venous 7.1 (HH) 0.5 - 1.9 mmol/L    Comment: CRITICAL RESULT CALLED TO, READ BACK BY AND VERIFIED WITH: DANIELS,B ON 05/10/18 AT 1455 BY LOY,C Performed at [facility], [address] [place], [place] [address]   Troponin I     Status: Abnormal   Collection Time: 05/10/18  2:08 PM  Result Value Ref Range   Troponin I 0.07 (HH) <0.03 ng/mL    Comment: CRITICAL RESULT CALLED TO, READ BACK BY AND VERIFIED WITH: DANIELS,B ON 05/10/18 AT 1455 BY LOY,C Performed at [facility], [address], [place], [place] [address]   Comprehensive metabolic panel     Status: Abnormal   Collection Time: 05/10/18  2:08 PM  Result Value Ref Range   Sodium 137 135 - 145 mmol/L   Potassium 3.6 3.5 - 5.1 mmol/L   Chloride 100 (L) 101 - 111 mmol/L   CO2 20 (L) 22 - 32 mmol/L   Glucose, Bld 230 (H) 65 - 99 mg/dL   BUN 21 (H) 6 - 20 mg/dL   Creatinine, Ser 1.74 (H) 0.61 -  1.24 mg/dL   Calcium 8.7 (L) 8.9 - 10.3 mg/dL   Total Protein 6.8 6.5 - 8.1 g/dL   Albumin 3.5 3.5 - 5.0 g/dL   AST 227 (H) 15 - 41 U/L   ALT 132 (H) 17 - 63 U/L    Comment: RESULTS CONFIRMED BY MANUAL DILUTION   Alkaline  Phosphatase 251 (H) 38 - 126 U/L   Total Bilirubin 4.5 (H) 0.3 - 1.2 mg/dL   GFR calc non Af Amer 33 (L) >60 mL/min   GFR calc Af Amer 38 (L) >60 mL/min    Comment: (NOTE) The eGFR has been calculated using the CKD EPI equation. This calculation has not been validated in all clinical situations. eGFR's persistently <60 mL/min signify possible Chronic Kidney Disease.    Anion gap 17 (H) 5 - 15    Comment: Performed at [facility], [address], [place], [place] [address]  Blood Culture (routine x 2)     Status: None (Preliminary result)   Collection Time: 05/10/18  2:08 PM  Result Value Ref Range   Specimen Description      RIGHT ANTECUBITAL BOTTLES DRAWN AEROBIC AND ANAEROBIC   Special Requests      4252 Blood Culture adequate volume Performed at [facility], [address], [place], [place] [address]    Culture PENDING    Report Status PENDING   Blood Culture (routine x 2)     Status: None (Preliminary result)   Collection Time: 05/10/18  2:10 PM  Result Value Ref Range   Specimen Description      BLOOD LEFT HAND BOTTLES DRAWN AEROBIC AND ANAEROBIC   Special Requests      1521 Blood Culture adequate volume Performed at [facility], [address], [place], [place] [address]    Culture PENDING    Report Status PENDING   Lactic acid, plasma     Status: Abnormal   Collection Time: 05/10/18  3:56 PM  Result Value Ref Range   Lactic Acid, Venous 4.7 (HH) 0.5 - 1.9 mmol/L    Comment: CRITICAL RESULT CALLED TO, READ BACK BY AND VERIFIED WITH: PATRAW,B ON 05/10/18 AT 1705 BY LOY,C Performed at [facility], [address] [place], [place] [address]   Lipase, blood     Status: None   Collection Time: 05/10/18  3:56 PM  Result Value Ref Range   Lipase 21  11 - 51 U/L    Comment: Performed at [facility], [address], [place], [place] [address]  Urinalysis, Routine w reflex microscopic     Status: Abnormal   Collection Time: 05/10/18  4:00 PM  Result Value Ref Range   Color, Urine AMBER (A) YELLOW    Comment: BIOCHEMICALS MAY BE AFFECTED BY COLOR   APPearance CLEAR CLEAR   Specific Gravity, Urine 1.020 1.005 - 1.030   pH 6.0 5.0 - 8.0   Glucose, UA NEGATIVE NEGATIVE mg/dL   Hgb urine dipstick NEGATIVE NEGATIVE   Bilirubin Urine SMALL (A) NEGATIVE   Ketones, ur NEGATIVE NEGATIVE mg/dL   Protein, ur 100 (A) NEGATIVE mg/dL   Nitrite NEGATIVE NEGATIVE   Leukocytes, UA NEGATIVE NEGATIVE   RBC / HPF 0-5 0 - 5 RBC/hpf   WBC, UA 0-5 0 - 5 WBC/hpf   Bacteria, UA RARE (A) NONE SEEN   Squamous Epithelial / LPF 0-5 0 - 5   Hyaline Casts, UA PRESENT     Comment: Performed at [facility], [address] [place], [place] [address]   Personally reviewed CT and reviewed with Dr. Thornton Papas- large stone, fluid around gallbladder, CBD 13 mm and possible stone at the distal aspect, duodenal diverticula   Ct Abdomen Pelvis Wo Contrast  Result Date: 05/10/2018 CLINICAL DATA:  Fever.  Vomiting. EXAM: CT ABDOMEN AND PELVIS WITHOUT CONTRAST TECHNIQUE: Multidetector CT imaging of the abdomen and pelvis  was performed following the standard protocol without IV contrast. COMPARISON:  None. FINDINGS: Lower chest: Small amount of bilateral dependent atelectasis or scarring. Mildly enlarged heart. Minimal pericardial effusion with a maximum thickness of 6 mm. Atheromatous coronary artery calcifications. Hepatobiliary: Very large gallstone in the gallbladder measuring 5.5 cm in maximum diameter. No gallbladder wall thickening or pericholecystic fluid. Unremarkable liver. Pancreas: Unremarkable. No pancreatic ductal dilatation or surrounding inflammatory changes. Spleen: Normal in size without focal abnormality. Adrenals/Urinary Tract: Adrenal glands are unremarkable. Kidneys  are normal, without renal calculi, focal lesion, or hydronephrosis. Left posterolateral bladder diverticulum. Stomach/Bowel: Small hiatal hernia. Mild colonic diverticulosis without evidence of diverticulitis. Normal appearing small bowel. No evidence of appendicitis. Vascular/Lymphatic: Atheromatous arterial calcifications without aneurysm. No enlarged lymph nodes. Reproductive: Multiple prostate radiation seed implants. Other: Small bilateral inguinal hernias containing fat. Small umbilical hernia containing fat. Musculoskeletal: Extensive lumbar and lower thoracic spine degenerative changes. IMPRESSION: 1. No acute abnormality. 2. Very large gallstone in the gallbladder without evidence of cholecystitis. 3. Small hiatal hernia. 4. Mild colonic diverticulosis. 5. Left bladder diverticulum. 6. Mild cardiomegaly and minimal pericardial effusion. 7.  Calcific atherosclerosis, including the coronary arteries. Electronically Signed   By: Claudie Revering M.D.   On: 05/10/2018 15:38   Dg Chest Port 1 View  Result Date: 05/10/2018 CLINICAL DATA:  Fever.  Sepsis. EXAM: PORTABLE CHEST 1 VIEW COMPARISON:  None. FINDINGS: Borderline enlarged cardiac silhouette. Mildly elevated right hemidiaphragm. Small amount of bilateral pleural thickening or fluid. Minimal bibasilar linear density. Mildly prominent pulmonary vasculature. Thoracic spine degenerative changes. Bilateral shoulder degenerative changes with superior migration of the right humeral head. IMPRESSION: 1. Borderline cardiomegaly and mild pulmonary vascular congestion. 2. Small amount of bilateral pleural thickening or fluid. 3. Minimal bibasilar atelectasis. 4. Bilateral shoulder degenerative changes with evidence of a large, chronic rotator cuff tear on the right. Electronically Signed   By: Claudie Revering M.D.   On: 05/10/2018 14:23     Assessment & Plan:  Robert Cardenas is a 84 y.o. male with a large gallstone and leukocytosis and possible ascending  cholangitis from choledocholithiasis? Have discussed with radiology, will order [place] to assess and may need to get an MRCP due to the duodenal diverticula.   -Admit to Medicine  -Zosyn good for coverage  -[place] and possible MRCP if [place] equivocal  -Trend labs, LFTs -Troponin elevated to 0.07, will need to trend and determine cardiac status with left shoulder pain -Lactic acid elevated, repeat improving -Will possibly need GI, ERCP if US/ MRCP positive  -Will discuss operative intervention for gallbladder further once sort out the choledocholithiasis aspect, cardiac issues   Discussed with family and Dr. Nehemiah Settle.   All questions were answered to the satisfaction of the patient and family.    Virl Cagey 05/10/2018, 5:23 PM

## 2018-05-10 NOTE — Consult Note (Signed)
[facility] Consult  Reason for Referral: Cholecystitis, ? Cholangitis  Referring Physician:  Dr. Sabra Heck   Chief Complaint    Code Sepsis      Robert Cardenas is a 84 y.o. male.  HPI: Robert Cardenas is a very pleasant 84 yo who presented to the ED after being found in the [facility] parking lot slumped over. He reports having had left shoulder pain earlier in the day and attempting to see Dr. Willey Blade and then trying to go to the ED.  He ultimately tried to go home and ended up in the [facility] parking lot per report from this Son.  He has had some abdominal pain in the past week and required some baking soda and water treatment but denies any stomach pain currently.  He denies any nausea, vomiting.  On arrival to the ED he had a slight fever, was hypotensive and tachycardic with elevated LFTs.  He had a CT that demonstrates a large gallstone 5cm and fluid around the gallbladder.    Past Medical History:  Diagnosis Date  . Acute renal failure ([place])    04/2015  . Gout   . Hypertension     Past Surgical History:  Procedure Laterality Date  . PROSTATE SURGERY      History reviewed. No pertinent family history.  Social History   Tobacco Use  . Smoking status: Never Smoker  . Smokeless tobacco: Never Used  Substance Use Topics  . Alcohol use: No  . Drug use: No    Medications: I have reviewed the patient's current medications. No current facility-administered medications for this encounter.    Current Outpatient Medications  Medication Sig Dispense Refill Last Dose  . acetaminophen (TYLENOL) 325 MG tablet Take 2 tablets (650 mg total) by mouth every 6 (six) hours as needed for mild pain (or Fever >/= 101). 100 tablet 1 Taking  . allopurinol (ZYLOPRIM) 100 MG tablet Take 200 mg by mouth daily.   Taking  . amLODipine (NORVASC) 5 MG tablet Take 5 mg by mouth daily.   Taking  . aspirin EC 81 MG tablet Take 81 mg by mouth daily.     Marland Kitchen atenolol (TENORMIN) 25 MG tablet  Take 25 mg by mouth daily.   Taking  . azithromycin (ZITHROMAX) 250 MG tablet Take 1 tablet (250 mg total) by mouth daily. Take 1 tablet daily until finished starting 02/14/2016 4 tablet 0 Taking  . doxazosin (CARDURA) 8 MG tablet Take 4 mg by mouth daily.    Taking  . enoxaparin (LOVENOX) 150 MG/ML injection Inject 0.5 mLs (75 mg total) into the skin daily. 10 Syringe 0 Taking  . HYDROcodone-acetaminophen (NORCO/VICODIN) 5-325 MG tablet Take 1-2 tablets by mouth every 4 (four) hours as needed for moderate pain. 15 tablet 0 Taking  . traMADol (ULTRAM) 50 MG tablet Take 1 tablet (50 mg total) by mouth every 6 (six) hours as needed. 15 tablet 0 Taking  . vitamin C (ASCORBIC ACID) 500 MG tablet Take 500 mg by mouth daily.   Taking   No allergies reported -Care everywhere with no known allergies   ROS:  A comprehensive review of systems was negative except for: Constitutional: positive for fevers and confusion Gastrointestinal: positive for abdominal pain and "sore stomach" Musculoskeletal: positive for left shoulder pain  Blood pressure 100/61, pulse 63, temperature 100.3 F (37.9 C), temperature source Oral, resp. rate 17, height '5\' 9"'$  (1.753 m), weight 185 lb (83.9 kg), SpO2 96 %. Physical Exam  Constitutional: He  is oriented to person, place, and time. He appears well-developed and well-nourished.  HENT:  Head: Normocephalic.  Eyes: Pupils are equal, round, and reactive to light.  Neck: Normal range of motion.  Cardiovascular: Normal rate.  Pulmonary/Chest: Effort normal.  Abdominal: Soft. He exhibits no distension and no mass. There is tenderness. There is no rebound and no guarding.  RUQ tenderness with deep palpation  Musculoskeletal: He exhibits no edema.  Neurological: He is alert and oriented to person, place, and time.  Skin: Skin is warm and dry.  Psychiatric: He has a normal mood and affect. His behavior is normal. Judgment and thought content normal.  Vitals  reviewed.   Results: Results for orders placed or performed during the hospital encounter of 05/10/18 (from the past 48 hour(s))  CBC with Differential     Status: Abnormal   Collection Time: 05/10/18  2:08 PM  Result Value Ref Range   WBC 20.1 (H) 4.0 - 10.5 K/uL   RBC 3.81 (L) 4.22 - 5.81 MIL/uL   Hemoglobin 12.6 (L) 13.0 - 17.0 g/dL   HCT 38.6 (L) 39.0 - 52.0 %   MCV 101.3 (H) 78.0 - 100.0 fL   MCH 33.1 26.0 - 34.0 pg   MCHC 32.6 30.0 - 36.0 g/dL   RDW 14.0 11.5 - 15.5 %   Platelets 267 150 - 400 K/uL   Neutrophils Relative % 98 %   Neutro Abs 19.8 (H) 1.7 - 7.7 K/uL   Lymphocytes Relative 1 %   Lymphs Abs 0.2 (L) 0.7 - 4.0 K/uL   Monocytes Relative 1 %   Monocytes Absolute 0.1 0.1 - 1.0 K/uL   Eosinophils Relative 0 %   Eosinophils Absolute 0.0 0.0 - 0.7 K/uL   Basophils Relative 0 %   Basophils Absolute 0.0 0.0 - 0.1 K/uL    Comment: Performed at [facility], [address] [place], [place] [address]  Lactic acid, plasma     Status: Abnormal   Collection Time: 05/10/18  2:08 PM  Result Value Ref Range   Lactic Acid, Venous 7.1 (HH) 0.5 - 1.9 mmol/L    Comment: CRITICAL RESULT CALLED TO, READ BACK BY AND VERIFIED WITH: DANIELS,B ON 05/10/18 AT 1455 BY LOY,C Performed at [facility], [address] [place], [place] [address]   Troponin I     Status: Abnormal   Collection Time: 05/10/18  2:08 PM  Result Value Ref Range   Troponin I 0.07 (HH) <0.03 ng/mL    Comment: CRITICAL RESULT CALLED TO, READ BACK BY AND VERIFIED WITH: DANIELS,B ON 05/10/18 AT 1455 BY LOY,C Performed at [facility], [address], [place], [place] [address]   Comprehensive metabolic panel     Status: Abnormal   Collection Time: 05/10/18  2:08 PM  Result Value Ref Range   Sodium 137 135 - 145 mmol/L   Potassium 3.6 3.5 - 5.1 mmol/L   Chloride 100 (L) 101 - 111 mmol/L   CO2 20 (L) 22 - 32 mmol/L   Glucose, Bld 230 (H) 65 - 99 mg/dL   BUN 21 (H) 6 - 20 mg/dL   Creatinine, Ser 1.74 (H) 0.61 -  1.24 mg/dL   Calcium 8.7 (L) 8.9 - 10.3 mg/dL   Total Protein 6.8 6.5 - 8.1 g/dL   Albumin 3.5 3.5 - 5.0 g/dL   AST 227 (H) 15 - 41 U/L   ALT 132 (H) 17 - 63 U/L    Comment: RESULTS CONFIRMED BY MANUAL DILUTION   Alkaline  Phosphatase 251 (H) 38 - 126 U/L   Total Bilirubin 4.5 (H) 0.3 - 1.2 mg/dL   GFR calc non Af Amer 33 (L) >60 mL/min   GFR calc Af Amer 38 (L) >60 mL/min    Comment: (NOTE) The eGFR has been calculated using the CKD EPI equation. This calculation has not been validated in all clinical situations. eGFR's persistently <60 mL/min signify possible Chronic Kidney Disease.    Anion gap 17 (H) 5 - 15    Comment: Performed at [facility], [address] [place], [place] [address]  Blood Culture (routine x 2)     Status: None (Preliminary result)   Collection Time: 05/10/18  2:08 PM  Result Value Ref Range   Specimen Description      RIGHT ANTECUBITAL BOTTLES DRAWN AEROBIC AND ANAEROBIC   Special Requests      4252 Blood Culture adequate volume Performed at [facility], [address], [place], [place] [address]    Culture PENDING    Report Status PENDING   Blood Culture (routine x 2)     Status: None (Preliminary result)   Collection Time: 05/10/18  2:10 PM  Result Value Ref Range   Specimen Description      BLOOD LEFT HAND BOTTLES DRAWN AEROBIC AND ANAEROBIC   Special Requests      1521 Blood Culture adequate volume Performed at [facility], [address], [place], [place] [address]    Culture PENDING    Report Status PENDING   Lactic acid, plasma     Status: Abnormal   Collection Time: 05/10/18  3:56 PM  Result Value Ref Range   Lactic Acid, Venous 4.7 (HH) 0.5 - 1.9 mmol/L    Comment: CRITICAL RESULT CALLED TO, READ BACK BY AND VERIFIED WITH: PATRAW,B ON 05/10/18 AT 1705 BY LOY,C Performed at [facility], [address] [place], [place] [address]   Lipase, blood     Status: None   Collection Time: 05/10/18  3:56 PM  Result Value Ref Range   Lipase 21  11 - 51 U/L    Comment: Performed at [facility], [address], [place], [place] [address]  Urinalysis, Routine w reflex microscopic     Status: Abnormal   Collection Time: 05/10/18  4:00 PM  Result Value Ref Range   Color, Urine AMBER (A) YELLOW    Comment: BIOCHEMICALS MAY BE AFFECTED BY COLOR   APPearance CLEAR CLEAR   Specific Gravity, Urine 1.020 1.005 - 1.030   pH 6.0 5.0 - 8.0   Glucose, UA NEGATIVE NEGATIVE mg/dL   Hgb urine dipstick NEGATIVE NEGATIVE   Bilirubin Urine SMALL (A) NEGATIVE   Ketones, ur NEGATIVE NEGATIVE mg/dL   Protein, ur 100 (A) NEGATIVE mg/dL   Nitrite NEGATIVE NEGATIVE   Leukocytes, UA NEGATIVE NEGATIVE   RBC / HPF 0-5 0 - 5 RBC/hpf   WBC, UA 0-5 0 - 5 WBC/hpf   Bacteria, UA RARE (A) NONE SEEN   Squamous Epithelial / LPF 0-5 0 - 5   Hyaline Casts, UA PRESENT     Comment: Performed at [facility], [address] [place], [place] [address]   Personally reviewed CT and reviewed with Dr. Thornton Papas- large stone, fluid around gallbladder, CBD 13 mm and possible stone at the distal aspect, duodenal diverticula   Ct Abdomen Pelvis Wo Contrast  Result Date: 05/10/2018 CLINICAL DATA:  Fever.  Vomiting. EXAM: CT ABDOMEN AND PELVIS WITHOUT CONTRAST TECHNIQUE: Multidetector CT imaging of the abdomen and pelvis  was performed following the standard protocol without IV contrast. COMPARISON:  None. FINDINGS: Lower chest: Small amount of bilateral dependent atelectasis or scarring. Mildly enlarged heart. Minimal pericardial effusion with a maximum thickness of 6 mm. Atheromatous coronary artery calcifications. Hepatobiliary: Very large gallstone in the gallbladder measuring 5.5 cm in maximum diameter. No gallbladder wall thickening or pericholecystic fluid. Unremarkable liver. Pancreas: Unremarkable. No pancreatic ductal dilatation or surrounding inflammatory changes. Spleen: Normal in size without focal abnormality. Adrenals/Urinary Tract: Adrenal glands are unremarkable. Kidneys  are normal, without renal calculi, focal lesion, or hydronephrosis. Left posterolateral bladder diverticulum. Stomach/Bowel: Small hiatal hernia. Mild colonic diverticulosis without evidence of diverticulitis. Normal appearing small bowel. No evidence of appendicitis. Vascular/Lymphatic: Atheromatous arterial calcifications without aneurysm. No enlarged lymph nodes. Reproductive: Multiple prostate radiation seed implants. Other: Small bilateral inguinal hernias containing fat. Small umbilical hernia containing fat. Musculoskeletal: Extensive lumbar and lower thoracic spine degenerative changes. IMPRESSION: 1. No acute abnormality. 2. Very large gallstone in the gallbladder without evidence of cholecystitis. 3. Small hiatal hernia. 4. Mild colonic diverticulosis. 5. Left bladder diverticulum. 6. Mild cardiomegaly and minimal pericardial effusion. 7.  Calcific atherosclerosis, including the coronary arteries. Electronically Signed   By: Claudie Revering M.D.   On: 05/10/2018 15:38   Dg Chest Port 1 View  Result Date: 05/10/2018 CLINICAL DATA:  Fever.  Sepsis. EXAM: PORTABLE CHEST 1 VIEW COMPARISON:  None. FINDINGS: Borderline enlarged cardiac silhouette. Mildly elevated right hemidiaphragm. Small amount of bilateral pleural thickening or fluid. Minimal bibasilar linear density. Mildly prominent pulmonary vasculature. Thoracic spine degenerative changes. Bilateral shoulder degenerative changes with superior migration of the right humeral head. IMPRESSION: 1. Borderline cardiomegaly and mild pulmonary vascular congestion. 2. Small amount of bilateral pleural thickening or fluid. 3. Minimal bibasilar atelectasis. 4. Bilateral shoulder degenerative changes with evidence of a large, chronic rotator cuff tear on the right. Electronically Signed   By: Claudie Revering M.D.   On: 05/10/2018 14:23     Assessment & Plan:  Robert Cardenas is a 84 y.o. male with a large gallstone and leukocytosis and possible ascending  cholangitis from choledocholithiasis? Have discussed with radiology, will order [place] to assess and may need to get an MRCP due to the duodenal diverticula.   -Admit to Medicine  -Zosyn good for coverage  -[place] and possible MRCP if [place] equivocal  -Trend labs, LFTs -Troponin elevated to 0.07, will need to trend and determine cardiac status with left shoulder pain -Lactic acid elevated, repeat improving -Will possibly need GI, ERCP if US/ MRCP positive  -Will discuss operative intervention for gallbladder further once sort out the choledocholithiasis aspect, cardiac issues   Discussed with family and Dr. Nehemiah Settle.   All questions were answered to the satisfaction of the patient and family.    Virl Cagey 05/10/2018, 5:23 PM

## 2018-05-11 DIAGNOSIS — R945 Abnormal results of liver function studies: Secondary | ICD-10-CM

## 2018-05-11 DIAGNOSIS — K8020 Calculus of gallbladder without cholecystitis without obstruction: Secondary | ICD-10-CM

## 2018-05-11 DIAGNOSIS — K8050 Calculus of bile duct without cholangitis or cholecystitis without obstruction: Secondary | ICD-10-CM

## 2018-05-11 LAB — COMPREHENSIVE METABOLIC PANEL
ALBUMIN: 2.7 g/dL — AB (ref 3.5–5.0)
ALT: 82 U/L — AB (ref 17–63)
AST: 103 U/L — AB (ref 15–41)
Alkaline Phosphatase: 140 U/L — ABNORMAL HIGH (ref 38–126)
Anion gap: 6 (ref 5–15)
BUN: 23 mg/dL — ABNORMAL HIGH (ref 6–20)
CHLORIDE: 109 mmol/L (ref 101–111)
CO2: 25 mmol/L (ref 22–32)
CREATININE: 1.51 mg/dL — AB (ref 0.61–1.24)
Calcium: 7.8 mg/dL — ABNORMAL LOW (ref 8.9–10.3)
GFR calc Af Amer: 45 mL/min — ABNORMAL LOW (ref >60)
GFR, EST NON AFRICAN AMERICAN: 39 mL/min — AB (ref >60)
GLUCOSE: 105 mg/dL — AB (ref 65–99)
POTASSIUM: 3.4 mmol/L — AB (ref 3.5–5.1)
Sodium: 140 mmol/L (ref 135–145)
Total Bilirubin: 4.1 mg/dL — ABNORMAL HIGH (ref 0.3–1.2)
Total Protein: 5.3 g/dL — ABNORMAL LOW (ref 6.5–8.1)

## 2018-05-11 LAB — CBC
HCT: 32.2 % — ABNORMAL LOW (ref 39.0–52.0)
Hemoglobin: 10.5 g/dL — ABNORMAL LOW (ref 13.0–17.0)
MCH: 33.3 pg (ref 26.0–34.0)
MCHC: 32.6 g/dL (ref 30.0–36.0)
MCV: 102.2 fL — ABNORMAL HIGH (ref 78.0–100.0)
PLATELETS: 212 10*3/uL (ref 150–400)
RBC: 3.15 MIL/uL — AB (ref 4.22–5.81)
RDW: 14.4 % (ref 11.5–15.5)
WBC: 22.8 10*3/uL — AB (ref 4.0–10.5)

## 2018-05-11 LAB — TROPONIN I
TROPONIN I: 0.07 ng/mL — AB (ref <0.03)
Troponin I: 0.06 ng/mL (ref <0.03)

## 2018-05-11 LAB — LACTIC ACID, PLASMA: LACTIC ACID, VENOUS: 1.3 mmol/L (ref 0.5–1.9)

## 2018-05-11 MED ORDER — POTASSIUM CHLORIDE 20 MEQ/15ML (10%) PO SOLN
40.0000 meq | Freq: Once | ORAL | Status: AC
  Administered 2018-05-11: 40 meq via ORAL
  Filled 2018-05-11: qty 30

## 2018-05-11 MED ORDER — POTASSIUM CHLORIDE 10 MEQ/100ML IV SOLN: 10.0000 meq | INTRAVENOUS | Status: DC

## 2018-05-11 NOTE — Progress Notes (Signed)
[facility] Progress Note     Subjective: Says he is feeling better. MRCP/ [place] with likely choledocholithiasis. Dr. Karilyn Cota agrees. Some pain in the upper stomach.   Objective: Vital signs in last 24 hours: Temp:  [97.6 F (36.4 C)-100.3 F (37.9 C)] 97.9 F (36.6 C) (06/08 0552) Pulse Rate:  [51-137] 71 (06/08 0552) Resp:  [13-32] 18 (06/08 0552) BP: (97-127)/(48-89) 101/59 (06/08 0552) SpO2:  [90 %-97 %] 96 % (06/08 0552) Weight:  [175 lb 0.7 oz (79.4 kg)-185 lb (83.9 kg)] 175 lb 0.7 oz (79.4 kg) (06/07 1925)    Intake/Output from previous day: 06/07 0701 - 06/08 0700 In: 3125 [I.V.:1025; IV Piggyback:2100] Out: 150 [Urine:150] Intake/Output this shift: Total I/O In: -  Out: 200 [Urine:200]  General appearance: alert, cooperative and no distress Resp: normal work breathing GI: soft, tender in the upper quadrants R> L, no rebound or guarding  Lab Results:  Recent Labs    05/10/18 1408 05/11/18 0623  WBC 20.1* 22.8*  HGB 12.6* 10.5*  HCT 38.6* 32.2*  PLT 267 212   BMET Recent Labs    05/10/18 1408 05/11/18 0623  NA 137 140  K 3.6 3.4*  CL 100* 109  CO2 20* 25  GLUCOSE 230* 105*  BUN 21* 23*  CREATININE 1.74* 1.51*  CALCIUM 8.7* 7.8*   PT/INR No results for input(s): LABPROT, INR in the last 72 hours.  Studies/Results: Ct Abdomen Pelvis Wo Contrast  Addendum Date: 05/10/2018   ADDENDUM REPORT: 05/10/2018 18:29 ADDENDUM: On coronal image number 40, there is a 7 mm distal common duct stone. On sagittal image number 64, there is a possible 2nd much smaller distal common duct stone more distally with dilatation of the common duct. Electronically Signed   By: Beckie Salts M.D.   On: 05/10/2018 18:29   Result Date: 05/10/2018 CLINICAL DATA:  Fever.  Vomiting. EXAM: CT ABDOMEN AND PELVIS WITHOUT CONTRAST TECHNIQUE: Multidetector CT imaging of the abdomen and pelvis was performed following the standard protocol without IV contrast. COMPARISON:   None. FINDINGS: Lower chest: Small amount of bilateral dependent atelectasis or scarring. Mildly enlarged heart. Minimal pericardial effusion with a maximum thickness of 6 mm. Atheromatous coronary artery calcifications. Hepatobiliary: Very large gallstone in the gallbladder measuring 5.5 cm in maximum diameter. No gallbladder wall thickening or pericholecystic fluid. Unremarkable liver. Pancreas: Unremarkable. No pancreatic ductal dilatation or surrounding inflammatory changes. Spleen: Normal in size without focal abnormality. Adrenals/Urinary Tract: Adrenal glands are unremarkable. Kidneys are normal, without renal calculi, focal lesion, or hydronephrosis. Left posterolateral bladder diverticulum. Stomach/Bowel: Small hiatal hernia. Mild colonic diverticulosis without evidence of diverticulitis. Normal appearing small bowel. No evidence of appendicitis. Vascular/Lymphatic: Atheromatous arterial calcifications without aneurysm. No enlarged lymph nodes. Reproductive: Multiple prostate radiation seed implants. Other: Small bilateral inguinal hernias containing fat. Small umbilical hernia containing fat. Musculoskeletal: Extensive lumbar and lower thoracic spine degenerative changes. IMPRESSION: 1. No acute abnormality. 2. Very large gallstone in the gallbladder without evidence of cholecystitis. 3. Small hiatal hernia. 4. Mild colonic diverticulosis. 5. Left bladder diverticulum. 6. Mild cardiomegaly and minimal pericardial effusion. 7.  Calcific atherosclerosis, including the coronary arteries. Electronically Signed: By: Beckie Salts M.D. On: 05/10/2018 15:38   Mr Abdomen Mrcp Wo Contrast  Result Date: 05/10/2018 CLINICAL DATA:  Abdominal pain with gallstones, biliary dilatation and possible choledocholithiasis on CT. Elevated liver function studies. EXAM: MRI ABDOMEN WITHOUT CONTRAST  (INCLUDING MRCP) TECHNIQUE: Multiplanar multisequence MR imaging of the abdomen was performed. Heavily T2-weighted images of the  biliary and pancreatic ducts were obtained, and three-dimensional MRCP images were rendered by post processing. COMPARISON:  CT and ultrasound earlier the same date. FINDINGS: Despite efforts by the technologist and patient, moderate motion artifact is present on today's exam and could not be eliminated. This reduces exam sensitivity and specificity. Lower chest: Mild dependent atelectasis at both lung bases. No significant pleural effusion. Hepatobiliary: No focal hepatic abnormalities or significant steatosis. As on earlier studies, there is a large gallstone with mild gallbladder wall thickening. Evaluation of the biliary system is limited by the common hepatic duct measures up to 12 mm in diameter. As correlated with the previous studies, there is persistent concern a small stone in the distal common bile duct, best seen on coronal image 24/4 and axial image 33/5. This is also seen on MRCP images 7 and 8 of series 6. Pancreas: Unremarkable. No pancreatic ductal dilatation or surrounding inflammatory changes. Spleen: Normal in size without focal abnormality. Adrenals/Urinary Tract: Both adrenal glands appear normal. Mild renal cortical thinning bilaterally and small bilateral renal cysts. No hydronephrosis. Stomach/Bowel: There are periampullary duodenal diverticula. No bowel wall thickening or significant distention demonstrated. Vascular/Lymphatic: There are no enlarged abdominal lymph nodes. Aortic and branch vessel atherosclerosis, better seen on CT. Other: Mild retroperitoneal edema without focal fluid collection or significant ascites. Musculoskeletal: No acute or significant osseous findings. Degenerative changes throughout the thoracolumbar spine. IMPRESSION: 1. Persistent concern of choledocholithiasis with associated extrahepatic biliary dilatation. Study is suboptimal due to breathing artifact. 2. Cholelithiasis with biliary dilatation, but no pericholecystic inflammation. Electronically Signed   By:  Carey BullocksWilliam  Veazey M.D.   On: 05/10/2018 19:41   Dg Chest Port 1 View  Result Date: 05/10/2018 CLINICAL DATA:  Fever.  Sepsis. EXAM: PORTABLE CHEST 1 VIEW COMPARISON:  None. FINDINGS: Borderline enlarged cardiac silhouette. Mildly elevated right hemidiaphragm. Small amount of bilateral pleural thickening or fluid. Minimal bibasilar linear density. Mildly prominent pulmonary vasculature. Thoracic spine degenerative changes. Bilateral shoulder degenerative changes with superior migration of the right humeral head. IMPRESSION: 1. Borderline cardiomegaly and mild pulmonary vascular congestion. 2. Small amount of bilateral pleural thickening or fluid. 3. Minimal bibasilar atelectasis. 4. Bilateral shoulder degenerative changes with evidence of a large, chronic rotator cuff tear on the right. Electronically Signed   By: Beckie SaltsSteven  Reid M.D.   On: 05/10/2018 14:23   [place] Abdomen Limited Ruq  Result Date: 05/10/2018 CLINICAL DATA:  Abdominal pain with fever and vomiting. Large gallstone on CT. EXAM: ULTRASOUND ABDOMEN LIMITED RIGHT UPPER QUADRANT COMPARISON:  CT earlier same date. FINDINGS: Gallbladder: In correlation with the CT done earlier today, there is a large shadowing gallstone in the gallbladder lumen. This measured up to 5.5 cm on CT and was partially calcified. Sludge is also present in the gallbladder lumen. There is mild gallbladder wall thickening, but no sonographic Murphy's sign. Common bile duct: Diameter: Moderately dilated, measuring up to 17 mm in diameter. No obstructing intraductal calculi are seen. However, in correlation with the earlier CT, there is suggestion of a distal common bile duct stone measuring up to 8 mm (coronal image 40/5 and sagittal image 63/6). Liver: No focal lesion identified. Within normal limits in parenchymal echogenicity. Portal vein is patent on color Doppler imaging with normal direction of blood flow towards the liver. IMPRESSION: 1. Large gallstone and gallbladder sludge with  gallbladder wall thickening, but no sonographic Murphy's sign. 2. Extrahepatic biliary dilatation with concern of choledocholithiasis based on earlier CT. No stones are identified by this ultrasound. Consider MRCP  for further evaluation. Electronically Signed   By: Carey Bullocks M.D.   On: 05/10/2018 18:18    Anti-infectives: Anti-infectives (From admission, onward)   Start     Dose/Rate Route Frequency Ordered Stop   05/10/18 2200  piperacillin-tazobactam (ZOSYN) IVPB 3.375 g     3.375 g 12.5 mL/hr over 240 Minutes Intravenous Every 8 hours 05/10/18 1850     05/10/18 1430  vancomycin (VANCOCIN) 1,500 mg in sodium chloride 0.9 % 500 mL IVPB     1,500 mg 250 mL/hr over 120 Minutes Intravenous  Once 05/10/18 1403 05/10/18 1735   05/10/18 1400  piperacillin-tazobactam (ZOSYN) IVPB 3.375 g     3.375 g 100 mL/hr over 30 Minutes Intravenous  Once 05/10/18 1350 05/10/18 1501   05/10/18 1400  vancomycin (VANCOCIN) IVPB 1000 mg/200 mL premix  Status:  Discontinued     1,000 mg 200 mL/hr over 60 Minutes Intravenous  Once 05/10/18 1350 05/10/18 1403      Assessment/Plan: Mr. Genter is a 84 yo with choledocolithiasis and some cholangitis and a large gallstone measuring 5.5 cm in the gallbladder. Improving with hydration and antibiotics. Diet as tolerated Dr. Karilyn Cota seeing, and will make recommendations regarding ERCP Troponin to 0.07 but stable, no true ACS symptoms, discussed with Dr. Sherryll Burger no real concern  Will need cholecystectomy in future but will be likely to need open procedure or at least a larger extraction site due to the size of the stone in the gallbladder, can discuss further with the patient and family once patient has duct cleared    LOS: 1 day    Lucretia Roers 05/11/2018

## 2018-05-11 NOTE — Progress Notes (Signed)
PROGRESS NOTE    Robert Cardenas  ZOX:[id number] DOB: August 06, 1926 DOA: 05/10/2018 PCP: Robert Perches, MD   Brief Narrative:   Robert Cardenas is a 84 y.o. male with a history of hypertension, gout, prostate cancer status post brachytherapy.  Patient brought to the emergency department by EMS due to being found with a fever, altered mental status, and an episode of vomiting.  Patient was seen passed out in his car.    He has had some intermittent left shoulder pain as well as right upper quadrant abdominal pain in the last several days.  He has been admitted with suspicion of a sending cholangitis secondary to choledocholithiasis as noted on imaging studies.   Assessment & Plan:   Principal Problem:   Sepsis (HCC) Active Problems:   HTN (hypertension)   Cholelithiasis   Elevated LFTs   Elevated troponin   1. Sepsis secondary to likely ascending cholangitis secondary to choledocholithiasis.  Appreciate GI and general surgery evaluation with possible need for ERCP and eventual cholecystectomy based on MRCP.  Continue on Zosyn with n.p.o. for now and IV fluid.  Monitor with repeat labs.  Continue n.p.o. for now. 2. Elevated troponins.  Likely secondary to demand ischemia from above.  Trend has remained flat with no suspicion of ACS. 3. Transaminitis.  Continue to trend due to above process.  Total bilirubin still elevated. 4. Mild hypokalemia.  Replete with IV and recheck a.m. labs with magnesium. 5. Hypertension.  Currently with soft blood pressure readings.  Continue to hold antihypertensives.   DVT prophylaxis: SCDs Code Status: Full Family Communication: None at bedside Disposition Plan: Per GI/GS   Consultants:   GI and GS  Procedures:   None  Antimicrobials:   Zosyn 6/7->   Subjective: Patient seen and evaluated today with no new acute complaints or concerns. No acute concerns or events noted overnight.  He continues to have some ongoing upper abdominal/chest pain and  left shoulder pain but states that this is improved.  No further nausea noted at this time.  Objective: Vitals:   05/10/18 1800 05/10/18 1925 05/10/18 2301 05/11/18 0552  BP: 97/61 (!) 99/57 112/60 (!) 101/59  Pulse: 83 67 64 71  Resp: (!) 22 20 20 18   Temp:  98.5 F (36.9 C) 97.6 F (36.4 C) 97.9 F (36.6 C)  TempSrc:  Oral Oral Oral  SpO2: 97% 97% 97% 96%  Weight:  79.4 kg (175 lb 0.7 oz)    Height:  5\' 9"  (1.753 m)      Intake/Output Summary (Last 24 hours) at 05/11/2018 1010 Last data filed at 05/11/2018 0730 Gross per 24 hour  Intake 3125 ml  Output 350 ml  Net 2775 ml   Filed Weights   05/10/18 1343 05/10/18 1925  Weight: 83.9 kg (185 lb) 79.4 kg (175 lb 0.7 oz)    Examination:  General exam: Appears calm and comfortable; hard of hearing Respiratory system: Clear to auscultation. Respiratory effort normal. Cardiovascular system: S1 & S2 heard, RRR. No JVD, murmurs, rubs, gallops or clicks. No pedal edema. Gastrointestinal system: Abdomen is nondistended, soft and nontender. No organomegaly or masses felt. Normal bowel sounds heard. Central nervous system: Alert and oriented. No focal neurological deficits. Extremities: Symmetric 5 x 5 power. Skin: No rashes, lesions or ulcers Psychiatry: Judgement and insight appear normal. Mood & affect appropriate.     Data Reviewed: I have personally reviewed following labs and imaging studies  CBC: Recent Labs  Lab 05/10/18 1408 05/11/18 8119  WBC 20.1* 22.8*  NEUTROABS 19.8*  --   HGB 12.6* 10.5*  HCT 38.6* 32.2*  MCV 101.3* 102.2*  PLT 267 212   Basic Metabolic Panel: Recent Labs  Lab 05/10/18 1408 05/11/18 0623  NA 137 140  K 3.6 3.4*  CL 100* 109  CO2 20* 25  GLUCOSE 230* 105*  BUN 21* 23*  CREATININE 1.74* 1.51*  CALCIUM 8.7* 7.8*   GFR: Estimated Creatinine Clearance: 33.2 mL/min (A) (by C-G formula based on SCr of 1.51 mg/dL (H)). Liver Function Tests: Recent Labs  Lab 05/10/18 1408  05/11/18 0623  AST 227* 103*  ALT 132* 82*  ALKPHOS 251* 140*  BILITOT 4.5* 4.1*  PROT 6.8 5.3*  ALBUMIN 3.5 2.7*   Recent Labs  Lab 05/10/18 1556  LIPASE 21   No results for input(s): AMMONIA in the last 168 hours. Coagulation Profile: No results for input(s): INR, PROTIME in the last 168 hours. Cardiac Enzymes: Recent Labs  Lab 05/10/18 1408 05/10/18 1756 05/10/18 2331 05/11/18 0623  TROPONINI 0.07* 0.08* 0.07* 0.06*   BNP (last 3 results) No results for input(s): PROBNP in the last 8760 hours. HbA1C: No results for input(s): HGBA1C in the last 72 hours. CBG: No results for input(s): GLUCAP in the last 168 hours. Lipid Profile: No results for input(s): CHOL, HDL, LDLCALC, TRIG, CHOLHDL, LDLDIRECT in the last 72 hours. Thyroid Function Tests: No results for input(s): TSH, T4TOTAL, FREET4, T3FREE, THYROIDAB in the last 72 hours. Anemia Panel: No results for input(s): VITAMINB12, FOLATE, FERRITIN, TIBC, IRON, RETICCTPCT in the last 72 hours. Sepsis Labs: Recent Labs  Lab 05/10/18 1408 05/10/18 1556 05/10/18 2332  LATICACIDVEN 7.1* 4.7* 1.3    Recent Results (from the past 240 hour(s))  Blood Culture (routine x 2)     Status: None (Preliminary result)   Collection Time: 05/10/18  2:08 PM  Result Value Ref Range Status   Specimen Description   Final    RIGHT ANTECUBITAL BOTTLES DRAWN AEROBIC AND ANAEROBIC   Special Requests   Final    4252 Blood Culture adequate volume Performed at [facility], [address], [place], [place] [address]    Culture PENDING  Incomplete   Report Status PENDING  Incomplete  Blood Culture (routine x 2)     Status: None (Preliminary result)   Collection Time: 05/10/18  2:10 PM  Result Value Ref Range Status   Specimen Description   Final    BLOOD LEFT HAND BOTTLES DRAWN AEROBIC AND ANAEROBIC   Special Requests   Final    1521 Blood Culture adequate volume Performed at [facility], [address], [place], [place] [address]     Culture PENDING  Incomplete   Report Status PENDING  Incomplete         Radiology Studies: Ct Abdomen Pelvis Wo Contrast  Addendum Date: 05/10/2018   ADDENDUM REPORT: 05/10/2018 18:29 ADDENDUM: On coronal image number 40, there is a 7 mm distal common duct stone. On sagittal image number 64, there is a possible 2nd much smaller distal common duct stone more distally with dilatation of the common duct. Electronically Signed   By: Beckie Salts M.D.   On: 05/10/2018 18:29   Result Date: 05/10/2018 CLINICAL DATA:  Fever.  Vomiting. EXAM: CT ABDOMEN AND PELVIS WITHOUT CONTRAST TECHNIQUE: Multidetector CT imaging of the abdomen and pelvis was performed following the standard protocol without IV contrast. COMPARISON:  None. FINDINGS: Lower chest: Small amount of bilateral dependent atelectasis or scarring. Mildly enlarged heart.  Minimal pericardial effusion with a maximum thickness of 6 mm. Atheromatous coronary artery calcifications. Hepatobiliary: Very large gallstone in the gallbladder measuring 5.5 cm in maximum diameter. No gallbladder wall thickening or pericholecystic fluid. Unremarkable liver. Pancreas: Unremarkable. No pancreatic ductal dilatation or surrounding inflammatory changes. Spleen: Normal in size without focal abnormality. Adrenals/Urinary Tract: Adrenal glands are unremarkable. Kidneys are normal, without renal calculi, focal lesion, or hydronephrosis. Left posterolateral bladder diverticulum. Stomach/Bowel: Small hiatal hernia. Mild colonic diverticulosis without evidence of diverticulitis. Normal appearing small bowel. No evidence of appendicitis. Vascular/Lymphatic: Atheromatous arterial calcifications without aneurysm. No enlarged lymph nodes. Reproductive: Multiple prostate radiation seed implants. Other: Small bilateral inguinal hernias containing fat. Small umbilical hernia containing fat. Musculoskeletal: Extensive lumbar and lower thoracic spine degenerative changes. IMPRESSION:  1. No acute abnormality. 2. Very large gallstone in the gallbladder without evidence of cholecystitis. 3. Small hiatal hernia. 4. Mild colonic diverticulosis. 5. Left bladder diverticulum. 6. Mild cardiomegaly and minimal pericardial effusion. 7.  Calcific atherosclerosis, including the coronary arteries. Electronically Signed: By: Beckie SaltsSteven  Reid M.D. On: 05/10/2018 15:38   Mr Abdomen Mrcp Wo Contrast  Result Date: 05/10/2018 CLINICAL DATA:  Abdominal pain with gallstones, biliary dilatation and possible choledocholithiasis on CT. Elevated liver function studies. EXAM: MRI ABDOMEN WITHOUT CONTRAST  (INCLUDING MRCP) TECHNIQUE: Multiplanar multisequence MR imaging of the abdomen was performed. Heavily T2-weighted images of the biliary and pancreatic ducts were obtained, and three-dimensional MRCP images were rendered by post processing. COMPARISON:  CT and ultrasound earlier the same date. FINDINGS: Despite efforts by the technologist and patient, moderate motion artifact is present on today's exam and could not be eliminated. This reduces exam sensitivity and specificity. Lower chest: Mild dependent atelectasis at both lung bases. No significant pleural effusion. Hepatobiliary: No focal hepatic abnormalities or significant steatosis. As on earlier studies, there is a large gallstone with mild gallbladder wall thickening. Evaluation of the biliary system is limited by the common hepatic duct measures up to 12 mm in diameter. As correlated with the previous studies, there is persistent concern a small stone in the distal common bile duct, best seen on coronal image 24/4 and axial image 33/5. This is also seen on MRCP images 7 and 8 of series 6. Pancreas: Unremarkable. No pancreatic ductal dilatation or surrounding inflammatory changes. Spleen: Normal in size without focal abnormality. Adrenals/Urinary Tract: Both adrenal glands appear normal. Mild renal cortical thinning bilaterally and small bilateral renal cysts. No  hydronephrosis. Stomach/Bowel: There are periampullary duodenal diverticula. No bowel wall thickening or significant distention demonstrated. Vascular/Lymphatic: There are no enlarged abdominal lymph nodes. Aortic and branch vessel atherosclerosis, better seen on CT. Other: Mild retroperitoneal edema without focal fluid collection or significant ascites. Musculoskeletal: No acute or significant osseous findings. Degenerative changes throughout the thoracolumbar spine. IMPRESSION: 1. Persistent concern of choledocholithiasis with associated extrahepatic biliary dilatation. Study is suboptimal due to breathing artifact. 2. Cholelithiasis with biliary dilatation, but no pericholecystic inflammation. Electronically Signed   By: Carey BullocksWilliam  Veazey M.D.   On: 05/10/2018 19:41   Dg Chest Port 1 View  Result Date: 05/10/2018 CLINICAL DATA:  Fever.  Sepsis. EXAM: PORTABLE CHEST 1 VIEW COMPARISON:  None. FINDINGS: Borderline enlarged cardiac silhouette. Mildly elevated right hemidiaphragm. Small amount of bilateral pleural thickening or fluid. Minimal bibasilar linear density. Mildly prominent pulmonary vasculature. Thoracic spine degenerative changes. Bilateral shoulder degenerative changes with superior migration of the right humeral head. IMPRESSION: 1. Borderline cardiomegaly and mild pulmonary vascular congestion. 2. Small amount of bilateral pleural thickening or fluid. 3.  Minimal bibasilar atelectasis. 4. Bilateral shoulder degenerative changes with evidence of a large, chronic rotator cuff tear on the right. Electronically Signed   By: Beckie Salts M.D.   On: 05/10/2018 14:23   US Abdomen Limited Ruq  Result Date: 05/10/2018 CLINICAL DATA:  Abdominal pain with fever and vomiting. Large gallstone on CT. EXAM: ULTRASOUND ABDOMEN LIMITED RIGHT UPPER QUADRANT COMPARISON:  CT earlier same date. FINDINGS: Gallbladder: In correlation with the CT done earlier today, there is a large shadowing gallstone in the gallbladder  lumen. This measured up to 5.5 cm on CT and was partially calcified. Sludge is also present in the gallbladder lumen. There is mild gallbladder wall thickening, but no sonographic Murphy's sign. Common bile duct: Diameter: Moderately dilated, measuring up to 17 mm in diameter. No obstructing intraductal calculi are seen. However, in correlation with the earlier CT, there is suggestion of a distal common bile duct stone measuring up to 8 mm (coronal image 40/5 and sagittal image 63/6). Liver: No focal lesion identified. Within normal limits in parenchymal echogenicity. Portal vein is patent on color Doppler imaging with normal direction of blood flow towards the liver. IMPRESSION: 1. Large gallstone and gallbladder sludge with gallbladder wall thickening, but no sonographic Murphy's sign. 2. Extrahepatic biliary dilatation with concern of choledocholithiasis based on earlier CT. No stones are identified by this ultrasound. Consider MRCP for further evaluation. Electronically Signed   By: Carey Bullocks M.D.   On: 05/10/2018 18:18        Scheduled Meds: . aspirin EC  81 mg Oral Daily  . heparin  5,000 Units Subcutaneous Once  . pneumococcal 23 valent vaccine  0.5 mL Intramuscular Tomorrow-1000   Continuous Infusions: . sodium chloride 125 mL/hr at 05/11/18 0420  . piperacillin-tazobactam (ZOSYN)  IV Stopped (05/11/18 0837)     LOS: 1 day    Time spent: 30 minutes    Adrick Kestler Hoover Brunette, DO [facility] Pager [phone]  If 7PM-7AM, please contact night-coverage [email or web address] Password [email or web address] 05/11/2018, 10:10 AM
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
## 2018-05-12 ENCOUNTER — Inpatient Hospital Stay (HOSPITAL_COMMUNITY): Payer: Medicare Other | Admitting: Anesthesiology

## 2018-05-12 ENCOUNTER — Encounter (HOSPITAL_COMMUNITY): Payer: Self-pay | Admitting: Anesthesiology

## 2018-05-12 ENCOUNTER — Other Ambulatory Visit: Payer: Self-pay

## 2018-05-12 ENCOUNTER — Encounter (HOSPITAL_COMMUNITY)
Admission: EM | Disposition: A | Discharge status: Home / Self Care | Payer: Self-pay | Source: Home / Self Care | Attending: Internal Medicine

## 2018-05-12 ENCOUNTER — Inpatient Hospital Stay (HOSPITAL_COMMUNITY): Payer: Medicare Other

## 2018-05-12 DIAGNOSIS — K8309 Other cholangitis: Secondary | ICD-10-CM

## 2018-05-12 DIAGNOSIS — K838 Other specified diseases of biliary tract: Secondary | ICD-10-CM

## 2018-05-12 DIAGNOSIS — K8030 Calculus of bile duct with cholangitis, unspecified, without obstruction: Secondary | ICD-10-CM

## 2018-05-12 HISTORY — PX: REMOVAL OF STONES: SHX5545

## 2018-05-12 HISTORY — PX: SPHINCTEROTOMY: SHX5544

## 2018-05-12 HISTORY — PX: ERCP: SHX5425

## 2018-05-12 HISTORY — PX: BALLOON DILATION: SHX5330

## 2018-05-12 LAB — BLOOD CULTURE ID PANEL (REFLEXED)
Acinetobacter baumannii: NOT DETECTED
CANDIDA KRUSEI: NOT DETECTED
CANDIDA PARAPSILOSIS: NOT DETECTED
CANDIDA TROPICALIS: NOT DETECTED
CARBAPENEM RESISTANCE: NOT DETECTED
Candida albicans: NOT DETECTED
Candida glabrata: NOT DETECTED
ENTEROBACTERIACEAE SPECIES: DETECTED — AB
Enterobacter cloacae complex: NOT DETECTED
Enterococcus species: NOT DETECTED
Escherichia coli: DETECTED — AB
HAEMOPHILUS INFLUENZAE: NOT DETECTED
KLEBSIELLA OXYTOCA: NOT DETECTED
KLEBSIELLA PNEUMONIAE: NOT DETECTED
Listeria monocytogenes: NOT DETECTED
Neisseria meningitidis: NOT DETECTED
PROTEUS SPECIES: NOT DETECTED
PSEUDOMONAS AERUGINOSA: NOT DETECTED
SERRATIA MARCESCENS: NOT DETECTED
STAPHYLOCOCCUS AUREUS BCID: NOT DETECTED
STREPTOCOCCUS PYOGENES: NOT DETECTED
STREPTOCOCCUS SPECIES: NOT DETECTED
Staphylococcus species: NOT DETECTED
Streptococcus agalactiae: NOT DETECTED
Streptococcus pneumoniae: NOT DETECTED

## 2018-05-12 LAB — COMPREHENSIVE METABOLIC PANEL
ALBUMIN: 2.9 g/dL — AB (ref 3.5–5.0)
ALK PHOS: 142 U/L — AB (ref 38–126)
ALT: 69 U/L — AB (ref 17–63)
AST: 65 U/L — AB (ref 15–41)
Anion gap: 9 (ref 5–15)
BUN: 20 mg/dL (ref 6–20)
CALCIUM: 8.3 mg/dL — AB (ref 8.9–10.3)
CO2: 23 mmol/L (ref 22–32)
CREATININE: 1.53 mg/dL — AB (ref 0.61–1.24)
Chloride: 108 mmol/L (ref 101–111)
GFR calc Af Amer: 45 mL/min — ABNORMAL LOW (ref >60)
GFR calc non Af Amer: 39 mL/min — ABNORMAL LOW (ref >60)
GLUCOSE: 128 mg/dL — AB (ref 65–99)
Potassium: 3.6 mmol/L (ref 3.5–5.1)
Sodium: 140 mmol/L (ref 135–145)
Total Bilirubin: 3.4 mg/dL — ABNORMAL HIGH (ref 0.3–1.2)
Total Protein: 5.8 g/dL — ABNORMAL LOW (ref 6.5–8.1)

## 2018-05-12 LAB — PROTIME-INR
INR: 1.18
Prothrombin Time: 14.9 seconds (ref 11.4–15.2)

## 2018-05-12 LAB — CBC
HCT: 34.7 % — ABNORMAL LOW (ref 39.0–52.0)
HEMOGLOBIN: 11.2 g/dL — AB (ref 13.0–17.0)
MCH: 32.7 pg (ref 26.0–34.0)
MCHC: 32.3 g/dL (ref 30.0–36.0)
MCV: 101.5 fL — ABNORMAL HIGH (ref 78.0–100.0)
PLATELETS: 226 10*3/uL (ref 150–400)
RBC: 3.42 MIL/uL — ABNORMAL LOW (ref 4.22–5.81)
RDW: 14.4 % (ref 11.5–15.5)
WBC: 15.4 10*3/uL — ABNORMAL HIGH (ref 4.0–10.5)

## 2018-05-12 LAB — MAGNESIUM: Magnesium: 1.9 mg/dL (ref 1.7–2.4)

## 2018-05-12 SURGERY — ERCP, WITH INTERVENTION IF INDICATED
Anesthesia: General

## 2018-05-12 MED ORDER — GLUCAGON HCL RDNA (DIAGNOSTIC) 1 MG IJ SOLR
0.3000 mg | INTRAMUSCULAR | Status: AC
  Administered 2018-05-12 (×2): 0.25 mg via INTRAVENOUS

## 2018-05-12 MED ORDER — STERILE WATER FOR IRRIGATION IR SOLN
Status: DC | PRN
  Administered 2018-05-12: 1 mL

## 2018-05-12 MED ORDER — IOPAMIDOL (ISOVUE-M 300) INJECTION 61%
INTRAMUSCULAR | Status: DC | PRN
  Administered 2018-05-12: 10 mL via INTRATHECAL

## 2018-05-12 MED ORDER — HYDROMORPHONE HCL 1 MG/ML IJ SOLN: 0.2500 mg | INTRAMUSCULAR | Status: DC | PRN

## 2018-05-12 MED ORDER — PROPOFOL 10 MG/ML IV BOLUS
INTRAVENOUS | Status: DC | PRN
  Administered 2018-05-12: 150 mg via INTRAVENOUS
  Administered 2018-05-12: 50 mg via INTRAVENOUS

## 2018-05-12 MED ORDER — LIDOCAINE HCL (CARDIAC) PF 100 MG/5ML IV SOSY
PREFILLED_SYRINGE | INTRAVENOUS | Status: DC | PRN
  Administered 2018-05-12: 50 mg via INTRAVENOUS

## 2018-05-12 MED ORDER — LACTATED RINGERS IV SOLN
INTRAVENOUS | Status: DC | PRN
  Administered 2018-05-12: 08:00:00 via INTRAVENOUS

## 2018-05-12 MED ORDER — SODIUM CHLORIDE 0.9 % IV SOLN
2.0000 g | INTRAVENOUS | Status: AC
  Administered 2018-05-13: 2 g via INTRAVENOUS
  Filled 2018-05-12 (×2): qty 2

## 2018-05-12 MED ORDER — PHENOL 1.4 % MT LIQD
1.0000 | OROMUCOSAL | Status: DC | PRN
  Administered 2018-05-13: 1 via OROMUCOSAL
  Filled 2018-05-12: qty 177

## 2018-05-12 MED ORDER — LACTATED RINGERS IV SOLN: INTRAVENOUS | Status: DC

## 2018-05-12 MED ORDER — SUCCINYLCHOLINE CHLORIDE 20 MG/ML IJ SOLN
INTRAMUSCULAR | Status: DC | PRN
  Administered 2018-05-12: 100 mg via INTRAVENOUS

## 2018-05-12 MED ORDER — SODIUM CHLORIDE 0.9 % IV SOLN: INTRAVENOUS | Status: DC

## 2018-05-12 MED ORDER — ATENOLOL 25 MG PO TABS
25.0000 mg | ORAL_TABLET | Freq: Every day | ORAL | Status: DC
  Administered 2018-05-12: 25 mg via ORAL
  Filled 2018-05-12: qty 1

## 2018-05-12 MED ORDER — MEPERIDINE HCL 50 MG/ML IJ SOLN: 6.2500 mg | INTRAMUSCULAR | Status: DC | PRN

## 2018-05-12 MED ORDER — LIDOCAINE HCL (PF) 1 % IJ SOLN
INTRAMUSCULAR | Status: AC
  Filled 2018-05-12: qty 5

## 2018-05-12 MED ORDER — SUCCINYLCHOLINE CHLORIDE 20 MG/ML IJ SOLN
INTRAMUSCULAR | Status: AC
  Filled 2018-05-12: qty 1

## 2018-05-12 MED ORDER — ONDANSETRON HCL 4 MG/2ML IJ SOLN: 4.0000 mg | Freq: Once | INTRAMUSCULAR | Status: DC | PRN

## 2018-05-12 MED ORDER — CHLORHEXIDINE GLUCONATE CLOTH 2 % EX PADS
6.0000 | MEDICATED_PAD | Freq: Once | CUTANEOUS | Status: AC
  Administered 2018-05-12: 6 via TOPICAL

## 2018-05-12 MED ORDER — AMLODIPINE BESYLATE 5 MG PO TABS
5.0000 mg | ORAL_TABLET | Freq: Every day | ORAL | Status: DC
  Administered 2018-05-12 – 2018-05-16 (×5): 5 mg via ORAL
  Filled 2018-05-12 (×5): qty 1

## 2018-05-12 MED ORDER — PHENYLEPHRINE 40 MCG/ML (10ML) SYRINGE FOR IV PUSH (FOR BLOOD PRESSURE SUPPORT)
PREFILLED_SYRINGE | INTRAVENOUS | Status: AC
  Filled 2018-05-12: qty 10

## 2018-05-12 MED ORDER — GLUCAGON HCL RDNA (DIAGNOSTIC) 1 MG IJ SOLR
INTRAMUSCULAR | Status: AC
  Filled 2018-05-12: qty 1

## 2018-05-12 MED ORDER — PROPOFOL 10 MG/ML IV BOLUS
INTRAVENOUS | Status: AC
  Filled 2018-05-12: qty 20

## 2018-05-12 MED ORDER — GLUCAGON HCL RDNA (DIAGNOSTIC) 1 MG IJ SOLR
INTRAMUSCULAR | Status: AC
  Filled 2018-05-12: qty 2

## 2018-05-12 MED ORDER — HYDROCODONE-ACETAMINOPHEN 7.5-325 MG PO TABS: 1.0000 | ORAL_TABLET | Freq: Once | ORAL | Status: DC | PRN

## 2018-05-12 MED ORDER — CHLORHEXIDINE GLUCONATE CLOTH 2 % EX PADS: 6.0000 | MEDICATED_PAD | Freq: Once | CUTANEOUS | Status: AC

## 2018-05-12 NOTE — Anesthesia Procedure Notes (Signed)
Procedure Name: Intubation Date/Time: 05/12/2018 8:22 AM Performed by: Nicanor Alcon, MD Pre-anesthesia Checklist: Patient identified, Patient being monitored, Timeout performed, Emergency Drugs available and Suction available Patient Re-evaluated:Patient Re-evaluated prior to induction Oxygen Delivery Method: Circle System Utilized Preoxygenation: Pre-oxygenation with 100% oxygen Induction Type: IV induction Ventilation: Mask ventilation without difficulty Laryngoscope Size: Mac and 3 Grade View: Grade I Tube type: Oral Tube size: 7.0 mm Number of attempts: 1 Airway Equipment and Method: Stylet Placement Confirmation: ETT inserted through vocal cords under direct vision,  positive ETCO2 and breath sounds checked- equal and bilateral Secured at: 21 cm Tube secured with: Tape Dental Injury: Teeth and Oropharynx as per pre-operative assessment

## 2018-05-12 NOTE — Progress Notes (Signed)
[facility] Progress Note  Day of Surgery  Subjective: Looking great this AM after ERCP. Feeling good. No complaints. Wants to go to the restroom.   Son in room, and there while we discussed laparoscopic cholecystectomy tomorrow.   Objective: Vital signs in last 24 hours: Temp:  [97.2 F (36.2 C)-99.7 F (37.6 C)] 98.4 F (36.9 C) (06/09 1018) Pulse Rate:  [49-79] 63 (06/09 1018) Resp:  [16-23] 18 (06/09 1018) BP: (107-146)/(58-96) 144/96 (06/09 1018) SpO2:  [95 %-99 %] 98 % (06/09 1018)    Intake/Output from previous day: 06/08 0701 - 06/09 0700 In: 2969.6 [P.O.:480; I.V.:2289.6; IV Piggyback:200] Out: 1000 [Urine:1000] Intake/Output this shift: Total I/O In: 600 [I.V.:600] Out: 350 [Urine:350]  General appearance: alert, cooperative and no distress Resp: normal work breathing GI: soft, nondistended, minimallhy tender RUQ  Lab Results:  Recent Labs    05/11/18 0623 05/12/18 0631  WBC 22.8* 15.4*  HGB 10.5* 11.2*  HCT 32.2* 34.7*  PLT 212 226   BMET Recent Labs    05/11/18 0623 05/12/18 0631  NA 140 140  K 3.4* 3.6  CL 109 108  CO2 25 23  GLUCOSE 105* 128*  BUN 23* 20  CREATININE 1.51* 1.53*  CALCIUM 7.8* 8.3*   PT/INR Recent Labs    05/12/18 0631  LABPROT 14.9  INR 1.18    Studies/Results: Ct Abdomen Pelvis Wo Contrast  Addendum Date: 05/10/2018   ADDENDUM REPORT: 05/10/2018 18:29 ADDENDUM: On coronal image number 40, there is a 7 mm distal common duct stone. On sagittal image number 64, there is a possible 2nd much smaller distal common duct stone more distally with dilatation of the common duct. Electronically Signed   By: Beckie Salts M.D.   On: 05/10/2018 18:29   Result Date: 05/10/2018 CLINICAL DATA:  Fever.  Vomiting. EXAM: CT ABDOMEN AND PELVIS WITHOUT CONTRAST TECHNIQUE: Multidetector CT imaging of the abdomen and pelvis was performed following the standard protocol without IV contrast. COMPARISON:  None. FINDINGS: Lower  chest: Small amount of bilateral dependent atelectasis or scarring. Mildly enlarged heart. Minimal pericardial effusion with a maximum thickness of 6 mm. Atheromatous coronary artery calcifications. Hepatobiliary: Very large gallstone in the gallbladder measuring 5.5 cm in maximum diameter. No gallbladder wall thickening or pericholecystic fluid. Unremarkable liver. Pancreas: Unremarkable. No pancreatic ductal dilatation or surrounding inflammatory changes. Spleen: Normal in size without focal abnormality. Adrenals/Urinary Tract: Adrenal glands are unremarkable. Kidneys are normal, without renal calculi, focal lesion, or hydronephrosis. Left posterolateral bladder diverticulum. Stomach/Bowel: Small hiatal hernia. Mild colonic diverticulosis without evidence of diverticulitis. Normal appearing small bowel. No evidence of appendicitis. Vascular/Lymphatic: Atheromatous arterial calcifications without aneurysm. No enlarged lymph nodes. Reproductive: Multiple prostate radiation seed implants. Other: Small bilateral inguinal hernias containing fat. Small umbilical hernia containing fat. Musculoskeletal: Extensive lumbar and lower thoracic spine degenerative changes. IMPRESSION: 1. No acute abnormality. 2. Very large gallstone in the gallbladder without evidence of cholecystitis. 3. Small hiatal hernia. 4. Mild colonic diverticulosis. 5. Left bladder diverticulum. 6. Mild cardiomegaly and minimal pericardial effusion. 7.  Calcific atherosclerosis, including the coronary arteries. Electronically Signed: By: Beckie Salts M.D. On: 05/10/2018 15:38   Mr Abdomen Mrcp Wo Contrast  Result Date: 05/10/2018 CLINICAL DATA:  Abdominal pain with gallstones, biliary dilatation and possible choledocholithiasis on CT. Elevated liver function studies. EXAM: MRI ABDOMEN WITHOUT CONTRAST  (INCLUDING MRCP) TECHNIQUE: Multiplanar multisequence MR imaging of the abdomen was performed. Heavily T2-weighted images of the biliary and pancreatic  ducts were obtained, and three-dimensional MRCP  images were rendered by post processing. COMPARISON:  CT and ultrasound earlier the same date. FINDINGS: Despite efforts by the technologist and patient, moderate motion artifact is present on today's exam and could not be eliminated. This reduces exam sensitivity and specificity. Lower chest: Mild dependent atelectasis at both lung bases. No significant pleural effusion. Hepatobiliary: No focal hepatic abnormalities or significant steatosis. As on earlier studies, there is a large gallstone with mild gallbladder wall thickening. Evaluation of the biliary system is limited by the common hepatic duct measures up to 12 mm in diameter. As correlated with the previous studies, there is persistent concern a small stone in the distal common bile duct, best seen on coronal image 24/4 and axial image 33/5. This is also seen on MRCP images 7 and 8 of series 6. Pancreas: Unremarkable. No pancreatic ductal dilatation or surrounding inflammatory changes. Spleen: Normal in size without focal abnormality. Adrenals/Urinary Tract: Both adrenal glands appear normal. Mild renal cortical thinning bilaterally and small bilateral renal cysts. No hydronephrosis. Stomach/Bowel: There are periampullary duodenal diverticula. No bowel wall thickening or significant distention demonstrated. Vascular/Lymphatic: There are no enlarged abdominal lymph nodes. Aortic and branch vessel atherosclerosis, better seen on CT. Other: Mild retroperitoneal edema without focal fluid collection or significant ascites. Musculoskeletal: No acute or significant osseous findings. Degenerative changes throughout the thoracolumbar spine. IMPRESSION: 1. Persistent concern of choledocholithiasis with associated extrahepatic biliary dilatation. Study is suboptimal due to breathing artifact. 2. Cholelithiasis with biliary dilatation, but no pericholecystic inflammation. Electronically Signed   By: Carey BullocksWilliam  Veazey M.D.    On: 05/10/2018 19:41   Dg Chest Port 1 View  Result Date: 05/10/2018 CLINICAL DATA:  Fever.  Sepsis. EXAM: PORTABLE CHEST 1 VIEW COMPARISON:  None. FINDINGS: Borderline enlarged cardiac silhouette. Mildly elevated right hemidiaphragm. Small amount of bilateral pleural thickening or fluid. Minimal bibasilar linear density. Mildly prominent pulmonary vasculature. Thoracic spine degenerative changes. Bilateral shoulder degenerative changes with superior migration of the right humeral head. IMPRESSION: 1. Borderline cardiomegaly and mild pulmonary vascular congestion. 2. Small amount of bilateral pleural thickening or fluid. 3. Minimal bibasilar atelectasis. 4. Bilateral shoulder degenerative changes with evidence of a large, chronic rotator cuff tear on the right. Electronically Signed   By: Beckie SaltsSteven  Reid M.D.   On: 05/10/2018 14:23   [place] Abdomen Limited Ruq  Result Date: 05/10/2018 CLINICAL DATA:  Abdominal pain with fever and vomiting. Large gallstone on CT. EXAM: ULTRASOUND ABDOMEN LIMITED RIGHT UPPER QUADRANT COMPARISON:  CT earlier same date. FINDINGS: Gallbladder: In correlation with the CT done earlier today, there is a large shadowing gallstone in the gallbladder lumen. This measured up to 5.5 cm on CT and was partially calcified. Sludge is also present in the gallbladder lumen. There is mild gallbladder wall thickening, but no sonographic Murphy's sign. Common bile duct: Diameter: Moderately dilated, measuring up to 17 mm in diameter. No obstructing intraductal calculi are seen. However, in correlation with the earlier CT, there is suggestion of a distal common bile duct stone measuring up to 8 mm (coronal image 40/5 and sagittal image 63/6). Liver: No focal lesion identified. Within normal limits in parenchymal echogenicity. Portal vein is patent on color Doppler imaging with normal direction of blood flow towards the liver. IMPRESSION: 1. Large gallstone and gallbladder sludge with gallbladder wall  thickening, but no sonographic Murphy's sign. 2. Extrahepatic biliary dilatation with concern of choledocholithiasis based on earlier CT. No stones are identified by this ultrasound. Consider MRCP for further evaluation. Electronically Signed   By: Chrissie NoaWilliam  Purcell Mouton M.D.   On: 05/10/2018 18:18    Anti-infectives: Anti-infectives (From admission, onward)   Start     Dose/Rate Route Frequency Ordered Stop   05/10/18 2200  piperacillin-tazobactam (ZOSYN) IVPB 3.375 g     3.375 g 12.5 mL/hr over 240 Minutes Intravenous Every 8 hours 05/10/18 1850     05/10/18 1430  vancomycin (VANCOCIN) 1,500 mg in sodium chloride 0.9 % 500 mL IVPB     1,500 mg 250 mL/hr over 120 Minutes Intravenous  Once 05/10/18 1403 05/10/18 1735   05/10/18 1400  piperacillin-tazobactam (ZOSYN) IVPB 3.375 g     3.375 g 100 mL/hr over 30 Minutes Intravenous  Once 05/10/18 1350 05/10/18 1501   05/10/18 1400  vancomycin (VANCOCIN) IVPB 1000 mg/200 mL premix  Status:  Discontinued     1,000 mg 200 mL/hr over 60 Minutes Intravenous  Once 05/10/18 1350 05/10/18 1403      Assessment/Plan: Mr. Gatta is a 84 yo with choledocolithiasis and some cholangitis and a large gallstone measuring 5.5 cm in the gallbladder s/p ERCP today. Tolerated anesthesia well. No complaints. Does have a GNR In the blood but has been hemodynamically stable.  Discussed with Dr. Karilyn Cota and Dr. Sherryll Burger. Patient doing well. As long as no hemodynamic issues/ shock issues after ERCP in the next 24 hrs, will plan for laparoscopic cholecystectomy tomorrow.   Diet as tolerated until Midnight. Continue IV antibiotics for cholangitis/ bacteremia   In addition to the below, discussed with the son and patient that the large stone may be able to be crushed/ and can still be removed laparoscopically versus doing the surgery laparoscopically and making a large extraction site.  Will proceed laparoscopically and convert if needed.   PLAN: I counseled the patient about the  indication, risks and benefits of laparoscopic cholecystectomy.  He understands there is a very small chance for bleeding, infection, injury to normal structures (including common bile duct), conversion to open surgery, persistent symptoms, evolution of postcholecystectomy diarrhea, need for secondary interventions, anesthesia reaction, cardiopulmonary issues and other risks not specifically detailed here. I described the expected recovery, the plan for follow-up and the restrictions during the recovery phase.  All questions were answered.   LOS: 2 days    Lucretia Roers 05/12/2018

## 2018-05-12 NOTE — Anesthesia Postprocedure Evaluation (Signed)
Anesthesia Post Note  Patient: Robert Cardenas  Procedure(s) Performed: ENDOSCOPIC RETROGRADE CHOLANGIOPANCREATOGRAPHY (ERCP) (N/A ) REMOVAL OF STONES  Patient location during evaluation: PACU Anesthesia Type: General Level of consciousness: awake and alert and patient cooperative Pain management: satisfactory to patient Vital Signs Assessment: post-procedure vital signs reviewed and stable Respiratory status: spontaneous breathing Cardiovascular status: stable Postop Assessment: no apparent nausea or vomiting Anesthetic complications: no     Last Vitals:  Vitals:   05/12/18 0607 05/12/18 0752  BP: (!) 144/74 (!) 143/82  Pulse: 70 79  Resp: 18 (!) 23  Temp: 37.3 C (!) 36.2 C  SpO2: 96% 99%    Last Pain:  Vitals:   05/12/18 0752  TempSrc: Oral  PainSc: 4                  Zya Finkle

## 2018-05-12 NOTE — Addendum Note (Signed)
Addendum  created 05/12/18 1013 by Allena EaringWilliams, Kalyn Hofstra C, MD   Charge Capture section accepted, Child order released for a procedure order, Intraprocedure Blocks edited, Order Canceled from Note

## 2018-05-12 NOTE — Transfer of Care (Signed)
Immediate Anesthesia Transfer of Care Note  Patient: Robert Cardenas  Procedure(s) Performed: ENDOSCOPIC RETROGRADE CHOLANGIOPANCREATOGRAPHY (ERCP) (N/A ) REMOVAL OF STONES  Patient Location: PACU  Anesthesia Type:General  Level of Consciousness: awake  Airway & Oxygen Therapy: Patient Spontanous Breathing and Patient connected to nasal cannula oxygen  Post-op Assessment: Report given to RN and Post -op Vital signs reviewed and stable  Post vital signs: Reviewed and stable  Last Vitals:  Vitals Value Taken Time  BP    Temp    Pulse    Resp    SpO2      Last Pain:  Vitals:   05/12/18 0752  TempSrc: Oral  PainSc: 4       Patients Stated Pain Goal: 5 (05/12/18 0752)  Complications: No apparent anesthesia complications

## 2018-05-12 NOTE — Anesthesia Preprocedure Evaluation (Addendum)
Anesthesia Evaluation  Patient identified by MRN, date of birth, ID band Patient awake    Reviewed: Allergy & Precautions, H&P , NPO status , Patient's Chart, lab work & pertinent test results, reviewed documented beta blocker date and time   Airway Mallampati: II  TM Distance: >3 FB Neck ROM: full    Dental no notable dental hx.    Pulmonary neg pulmonary ROS,    Pulmonary exam normal breath sounds clear to auscultation       Cardiovascular Exercise Tolerance: Good hypertension, negative cardio ROS   Rhythm:regular Rate:Normal     Neuro/Psych negative neurological ROS  negative psych ROS   GI/Hepatic negative GI ROS, Neg liver ROS,   Endo/Other  negative endocrine ROS  Renal/GU Renal diseasenegative Renal ROS  negative genitourinary   Musculoskeletal   Abdominal   Peds  Hematology negative hematology ROS (+)   Anesthesia Other Findings   Reproductive/Obstetrics negative OB ROS                             Anesthesia Physical Anesthesia Plan  ASA: III and emergent  Anesthesia Plan: General   Post-op Pain Management:    Induction:   PONV Risk Score and Plan:   Airway Management Planned:   Additional Equipment:   Intra-op Plan:   Post-operative Plan:   Informed Consent: I have reviewed the patients History and Physical, chart, labs and discussed the procedure including the risks, benefits and alternatives for the proposed anesthesia with the patient or authorized representative who has indicated his/her understanding and acceptance.   Dental Advisory Given  Plan Discussed with: CRNA  Anesthesia Plan Comments:        Anesthesia Quick Evaluation

## 2018-05-12 NOTE — Anesthesia Postprocedure Evaluation (Signed)
Anesthesia Post Note  Patient: Robert Cardenas  Procedure(s) Performed: ENDOSCOPIC RETROGRADE CHOLANGIOPANCREATOGRAPHY (ERCP) (N/A ) REMOVAL OF STONES  Patient location during evaluation: PACU Anesthesia Type: General Level of consciousness: awake and alert Pain management: pain level controlled Vital Signs Assessment: post-procedure vital signs reviewed and stable Respiratory status: spontaneous breathing, nonlabored ventilation, respiratory function stable and patient connected to nasal cannula oxygen Cardiovascular status: blood pressure returned to baseline and stable Postop Assessment: no apparent nausea or vomiting Anesthetic complications: no     Last Vitals:  Vitals:   05/12/18 0607 05/12/18 0752  BP: (!) 144/74 (!) 143/82  Pulse: 70 79  Resp: 18 (!) 23  Temp: 37.3 C (!) 36.2 C  SpO2: 96% 99%    Last Pain:  Vitals:   05/12/18 0752  TempSrc: Oral  PainSc: 4                  Shona NeedlesVander M Melane Windholz

## 2018-05-12 NOTE — Addendum Note (Signed)
Addendum  created 05/12/18 1021 by Allena EaringWilliams, Lorry Anastasi C, MD   Attestation recorded in Intraprocedure, Intraprocedure Attestations filed, Optician, dispensingntraprocedure Staff edited

## 2018-05-12 NOTE — Progress Notes (Addendum)
PROGRESS NOTE    Robert Cardenas  WUJ:[id number] DOB: June 15, 1926 DOA: 05/10/2018 PCP: Carylon Perches, MD   Brief Narrative:   Robert Cardenas is a 84 y.o. male with a history of hypertension, gout, prostate cancer status post brachytherapy.  Patient brought to the emergency department by EMS due to being found with a fever, altered mental status, and an episode of vomiting.  Patient was seen passed out in his car.    He has had some intermittent left shoulder pain as well as right upper quadrant abdominal pain in the last several days.  He has been admitted with suspicion of a sending cholangitis secondary to choledocholithiasis as noted on imaging studies.   Assessment & Plan:   Principal Problem:   Sepsis (HCC) Active Problems:   HTN (hypertension)   Cholelithiasis   Elevated LFTs   Elevated troponin   Choledocholithiasis   1. Sepsis secondary to likely ascending cholangitis with choledocholithiasis.  Appreciate GI and general surgery evaluation.  Patient is now status post ERCP this morning and has tolerated the procedure well.  Recommendations for no further antiplatelet agents or blood thinners at this time or NSAIDs for the next 3 days.  Will start clear liquid diet with n.p.o. after midnight due to plans for laparoscopic cholecystectomy in am as discussed with Dr. Henreitta Leber. 2. E.Coli bacteremia secondary to above. Continue IV Zosyn and await sensitivities. Clinically improving. 3. Elevated troponins.  Likely secondary to demand ischemia from above.  Trend has remained flat with no suspicion of ACS. No chest pain. 4. Transaminitis.  Continue to trend due to above process.  Total bilirubin is downtrending. 5. Mild hypokalemia-resolved.  Monitor on repeat labwork. 6. Hypertension. Starting to become elevated and will restart medications.   DVT prophylaxis: SCDs Code Status: Full Family Communication: None at bedside Disposition Plan: Per GI/GS   Consultants:   GI and  GS  Procedures:   None  Antimicrobials:   Zosyn 6/7->   Subjective: Patient seen and evaluated today with no new acute complaints or concerns.  He appears to be feeling well and has tolerated his diet quite well.  He has undergone ERCP with 2 stones extracted.  No acute events noted overnight.  Objective: Vitals:   05/12/18 0917 05/12/18 0930 05/12/18 0945 05/12/18 1018  BP: (!) 142/80 (!) 146/79 (!) 142/78 (!) 144/96  Pulse: 78 76  63  Resp: 18 18 16 18   Temp: 98.2 F (36.8 C)   98.4 F (36.9 C)  TempSrc:    Oral  SpO2: 99% 99% 99% 98%  Weight:      Height:        Intake/Output Summary (Last 24 hours) at 05/12/2018 1125 Last data filed at 05/12/2018 0921 Gross per 24 hour  Intake 3569.58 ml  Output 1150 ml  Net 2419.58 ml   Filed Weights   05/10/18 1343 05/10/18 1925  Weight: 83.9 kg (185 lb) 79.4 kg (175 lb 0.7 oz)    Examination:  General exam: Appears calm and comfortable; hard of hearing Respiratory system: Clear to auscultation. Respiratory effort normal. Cardiovascular system: S1 & S2 heard, RRR. No JVD, murmurs, rubs, gallops or clicks. No pedal edema. Gastrointestinal system: Abdomen is nondistended, soft and nontender. No organomegaly or masses felt. Normal bowel sounds heard. Central nervous system: Alert and oriented. No focal neurological deficits. Extremities: Symmetric 5 x 5 power. Skin: No rashes, lesions or ulcers Psychiatry: Judgement and insight appear normal. Mood & affect appropriate.     Data Reviewed:  I have personally reviewed following labs and imaging studies  CBC: Recent Labs  Lab 05/10/18 1408 05/11/18 0623 05/12/18 0631  WBC 20.1* 22.8* 15.4*  NEUTROABS 19.8*  --   --   HGB 12.6* 10.5* 11.2*  HCT 38.6* 32.2* 34.7*  MCV 101.3* 102.2* 101.5*  PLT 267 212 226   Basic Metabolic Panel: Recent Labs  Lab 05/10/18 1408 05/11/18 0623 05/12/18 0631  NA 137 140 140  K 3.6 3.4* 3.6  CL 100* 109 108  CO2 20* 25 23  GLUCOSE  230* 105* 128*  BUN 21* 23* 20  CREATININE 1.74* 1.51* 1.53*  CALCIUM 8.7* 7.8* 8.3*  MG  --   --  1.9   GFR: Estimated Creatinine Clearance: 32.7 mL/min (A) (by C-G formula based on SCr of 1.53 mg/dL (H)). Liver Function Tests: Recent Labs  Lab 05/10/18 1408 05/11/18 0623 05/12/18 0631  AST 227* 103* 65*  ALT 132* 82* 69*  ALKPHOS 251* 140* 142*  BILITOT 4.5* 4.1* 3.4*  PROT 6.8 5.3* 5.8*  ALBUMIN 3.5 2.7* 2.9*   Recent Labs  Lab 05/10/18 1556  LIPASE 21   No results for input(s): AMMONIA in the last 168 hours. Coagulation Profile: Recent Labs  Lab 05/12/18 0631  INR 1.18   Cardiac Enzymes: Recent Labs  Lab 05/10/18 1408 05/10/18 1756 05/10/18 2331 05/11/18 0623  TROPONINI 0.07* 0.08* 0.07* 0.06*   BNP (last 3 results) No results for input(s): PROBNP in the last 8760 hours. HbA1C: No results for input(s): HGBA1C in the last 72 hours. CBG: No results for input(s): GLUCAP in the last 168 hours. Lipid Profile: No results for input(s): CHOL, HDL, LDLCALC, TRIG, CHOLHDL, LDLDIRECT in the last 72 hours. Thyroid Function Tests: No results for input(s): TSH, T4TOTAL, FREET4, T3FREE, THYROIDAB in the last 72 hours. Anemia Panel: No results for input(s): VITAMINB12, FOLATE, FERRITIN, TIBC, IRON, RETICCTPCT in the last 72 hours. Sepsis Labs: Recent Labs  Lab 05/10/18 1408 05/10/18 1556 05/10/18 2332  LATICACIDVEN 7.1* 4.7* 1.3    Recent Results (from the past 240 hour(s))  Blood Culture (routine x 2)     Status: None (Preliminary result)   Collection Time: 05/10/18  2:08 PM  Result Value Ref Range Status   Specimen Description   Final    RIGHT ANTECUBITAL BOTTLES DRAWN AEROBIC AND ANAEROBIC   Special Requests [id number] Blood Culture adequate volume  Final   Culture  Setup Time   Final    GRAM NEGATIVE RODS Gram Stain Report Called to,Read Back By and Verified With: DUNN @ 0706 ON [id number] BY HENDERSON L. Performed at [facility], [address],  [place], [place] [address]    Culture PENDING  Incomplete   Report Status PENDING  Incomplete  Blood Culture (routine x 2)     Status: None (Preliminary result)   Collection Time: 05/10/18  2:10 PM  Result Value Ref Range Status   Specimen Description   Final    BLOOD LEFT HAND BOTTLES DRAWN AEROBIC AND ANAEROBIC   Special Requests 1521 Blood Culture adequate volume  Final   Culture   Final    NO GROWTH 1 DAY Performed at [facility], [address], [place], [place] [address]    Report Status PENDING  Incomplete         Radiology Studies: Ct Abdomen Pelvis Wo Contrast  Addendum Date: 05/10/2018   ADDENDUM REPORT: 05/10/2018 18:29 ADDENDUM: On coronal image number 40, there is a 7 mm distal common duct stone. On sagittal  image number 64, there is a possible 2nd much smaller distal common duct stone more distally with dilatation of the common duct. Electronically Signed   By: Beckie Salts M.D.   On: 05/10/2018 18:29   Result Date: 05/10/2018 CLINICAL DATA:  Fever.  Vomiting. EXAM: CT ABDOMEN AND PELVIS WITHOUT CONTRAST TECHNIQUE: Multidetector CT imaging of the abdomen and pelvis was performed following the standard protocol without IV contrast. COMPARISON:  None. FINDINGS: Lower chest: Small amount of bilateral dependent atelectasis or scarring. Mildly enlarged heart. Minimal pericardial effusion with a maximum thickness of 6 mm. Atheromatous coronary artery calcifications. Hepatobiliary: Very large gallstone in the gallbladder measuring 5.5 cm in maximum diameter. No gallbladder wall thickening or pericholecystic fluid. Unremarkable liver. Pancreas: Unremarkable. No pancreatic ductal dilatation or surrounding inflammatory changes. Spleen: Normal in size without focal abnormality. Adrenals/Urinary Tract: Adrenal glands are unremarkable. Kidneys are normal, without renal calculi, focal lesion, or hydronephrosis. Left posterolateral bladder diverticulum. Stomach/Bowel: Small hiatal hernia. Mild  colonic diverticulosis without evidence of diverticulitis. Normal appearing small bowel. No evidence of appendicitis. Vascular/Lymphatic: Atheromatous arterial calcifications without aneurysm. No enlarged lymph nodes. Reproductive: Multiple prostate radiation seed implants. Other: Small bilateral inguinal hernias containing fat. Small umbilical hernia containing fat. Musculoskeletal: Extensive lumbar and lower thoracic spine degenerative changes. IMPRESSION: 1. No acute abnormality. 2. Very large gallstone in the gallbladder without evidence of cholecystitis. 3. Small hiatal hernia. 4. Mild colonic diverticulosis. 5. Left bladder diverticulum. 6. Mild cardiomegaly and minimal pericardial effusion. 7.  Calcific atherosclerosis, including the coronary arteries. Electronically Signed: By: Beckie Salts M.D. On: 05/10/2018 15:38   Mr Abdomen Mrcp Wo Contrast  Result Date: 05/10/2018 CLINICAL DATA:  Abdominal pain with gallstones, biliary dilatation and possible choledocholithiasis on CT. Elevated liver function studies. EXAM: MRI ABDOMEN WITHOUT CONTRAST  (INCLUDING MRCP) TECHNIQUE: Multiplanar multisequence MR imaging of the abdomen was performed. Heavily T2-weighted images of the biliary and pancreatic ducts were obtained, and three-dimensional MRCP images were rendered by post processing. COMPARISON:  CT and ultrasound earlier the same date. FINDINGS: Despite efforts by the technologist and patient, moderate motion artifact is present on today's exam and could not be eliminated. This reduces exam sensitivity and specificity. Lower chest: Mild dependent atelectasis at both lung bases. No significant pleural effusion. Hepatobiliary: No focal hepatic abnormalities or significant steatosis. As on earlier studies, there is a large gallstone with mild gallbladder wall thickening. Evaluation of the biliary system is limited by the common hepatic duct measures up to 12 mm in diameter. As correlated with the previous  studies, there is persistent concern a small stone in the distal common bile duct, best seen on coronal image 24/4 and axial image 33/5. This is also seen on MRCP images 7 and 8 of series 6. Pancreas: Unremarkable. No pancreatic ductal dilatation or surrounding inflammatory changes. Spleen: Normal in size without focal abnormality. Adrenals/Urinary Tract: Both adrenal glands appear normal. Mild renal cortical thinning bilaterally and small bilateral renal cysts. No hydronephrosis. Stomach/Bowel: There are periampullary duodenal diverticula. No bowel wall thickening or significant distention demonstrated. Vascular/Lymphatic: There are no enlarged abdominal lymph nodes. Aortic and branch vessel atherosclerosis, better seen on CT. Other: Mild retroperitoneal edema without focal fluid collection or significant ascites. Musculoskeletal: No acute or significant osseous findings. Degenerative changes throughout the thoracolumbar spine. IMPRESSION: 1. Persistent concern of choledocholithiasis with associated extrahepatic biliary dilatation. Study is suboptimal due to breathing artifact. 2. Cholelithiasis with biliary dilatation, but no pericholecystic inflammation. Electronically Signed   By: Hilarie Fredrickson.D.  On: 05/10/2018 19:41   Dg Chest Port 1 View  Result Date: 05/10/2018 CLINICAL DATA:  Fever.  Sepsis. EXAM: PORTABLE CHEST 1 VIEW COMPARISON:  None. FINDINGS: Borderline enlarged cardiac silhouette. Mildly elevated right hemidiaphragm. Small amount of bilateral pleural thickening or fluid. Minimal bibasilar linear density. Mildly prominent pulmonary vasculature. Thoracic spine degenerative changes. Bilateral shoulder degenerative changes with superior migration of the right humeral head. IMPRESSION: 1. Borderline cardiomegaly and mild pulmonary vascular congestion. 2. Small amount of bilateral pleural thickening or fluid. 3. Minimal bibasilar atelectasis. 4. Bilateral shoulder degenerative changes with evidence  of a large, chronic rotator cuff tear on the right. Electronically Signed   By: Beckie SaltsSteven  Reid M.D.   On: 05/10/2018 14:23   [place] Abdomen Limited Ruq  Result Date: 05/10/2018 CLINICAL DATA:  Abdominal pain with fever and vomiting. Large gallstone on CT. EXAM: ULTRASOUND ABDOMEN LIMITED RIGHT UPPER QUADRANT COMPARISON:  CT earlier same date. FINDINGS: Gallbladder: In correlation with the CT done earlier today, there is a large shadowing gallstone in the gallbladder lumen. This measured up to 5.5 cm on CT and was partially calcified. Sludge is also present in the gallbladder lumen. There is mild gallbladder wall thickening, but no sonographic Murphy's sign. Common bile duct: Diameter: Moderately dilated, measuring up to 17 mm in diameter. No obstructing intraductal calculi are seen. However, in correlation with the earlier CT, there is suggestion of a distal common bile duct stone measuring up to 8 mm (coronal image 40/5 and sagittal image 63/6). Liver: No focal lesion identified. Within normal limits in parenchymal echogenicity. Portal vein is patent on color Doppler imaging with normal direction of blood flow towards the liver. IMPRESSION: 1. Large gallstone and gallbladder sludge with gallbladder wall thickening, but no sonographic Murphy's sign. 2. Extrahepatic biliary dilatation with concern of choledocholithiasis based on earlier CT. No stones are identified by this ultrasound. Consider MRCP for further evaluation. Electronically Signed   By: Carey BullocksWilliam  Veazey M.D.   On: 05/10/2018 18:18     Scheduled Meds: . amLODipine  5 mg Oral Daily  . atenolol  25 mg Oral Daily   Continuous Infusions: . sodium chloride 75 mL/hr at 05/12/18 0442  . piperacillin-tazobactam (ZOSYN)  IV Stopped (05/12/18 0848)     LOS: 2 days    Time spent: 30 minutes    Raylin Winer Hoover Brunette Uvaldo Rybacki, DO [facility] Pager [phone]  If 7PM-7AM, please contact night-coverage [email or web address] Password [email or web address] 05/12/2018, 11:25 AM

## 2018-05-12 NOTE — Progress Notes (Addendum)
  Subjective:  Patient states he did not have any problems with lunch or evening meal yesterday.  He ate it even though he did not have good appetite.  He denies abdominal pain.  He states left shoulder pain is back.  He also complains of pain across chest which is mild and only occurs when he takes a deep breath.  Objective: Blood pressure (!) 143/82, pulse 79, temperature (!) 97.2 F (36.2 C), temperature source Oral, resp. rate (!) 23, height 5\' 9"  (1.753 m), weight 175 lb 0.7 oz (79.4 kg), SpO2 99 %. Patient is alert and responds appropriately to questions. Sclera is icteric. Cardiac exam with regular rhythm normal S1 and S2.  No murmur gallop noted. Lungs are clear to auscultation. Abdomen is symmetrical soft and nontender without organomegaly or masses. No LE edema noted.  Labs/studies Results:  Recent Labs    05/10/18 1408 05/11/18 0623 05/12/18 0631  WBC 20.1* 22.8* 15.4*  HGB 12.6* 10.5* 11.2*  HCT 38.6* 32.2* 34.7*  PLT 267 212 226    BMET  Recent Labs    05/10/18 1408 05/11/18 0623  NA 137 140  K 3.6 3.4*  CL 100* 109  CO2 20* 25  GLUCOSE 230* 105*  BUN 21* 23*  CREATININE 1.74* 1.51*  CALCIUM 8.7* 7.8*    LFT  Recent Labs    05/10/18 1408 05/11/18 0623  PROT 6.8 5.3*  ALBUMIN 3.5 2.7*  AST 227* 103*  ALT 132* 82*  ALKPHOS 251* 140*  BILITOT 4.5* 4.1*    PT/INR  Recent Labs    05/12/18 0631  LABPROT 14.9  INR 1.18    Blood cultures positive for gram-negative rods.  ID pending.  Assessment:  #1.  Acute cholangitis.  Blood cultures positive for gram-negative rods.  Patient is on Zosyn.  WBC down to 13.4.  It was over 22,000 yesterday.  #2.  Cholelithiasis.  He has large calcified stone filling his gallbladder.  He will need cholecystectomy once duct is cleared.  #3.  Mild anemia possibly due to acute illness.  #4.  Left shoulder pain.  He had pain 2 days ago when he was pain-free yesterday.  Now he has pain.  It may be related to  sub-diaphragmatic process as would be chest pain which only occurs when he takes a deep breath.  He does not have pleural rub on exam.  #5.  Chronic kidney disease on reviewing his old records.  Renal function has improved with IV hydration and treatment of infection.  Plan:  Proceed with ERCP with sphincterotomy and stone extraction.

## 2018-05-12 NOTE — Progress Notes (Signed)
Brief ERCP note  Ampulla of water located along the right wall of a large duodenal diverticulum towards lower end. Mildly dilated CHD and CBD with single filling defect consistent with stone. Very low takeoff of cystic duct. Biliary sphincterotomy performed. Sphincterotomy dilated with 10 mm balloon. Small pigmented and large cholesterol stone removed using Dormia basket. Pancreatic duct not studied.

## 2018-05-12 NOTE — Addendum Note (Signed)
Addendum  created 05/12/18 0944 by Franco NonesYates, Vada Swift S, CRNA   Charge Capture section accepted, Sign clinical note

## 2018-05-12 NOTE — Op Note (Signed)
[facility] Patient Name: Robert Cardenas Procedure Date: 05/12/2018 7:29 AM MRN: [id number] Date of Birth: 01-28-1926 Attending MD: Lionel December , MD CSN: [id number] Age: 83 Admit Type: Inpatient Procedure:                ERCP Indications:              Bile duct stone(s), For therapy of ascending                            cholangitis Providers:                Lionel December, MD, Jannett Celestine, RN, Nena Polio, RN Referring MD:             Maurilio Lovely, DO and Larae Grooms, MD Medicines:                General Anesthesia Complications:            No immediate complications. Estimated Blood Loss:     Estimated blood loss was minimal. Procedure:                Pre-Anesthesia Assessment:                           - Prior to the procedure, a History and Physical                            was performed, and patient medications and                            allergies were reviewed. The patient's tolerance of                            previous anesthesia was also reviewed. The risks                            and benefits of the procedure and the sedation                            options and risks were discussed with the patient.                            All questions were answered, and informed consent                            was obtained. Prior Anticoagulants: The patient                            last took aspirin 1 day and heparin 1 day prior to                            the procedure. ASA Grade Assessment: III - A                            patient with severe systemic disease. After  reviewing the risks and benefits, the patient was                            deemed in satisfactory condition to undergo the                            procedure.                           After obtaining informed consent, the scope was                            passed under direct vision. Throughout the                            procedure, the patient's blood  pressure, pulse, and                            oxygen saturations were monitored continuously. The                            [id number] ([id number]) was introduced through the and                            used to inject contrast into and used to inject                            contrast into the bile duct. The ERCP was somewhat                            difficult due to abnormal anatomy. The patient                            tolerated the procedure well. Scope In: 8:32:51 AM Scope Out: 9:04:28 AM Total Procedure Duration: 0 hours 31 minutes 37 seconds  Findings:      The scout film was normal. The scope was advanced to a normal major       papilla in the descending duodenum. Examination of the pharynx, larynx       and associated structures, and upper GI tract was normal. The major       papilla was normal. The bile duct was deeply cannulated with the       traction (standard) sphincterotome and Autotome sphincterotome. Contrast       was injected. I personally interpreted the bile duct images. There was       brisk flow of contrast through the ducts. Image quality was excellent.       Contrast extended to the entire biliary tree. The main bile duct was       moderately dilated and diffusely dilated, acquired. The largest diameter       was 12 mm. The common hepatic duct contained one stone, which was 8 mm       in diameter. Dilation of major papilla with a 09-14-11 mm balloon (to a       maximum balloon size of 10 mm) dilator was successful. The biliary tree  was swept with a basket starting at the bifurcation. Two stones were       removed. No stones remained. Impression:               - The major papilla located along right wall of                            large duodenum diverticulum.                           - The entire main bile duct was moderately dilated,                            acquired.                           - Choledocholithiasis was found. Complete removal                             was accomplished by sweeping.                           - Major papilla was successfully dilated.                           - The biliary tree was swept. Moderate Sedation:      Per Anesthesia Care Recommendation:           - Avoid aspirin and nonsteroidal anti-inflammatory                            medicines for 3 days.                           - Return patient to hospital ward for ongoing care.                           - Clear liquid diet today.                           - Lab in am.                           - Cholecystectomy per Dr. Henreitta Leber. Procedure Code(s):        --- Professional ---                           [phone], Endoscopic retrograde                            cholangiopancreatography (ERCP); with removal of                            calculi/debris from biliary/pancreatic duct(s) Diagnosis Code(s):        --- Professional ---                           K80.30, Calculus of bile duct with  cholangitis,                            unspecified, without obstruction                           K83.09, Other cholangitis                           K83.8, Other specified diseases of biliary tract CPT copyright 2017 [employer]. All rights reserved. The codes documented in this report are preliminary and upon coder review may  be revised to meet current compliance requirements. Lionel December, MD Lionel December, MD 05/12/2018 9:24:34 AM This report has been signed electronically. Number of Addenda: 0

## 2018-05-12 NOTE — Progress Notes (Signed)
PHARMACY - PHYSICIAN COMMUNICATION CRITICAL VALUE ALERT - BLOOD CULTURE IDENTIFICATION (BCID)  Robert Cardenas is an 84 y.o. male who [facility]/2018 with a chief complaint of altered mental status  Assessment: 84 y.o. male with a large gallstone and leukocytosis and possible ascending cholangitis from choledocholithiasis. Blood cxs +e.coli. Patient from home and no recent admissions. Discussed with Dr. [name] and wants to continue with zosyn for treatment. Patient is now afebrile and WBC improved.  Name of physician (or Provider) Contacted: Dr. Clelia Cardenas  Current antibiotics: Zosyn 3.375 g IV q8h EID  Changes to prescribed antibiotics recommended:  Patient is on recommended antibiotics - No changes needed  Results for orders placed or performed during the hospital encounter of 05/10/18  Blood Culture ID Panel (Reflexed) (Collected: 05/10/2018  2:08 PM)  Result Value Ref Range   Enterococcus species NOT DETECTED NOT DETECTED   Listeria monocytogenes NOT DETECTED NOT DETECTED   Staphylococcus species NOT DETECTED NOT DETECTED   Staphylococcus aureus NOT DETECTED NOT DETECTED   Streptococcus species NOT DETECTED NOT DETECTED   Streptococcus agalactiae NOT DETECTED NOT DETECTED   Streptococcus pneumoniae NOT DETECTED NOT DETECTED   Streptococcus pyogenes NOT DETECTED NOT DETECTED   Acinetobacter baumannii NOT DETECTED NOT DETECTED   Enterobacteriaceae species DETECTED (A) NOT DETECTED   Enterobacter cloacae complex NOT DETECTED NOT DETECTED   Escherichia coli DETECTED (A) NOT DETECTED   Klebsiella oxytoca NOT DETECTED NOT DETECTED   Klebsiella pneumoniae NOT DETECTED NOT DETECTED   Proteus species NOT DETECTED NOT DETECTED   Serratia marcescens NOT DETECTED NOT DETECTED   Carbapenem resistance NOT DETECTED NOT DETECTED   Haemophilus influenzae NOT DETECTED NOT DETECTED   Neisseria meningitidis NOT DETECTED NOT DETECTED   Pseudomonas aeruginosa NOT DETECTED NOT DETECTED   Candida albicans NOT DETECTED NOT DETECTED   Candida glabrata NOT DETECTED NOT DETECTED   Candida krusei NOT DETECTED NOT DETECTED   Candida parapsilosis NOT DETECTED NOT DETECTED   Candida tropicalis NOT DETECTED NOT DETECTED   Robert Cardenas, BS Loura BackPharm D, BCPS Clinical Pharmacist Pager [phone] 05/12/2018  1:29 PM

## 2018-05-13 ENCOUNTER — Inpatient Hospital Stay (HOSPITAL_COMMUNITY): Payer: Medicare Other | Admitting: Anesthesiology

## 2018-05-13 ENCOUNTER — Encounter (HOSPITAL_COMMUNITY)
Admission: EM | Disposition: A | Discharge status: Home / Self Care | Payer: Self-pay | Source: Home / Self Care | Attending: Internal Medicine

## 2018-05-13 ENCOUNTER — Encounter (HOSPITAL_COMMUNITY): Payer: Self-pay | Admitting: Internal Medicine

## 2018-05-13 DIAGNOSIS — R001 Bradycardia, unspecified: Secondary | ICD-10-CM

## 2018-05-13 DIAGNOSIS — I441 Atrioventricular block, second degree: Secondary | ICD-10-CM

## 2018-05-13 DIAGNOSIS — I459 Conduction disorder, unspecified: Secondary | ICD-10-CM

## 2018-05-13 HISTORY — PX: CHOLECYSTECTOMY: SHX55

## 2018-05-13 LAB — COMPREHENSIVE METABOLIC PANEL
ALBUMIN: 2.9 g/dL — AB (ref 3.5–5.0)
ALK PHOS: 138 U/L — AB (ref 38–126)
ALT: 52 U/L (ref 17–63)
AST: 41 U/L (ref 15–41)
Anion gap: 8 (ref 5–15)
BILIRUBIN TOTAL: 2.9 mg/dL — AB (ref 0.3–1.2)
BUN: 15 mg/dL (ref 6–20)
CO2: 26 mmol/L (ref 22–32)
CREATININE: 1.41 mg/dL — AB (ref 0.61–1.24)
Calcium: 8.1 mg/dL — ABNORMAL LOW (ref 8.9–10.3)
Chloride: 106 mmol/L (ref 101–111)
GFR calc Af Amer: 49 mL/min — ABNORMAL LOW (ref >60)
GFR, EST NON AFRICAN AMERICAN: 43 mL/min — AB (ref >60)
GLUCOSE: 101 mg/dL — AB (ref 65–99)
Potassium: 3.8 mmol/L (ref 3.5–5.1)
Sodium: 140 mmol/L (ref 135–145)
TOTAL PROTEIN: 5.8 g/dL — AB (ref 6.5–8.1)

## 2018-05-13 LAB — CBC
HCT: 33.9 % — ABNORMAL LOW (ref 39.0–52.0)
HEMOGLOBIN: 10.9 g/dL — AB (ref 13.0–17.0)
MCH: 32.6 pg (ref 26.0–34.0)
MCHC: 32.2 g/dL (ref 30.0–36.0)
MCV: 101.5 fL — AB (ref 78.0–100.0)
Platelets: 213 10*3/uL (ref 150–400)
RBC: 3.34 MIL/uL — AB (ref 4.22–5.81)
RDW: 14.3 % (ref 11.5–15.5)
WBC: 10.9 10*3/uL — AB (ref 4.0–10.5)

## 2018-05-13 LAB — TROPONIN I
Troponin I: 0.03 ng/mL (ref <0.03)
Troponin I: 0.03 ng/mL (ref <0.03)
Troponin I: <0.03 ng/mL (ref <0.03)

## 2018-05-13 LAB — AMYLASE: AMYLASE: 28 U/L (ref 28–100)

## 2018-05-13 LAB — MAGNESIUM: Magnesium: 2 mg/dL (ref 1.7–2.4)

## 2018-05-13 LAB — MRSA PCR SCREENING: MRSA by PCR: NEGATIVE

## 2018-05-13 SURGERY — LAPAROSCOPIC CHOLECYSTECTOMY
Anesthesia: General

## 2018-05-13 MED ORDER — GLYCOPYRROLATE 0.2 MG/ML IJ SOLN
INTRAMUSCULAR | Status: AC
  Filled 2018-05-13: qty 1

## 2018-05-13 MED ORDER — OXYCODONE HCL 5 MG PO TABS
5.0000 mg | ORAL_TABLET | ORAL | Status: DC | PRN
  Administered 2018-05-13: 10 mg via ORAL
  Filled 2018-05-13: qty 2

## 2018-05-13 MED ORDER — PROMETHAZINE HCL 25 MG/ML IJ SOLN: 6.2500 mg | INTRAMUSCULAR | Status: DC | PRN

## 2018-05-13 MED ORDER — NEOSTIGMINE METHYLSULFATE 10 MG/10ML IV SOLN
INTRAVENOUS | Status: DC | PRN
Start: 2018-05-13 — End: 2018-05-13
  Administered 2018-05-13: 3 mg via INTRAVENOUS

## 2018-05-13 MED ORDER — ETOMIDATE 2 MG/ML IV SOLN
INTRAVENOUS | Status: DC | PRN
  Administered 2018-05-13: 6 mg via INTRAVENOUS
  Administered 2018-05-13: 10 mg via INTRAVENOUS

## 2018-05-13 MED ORDER — LIDOCAINE HCL (PF) 1 % IJ SOLN
INTRAMUSCULAR | Status: AC
  Filled 2018-05-13: qty 5

## 2018-05-13 MED ORDER — GLYCOPYRROLATE 0.2 MG/ML IJ SOLN
INTRAMUSCULAR | Status: DC | PRN
  Administered 2018-05-13: 0.2 mg via INTRAVENOUS
  Administered 2018-05-13: .6 mg via INTRAVENOUS

## 2018-05-13 MED ORDER — SODIUM CHLORIDE 0.9 % IJ SOLN
INTRAMUSCULAR | Status: AC
Start: 2018-05-13 — End: ?
  Filled 2018-05-13: qty 10

## 2018-05-13 MED ORDER — ROCURONIUM 10MG/ML (10ML) SYRINGE FOR MEDFUSION PUMP - OPTIME
INTRAVENOUS | Status: DC | PRN
  Administered 2018-05-13 (×3): 5 mg via INTRAVENOUS
  Administered 2018-05-13: 30 mg via INTRAVENOUS

## 2018-05-13 MED ORDER — HYDROCODONE-ACETAMINOPHEN 7.5-325 MG PO TABS: 1.0000 | ORAL_TABLET | Freq: Once | ORAL | Status: DC | PRN

## 2018-05-13 MED ORDER — ROCURONIUM BROMIDE 50 MG/5ML IV SOLN
INTRAVENOUS | Status: AC
Start: 2018-05-13 — End: ?
  Filled 2018-05-13: qty 1

## 2018-05-13 MED ORDER — LACTATED RINGERS IV SOLN: INTRAVENOUS | Status: DC

## 2018-05-13 MED ORDER — HEMOSTATIC AGENTS (NO CHARGE) OPTIME
TOPICAL | Status: DC | PRN
  Administered 2018-05-13 (×2): 1 via TOPICAL

## 2018-05-13 MED ORDER — HEMOSTATIC AGENTS (NO CHARGE) OPTIME
TOPICAL | Status: DC | PRN
  Administered 2018-05-13: 1 via TOPICAL

## 2018-05-13 MED ORDER — ETOMIDATE 2 MG/ML IV SOLN
INTRAVENOUS | Status: AC
  Filled 2018-05-13: qty 10

## 2018-05-13 MED ORDER — BUPIVACAINE HCL (PF) 0.5 % IJ SOLN
INTRAMUSCULAR | Status: DC | PRN
  Administered 2018-05-13: 20 mL

## 2018-05-13 MED ORDER — EPHEDRINE SULFATE 50 MG/ML IJ SOLN
INTRAMUSCULAR | Status: AC
  Filled 2018-05-13: qty 1

## 2018-05-13 MED ORDER — MEPERIDINE HCL 50 MG/ML IJ SOLN: 6.2500 mg | INTRAMUSCULAR | Status: DC | PRN

## 2018-05-13 MED ORDER — BUPIVACAINE HCL (PF) 0.5 % IJ SOLN
INTRAMUSCULAR | Status: AC
  Filled 2018-05-13: qty 30

## 2018-05-13 MED ORDER — HYDROMORPHONE HCL 1 MG/ML IJ SOLN
0.2500 mg | INTRAMUSCULAR | Status: DC | PRN
  Administered 2018-05-13: 0.25 mg via INTRAVENOUS
  Administered 2018-05-13: 0.5 mg via INTRAVENOUS
  Administered 2018-05-13: 0.25 mg via INTRAVENOUS
  Administered 2018-05-13: 0.5 mg via INTRAVENOUS
  Filled 2018-05-13 (×3): qty 0.5

## 2018-05-13 MED ORDER — LIDOCAINE HCL (CARDIAC) PF 50 MG/5ML IV SOSY
PREFILLED_SYRINGE | INTRAVENOUS | Status: DC | PRN
  Administered 2018-05-13: 40 mg via INTRAVENOUS

## 2018-05-13 MED ORDER — SODIUM CHLORIDE 0.9 % IR SOLN
Status: DC | PRN
  Administered 2018-05-13: 1000 mL

## 2018-05-13 MED ORDER — LACTATED RINGERS IV SOLN
INTRAVENOUS | Status: DC
  Administered 2018-05-13: 1000 mL via INTRAVENOUS
  Administered 2018-05-13: 14:00:00 via INTRAVENOUS

## 2018-05-13 MED ORDER — FENTANYL CITRATE (PF) 250 MCG/5ML IJ SOLN
INTRAMUSCULAR | Status: AC
  Filled 2018-05-13: qty 5

## 2018-05-13 MED ORDER — FENTANYL CITRATE (PF) 100 MCG/2ML IJ SOLN
INTRAMUSCULAR | Status: DC | PRN
  Administered 2018-05-13 (×2): 50 ug via INTRAVENOUS
  Administered 2018-05-13 (×3): 25 ug via INTRAVENOUS
  Administered 2018-05-13: 50 ug via INTRAVENOUS
  Administered 2018-05-13: 25 ug via INTRAVENOUS

## 2018-05-13 MED ORDER — MORPHINE SULFATE (PF) 2 MG/ML IV SOLN
2.0000 mg | INTRAVENOUS | Status: DC | PRN
  Administered 2018-05-13 – 2018-05-14 (×2): 2 mg via INTRAVENOUS
  Filled 2018-05-13 (×2): qty 1

## 2018-05-13 SURGICAL SUPPLY — 52 items
APPLICATOR ARISTA FLEXITIP XL (MISCELLANEOUS) ×1 IMPLANT
APPLIER CLIP ROT 10 11.4 M/L (STAPLE) ×2
BAG RETRIEVAL 10 (BASKET) ×1
BLADE SURG 15 STRL LF DISP TIS (BLADE) ×1 IMPLANT
BLADE SURG 15 STRL SS (BLADE) ×1
CHLORAPREP W/TINT 26ML (MISCELLANEOUS) ×2 IMPLANT
CLIP APPLIE ROT 10 11.4 M/L (STAPLE) ×1 IMPLANT
CLOTH BEACON ORANGE TIMEOUT ST (SAFETY) ×2 IMPLANT
COVER LIGHT HANDLE STERIS (MISCELLANEOUS) ×4 IMPLANT
CUTTER FLEX LINEAR 45M (STAPLE) ×1 IMPLANT
DECANTER SPIKE VIAL GLASS SM (MISCELLANEOUS) ×2 IMPLANT
DERMABOND ADVANCED (GAUZE/BANDAGES/DRESSINGS) ×2
DERMABOND ADVANCED .7 DNX12 (GAUZE/BANDAGES/DRESSINGS) ×1 IMPLANT
ELECT REM PT RETURN 9FT ADLT (ELECTROSURGICAL) ×2
ELECTRODE REM PT RTRN 9FT ADLT (ELECTROSURGICAL) ×1 IMPLANT
FILTER SMOKE EVAC LAPAROSHD (FILTER) ×2 IMPLANT
GLOVE BIO SURGEON STRL SZ 6.5 (GLOVE) ×2 IMPLANT
GLOVE BIOGEL PI IND STRL 6.5 (GLOVE) ×1 IMPLANT
GLOVE BIOGEL PI IND STRL 7.0 (GLOVE) ×3 IMPLANT
GLOVE BIOGEL PI IND STRL 7.5 (GLOVE) IMPLANT
GLOVE BIOGEL PI INDICATOR 6.5 (GLOVE) ×2
GLOVE BIOGEL PI INDICATOR 7.0 (GLOVE) ×4
GLOVE BIOGEL PI INDICATOR 7.5 (GLOVE) ×1
GLOVE ECLIPSE 7.0 STRL STRAW (GLOVE) ×1 IMPLANT
GOWN STRL REUS W/TWL LRG LVL3 (GOWN DISPOSABLE) ×6 IMPLANT
HEMOSTAT ARISTA ABSORB 3G PWDR (MISCELLANEOUS) ×1 IMPLANT
HEMOSTAT SNOW SURGICEL 2X4 (HEMOSTASIS) ×3 IMPLANT
INST SET LAPROSCOPIC AP (KITS) ×2 IMPLANT
IV NS IRRIG 3000ML ARTHROMATIC (IV SOLUTION) IMPLANT
KIT TURNOVER KIT A (KITS) ×2 IMPLANT
MANIFOLD NEPTUNE II (INSTRUMENTS) ×2 IMPLANT
NDL INSUFFLATION 14GA 120MM (NEEDLE) ×1 IMPLANT
NEEDLE INSUFFLATION 14GA 120MM (NEEDLE) ×2 IMPLANT
NS IRRIG 1000ML POUR BTL (IV SOLUTION) ×2 IMPLANT
PACK LAP CHOLE LZT030E (CUSTOM PROCEDURE TRAY) ×2 IMPLANT
PAD ARMBOARD 7.5X6 YLW CONV (MISCELLANEOUS) ×2 IMPLANT
RELOAD 45 VASCULAR/THIN (ENDOMECHANICALS) ×2 IMPLANT
RELOAD STAPLE 45 2.5 WHT GRN (ENDOMECHANICALS) IMPLANT
SET BASIN LINEN APH (SET/KITS/TRAYS/PACK) ×2 IMPLANT
SET TUBE IRRIG SUCTION NO TIP (IRRIGATION / IRRIGATOR) IMPLANT
SLEEVE ENDOPATH XCEL 5M (ENDOMECHANICALS) ×2 IMPLANT
SPONGE GAUZE 2X2 8PLY STRL LF (GAUZE/BANDAGES/DRESSINGS) IMPLANT
SUT MNCRL AB 4-0 PS2 18 (SUTURE) ×2 IMPLANT
SUT VICRYL 0 UR6 27IN ABS (SUTURE) ×2 IMPLANT
SYS BAG RETRIEVAL 10MM (BASKET) ×1
SYSTEM BAG RETRIEVAL 10MM (BASKET) ×1 IMPLANT
TROCAR ENDO BLADELESS 11MM (ENDOMECHANICALS) ×2 IMPLANT
TROCAR XCEL NON-BLD 5MMX100MML (ENDOMECHANICALS) ×2 IMPLANT
TROCAR XCEL UNIV SLVE 11M 100M (ENDOMECHANICALS) ×2 IMPLANT
TUBE CONNECTING 12X1/4 (SUCTIONS) ×2 IMPLANT
TUBING INSUFFLATION (TUBING) ×2 IMPLANT
WARMER LAPAROSCOPE (MISCELLANEOUS) ×2 IMPLANT
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
## 2018-05-13 NOTE — Progress Notes (Signed)
[facility]  Updated Dr. Sherryll BurgerShah. No major issues. Hold heparin / lovenox for 24 hrs. Clear diet as tolerated. Monitor.  Post op EKG ordered and done. Looks similar to preop to me. No over changes. No complaints from the patient of any chest pain or SOB.   Algis GreenhouseLindsay Nathasha Fiorillo, MD [facility] [address] [place], [place] [id number] [phone] (office)

## 2018-05-13 NOTE — Anesthesia Procedure Notes (Addendum)
Procedure Name: Intubation Date/Time: 05/13/2018 12:07 PM Performed by: Vista Deck, CRNA Pre-anesthesia Checklist: Patient identified, Patient being monitored, Timeout performed, Emergency Drugs available and Suction available Patient Re-evaluated:Patient Re-evaluated prior to induction Oxygen Delivery Method: Circle System Utilized Preoxygenation: Pre-oxygenation with 100% oxygen Induction Type: IV induction Ventilation: Mask ventilation without difficulty Laryngoscope Size: Mac and 3 Grade View: Grade II Tube type: Subglottic suction tube Tube size: 8.0 mm Number of attempts: 1 Airway Equipment and Method: stylet Placement Confirmation: ETT inserted through vocal cords under direct vision,  positive ETCO2 and breath sounds checked- equal and bilateral Secured at: 21 cm Tube secured with: Tape Dental Injury: Teeth and Oropharynx as per pre-operative assessment

## 2018-05-13 NOTE — Anesthesia Postprocedure Evaluation (Signed)
Anesthesia Post Note  Patient: Robert Cardenas  Procedure(s) Performed: LAPAROSCOPIC CHOLECYSTECTOMY possible open (N/A )  Patient location during evaluation: PACU Anesthesia Type: General Level of consciousness: awake and alert and patient cooperative Pain management: satisfactory to patient Vital Signs Assessment: post-procedure vital signs reviewed and stable Respiratory status: spontaneous breathing and patient connected to nasal cannula oxygen Cardiovascular status: stable Postop Assessment: no apparent nausea or vomiting Anesthetic complications: no     Last Vitals:  Vitals:   05/13/18 1445 05/13/18 1500  BP: (!) 155/70 (!) 125/56  Pulse: 85 (!) 57  Resp: (!) 23 15  Temp:    SpO2: 96% 95%    Last Pain:  Vitals:   05/13/18 1515  TempSrc:   PainSc: 4                  Rosamae Rocque

## 2018-05-13 NOTE — Progress Notes (Signed)
Tele called stating pt with 2nd degree AVB type 1 with non-conducting p waves. RN visualized cardiac rhythm and noticed 2nd degree type 2. Earlier this shift pt noted to have a 2.5sec pause. Robb Matarrtiz, MD notified earlier of pause and now concerning AVB. EKG, labs, and transfer to stepdown ordered. Atenolol d/c'd. VSS. NAD. Pt states feels fine. Will continue to monitor and prepare for transfer.

## 2018-05-13 NOTE — Progress Notes (Signed)
Night shift floor coverage note.  Telemetry reported that the patient is having bradycardia with Mobitz type II block.  This is confirmed by EKG.  The chart was reviewed.  He has been on atenolol 25 mg p.o. daily for years and does not recall any issues with this previously.  He denies chest pain, dizziness, palpitations, diaphoresis, nausea, emesis, abdominal pain, diarrhea or constipation.  Most recent vital signs temperature 98.6 F, pulse 36, respirations 16, blood pressure 132/69 mmHg and O2 sat 97% on room air.  HEENT: normocephalic. NECK:.  Supple, no JVD LUNGS: CTA. HEART: S1-S2, bradycardic in the mid 40s. ABDOMEN: soft nontender. EXT. No edema. NEURO: AAOX3.  A/P Bradycardia with recent episode of a heart block  EKG shows sinus bradycardia 55 bpm with PVCs. Discontinue atenolol. Check morning labs, troponin level and magnesium now. Transfer to stepdown/ICU. Consult cardiology later today.  Sanda Kleinavid Ortiz, MD  Over 45 minutes were spent during the process of this medical emergent event.   This document was prepared using Dragon voice recognition software and may contain some unintended transcription errors.

## 2018-05-13 NOTE — Progress Notes (Signed)
PROGRESS NOTE    Robert Cardenas  NWG:[id number] DOB: Apr 10, 1926 DOA: 05/10/2018 PCP: Carylon Perches, MD   Brief Narrative:   Robert Cardenas is a 84 y.o. male with a history of hypertension, gout, prostate cancer status post brachytherapy.  Patient brought to the emergency department by EMS due to being found with a fever, altered mental status, and an episode of vomiting.  Patient was seen passed out in his car.    He has had some intermittent left shoulder pain as well as right upper quadrant abdominal pain in the last several days.  He has been admitted with suspicion of a sending cholangitis secondary to choledocholithiasis as noted on imaging studies.  He has been improving on IV Zosyn with noted E. coli bacteremia.  He has developed bradycardia overnight on 6/9 and is noted to have questionable Mobitz 2 AV block with nonconducted PACs.  He has been seen by cardiology and is approved for laparoscopic cholecystectomy this morning.   Assessment & Plan:   Principal Problem:   Sepsis (HCC) Active Problems:   HTN (hypertension)   Cholelithiasis   Elevated LFTs   Elevated troponin   Choledocholithiasis   1. Sepsis secondary to likely ascending cholangitis with choledocholithiasis-improving.  He is s/p ERCP with plans for cholecystectomy this am as discussed with Dr. Henreitta Leber. Low risk for surgery per Cardiology. 2. Asymptomatic bradycardia.  Likely secondary to intermittent nonconducted PACs versus Mobitz 2.  Atenolol has been held with monitoring in stepdown unit.  Appreciate cardiology recommendations. 3. E.Coli bacteremia secondary to above. Continue IV Zosyn and await sensitivities. Clinically improving. 4. Elevated troponins.  Likely secondary to demand ischemia from above.  Trend has remained flat with no suspicion of ACS. No chest pain. 5. Transaminitis.  Continue to trend due to above process.  Total bilirubin is downtrending. 6. Mild hypokalemia-resolved.  Monitor on repeat  labwork. 7. Hypertension. Starting to become elevated and will restart medications.   DVT prophylaxis: SCDs Code Status: Full Family Communication: None at bedside Disposition Plan: Per GI/GS   Consultants:   GI and GS  Procedures:   None  Antimicrobials:   Zosyn 6/7->   Subjective: Patient seen and evaluated today with no new acute complaints or concerns.  He denies any chest pain or shortness of breath and has developed a significant bradycardia arrhythmia overnight for which he is being monitored in stepdown unit.  He is currently hemodynamically stable.  Objective: Vitals:   05/13/18 1055 05/13/18 1100 05/13/18 1105 05/13/18 1110  BP:   (!) 142/65   Pulse:      Resp: (!) 33 (!) 28 18 (!) 27  Temp:      TempSrc:      SpO2: 95% 95% 95% 93%  Weight:      Height:        Intake/Output Summary (Last 24 hours) at 05/13/2018 1433 Last data filed at 05/13/2018 1432 Gross per 24 hour  Intake 3408.75 ml  Output 700 ml  Net 2708.75 ml   Filed Weights   05/10/18 1343 05/10/18 1925 05/13/18 0427  Weight: 83.9 kg (185 lb) 79.4 kg (175 lb 0.7 oz) 75.1 kg (165 lb 9.1 oz)    Examination:  General exam: Appears calm and comfortable; hard of hearing Respiratory system: Clear to auscultation. Respiratory effort normal. Cardiovascular system: S1 & S2 heard, RRR. No JVD, murmurs, rubs, gallops or clicks. No pedal edema. Gastrointestinal system: Abdomen is nondistended, soft and nontender. No organomegaly or masses felt. Normal bowel sounds  heard. Central nervous system: Alert and oriented. No focal neurological deficits. Extremities: Symmetric 5 x 5 power. Skin: No rashes, lesions or ulcers Psychiatry: Judgement and insight appear normal. Mood & affect appropriate.     Data Reviewed: I have personally reviewed following labs and imaging studies  CBC: Recent Labs  Lab 05/10/18 1408 05/11/18 0623 05/12/18 0631 05/13/18 0331  WBC 20.1* 22.8* 15.4* 10.9*  NEUTROABS  19.8*  --   --   --   HGB 12.6* 10.5* 11.2* 10.9*  HCT 38.6* 32.2* 34.7* 33.9*  MCV 101.3* 102.2* 101.5* 101.5*  PLT 267 212 226 213   Basic Metabolic Panel: Recent Labs  Lab 05/10/18 1408 05/11/18 0623 05/12/18 0631 05/13/18 0331  NA 137 140 140 140  K 3.6 3.4* 3.6 3.8  CL 100* 109 108 106  CO2 20* 25 23 26   GLUCOSE 230* 105* 128* 101*  BUN 21* 23* 20 15  CREATININE 1.74* 1.51* 1.53* 1.41*  CALCIUM 8.7* 7.8* 8.3* 8.1*  MG  --   --  1.9 2.0   GFR: Estimated Creatinine Clearance: 35.5 mL/min (A) (by C-G formula based on SCr of 1.41 mg/dL (H)). Liver Function Tests: Recent Labs  Lab 05/10/18 1408 05/11/18 0623 05/12/18 0631 05/13/18 0331  AST 227* 103* 65* 41  ALT 132* 82* 69* 52  ALKPHOS 251* 140* 142* 138*  BILITOT 4.5* 4.1* 3.4* 2.9*  PROT 6.8 5.3* 5.8* 5.8*  ALBUMIN 3.5 2.7* 2.9* 2.9*   Recent Labs  Lab 05/10/18 1556 05/13/18 0331  LIPASE 21  --   AMYLASE  --  28   No results for input(s): AMMONIA in the last 168 hours. Coagulation Profile: Recent Labs  Lab 05/12/18 0631  INR 1.18   Cardiac Enzymes: Recent Labs  Lab 05/10/18 1756 05/10/18 2331 05/11/18 0623 05/13/18 0331 05/13/18 0909  TROPONINI 0.08* 0.07* 0.06* 0.03* 0.03*   BNP (last 3 results) No results for input(s): PROBNP in the last 8760 hours. HbA1C: No results for input(s): HGBA1C in the last 72 hours. CBG: No results for input(s): GLUCAP in the last 168 hours. Lipid Profile: No results for input(s): CHOL, HDL, LDLCALC, TRIG, CHOLHDL, LDLDIRECT in the last 72 hours. Thyroid Function Tests: No results for input(s): TSH, T4TOTAL, FREET4, T3FREE, THYROIDAB in the last 72 hours. Anemia Panel: No results for input(s): VITAMINB12, FOLATE, FERRITIN, TIBC, IRON, RETICCTPCT in the last 72 hours. Sepsis Labs: Recent Labs  Lab 05/10/18 1408 05/10/18 1556 05/10/18 2332  LATICACIDVEN 7.1* 4.7* 1.3    Recent Results (from the past 240 hour(s))  Blood Culture (routine x 2)     Status:  Abnormal (Preliminary result)   Collection Time: 05/10/18  2:08 PM  Result Value Ref Range Status   Specimen Description   Final    RIGHT ANTECUBITAL BOTTLES DRAWN AEROBIC AND ANAEROBIC Performed at [facility], [address] [place], [place] [address]    Special Requests   Final    Blood Culture adequate volume Performed at [facility], [address], [place], [place] [address]    Culture  Setup Time   Final    GRAM NEGATIVE RODS AEROBIC BOTTLE ONLY Gram Stain Report Called to,Read Back By and Verified With: DUNN @ 0706 ON [id number] BY HENDERSON L. CRITICAL RESULT CALLED TO, READ BACK BY AND VERIFIED WITH: L POOLE,PHARMD AT 1310 05/12/18 BY L BENFIELD    Culture (A)  Final    ESCHERICHIA COLI SUSCEPTIBILITIES TO FOLLOW Performed at [facility], [address] [place],  [place] [address]    Report Status PENDING  Incomplete  Blood Culture ID Panel (Reflexed)     Status: Abnormal   Collection Time: 05/10/18  2:08 PM  Result Value Ref Range Status   Enterococcus species NOT DETECTED NOT DETECTED Final   Listeria monocytogenes NOT DETECTED NOT DETECTED Final   Staphylococcus species NOT DETECTED NOT DETECTED Final   Staphylococcus aureus NOT DETECTED NOT DETECTED Final   Streptococcus species NOT DETECTED NOT DETECTED Final   Streptococcus agalactiae NOT DETECTED NOT DETECTED Final   Streptococcus pneumoniae NOT DETECTED NOT DETECTED Final   Streptococcus pyogenes NOT DETECTED NOT DETECTED Final   Acinetobacter baumannii NOT DETECTED NOT DETECTED Final   Enterobacteriaceae species DETECTED (A) NOT DETECTED Final    Comment: Enterobacteriaceae represent a large family of gram-negative bacteria, not a single organism. CRITICAL RESULT CALLED TO, READ BACK BY AND VERIFIED WITH: L POOLE,PHARMD AT 1310 05/12/18 BY L BENFIELD    Enterobacter cloacae complex NOT DETECTED NOT DETECTED Final   Escherichia coli DETECTED (A) NOT DETECTED Final    Comment: CRITICAL RESULT CALLED TO,  READ BACK BY AND VERIFIED WITH: L POOLE,PHARMD AT 1310 05/12/18 BY L BENFIELD    Klebsiella oxytoca NOT DETECTED NOT DETECTED Final   Klebsiella pneumoniae NOT DETECTED NOT DETECTED Final   Proteus species NOT DETECTED NOT DETECTED Final   Serratia marcescens NOT DETECTED NOT DETECTED Final   Carbapenem resistance NOT DETECTED NOT DETECTED Final   Haemophilus influenzae NOT DETECTED NOT DETECTED Final   Neisseria meningitidis NOT DETECTED NOT DETECTED Final   Pseudomonas aeruginosa NOT DETECTED NOT DETECTED Final   Candida albicans NOT DETECTED NOT DETECTED Final   Candida glabrata NOT DETECTED NOT DETECTED Final   Candida krusei NOT DETECTED NOT DETECTED Final   Candida parapsilosis NOT DETECTED NOT DETECTED Final   Candida tropicalis NOT DETECTED NOT DETECTED Final    Comment: Performed at [facility], [address] [place], [place] [address]  Blood Culture (routine x 2)     Status: None (Preliminary result)   Collection Time: 05/10/18  2:10 PM  Result Value Ref Range Status   Specimen Description   Final    BLOOD LEFT HAND Performed at [facility], [address] [place], [place] [address]    Special Requests   Final    BOTTLES DRAWN AEROBIC AND ANAEROBIC Blood Culture adequate volume Performed at [facility], [address] [place], [place] [address]    Culture  Setup Time   Final    GRAM NEGATIVE RODS Gram Stain Report Called to,Read Back By and Verified With: HEARN,J @ 0510 ON 05/13/18 BY JUW GS DONE @ [facility] AEROBIC BOTTLE ONLY CRITICAL VALUE NOTED.  VALUE IS CONSISTENT WITH PREVIOUSLY REPORTED AND CALLED VALUE. Performed at [facility], [address] [place], [place] [address]    Culture GRAM NEGATIVE RODS  Final   Report Status PENDING  Incomplete  MRSA PCR Screening     Status: None   Collection Time: 05/13/18  4:17 AM  Result Value Ref Range Status   MRSA by PCR NEGATIVE NEGATIVE Final    Comment:        The GeneXpert MRSA Assay (FDA approved for  NASAL specimens only), is one component of a comprehensive MRSA colonization surveillance program. It is not intended to diagnose MRSA infection nor to guide or monitor treatment for MRSA infections. Performed at [facility], [address] [place], [place] [address]  Radiology Studies: Dg Ercp Biliary & Pancreatic Ducts  Result Date: 05/12/2018 CLINICAL DATA:  Cholangitis.  Choledocholithiasis. EXAM: ERCP TECHNIQUE: Multiple spot images obtained with the fluoroscopic device and submitted for interpretation post-procedure. FLUOROSCOPY TIME:  Fluoroscopy Time:  1 minutes and 10 seconds Number of Acquired Spot Images: 6 COMPARISON:  MRI 05/10/2018 FINDINGS: Cannulation and opacification of the biliary system. Dilated extrahepatic biliary system with biliary stones. Wire was advanced into the biliary system and evidence for stone retrieval with a basket. IMPRESSION: Choledocholithiasis and stone retrieval procedure. These images were submitted for radiologic interpretation only. Please see the procedural report for the amount of contrast and the fluoroscopy time utilized. Electronically Signed   By: Richarda Overlie M.D.   On: 05/12/2018 16:48     Scheduled Meds: . [MAR Hold] amLODipine  5 mg Oral Daily   Continuous Infusions: . sodium chloride 75 mL/hr at 05/12/18 0442  . lactated ringers 1,000 mL (05/13/18 1110)  . lactated ringers    . [MAR Hold] piperacillin-tazobactam (ZOSYN)  IV Stopped (05/13/18 1023)     LOS: 3 days    Time spent: 30 minutes    Faithanne Verret Hoover Brunette, DO [facility] Pager [phone]  If 7PM-7AM, please contact night-coverage [email or web address] Password [email or web address] 05/13/2018, 2:33 PM

## 2018-05-13 NOTE — Progress Notes (Signed)
Report called to receiving ICU RN. Informed of anticipated cholecystectomy today and newly identified heart block. Transferred to ICU/stepdown via wheelchair on telemetry by Kenney Housemananya, RN and Dodi, NT. NAD upon departure.

## 2018-05-13 NOTE — Op Note (Addendum)
Operative Note   Preoperative Diagnosis: Choledocholithiasis, cholelithiasis, ascending cholangitis    Postoperative Diagnosis: Same   Procedure(s) Performed: Laparoscopic cholecystectomy   Surgeon: Lillia AbedLindsay C. Henreitta LeberBridges, MD   Assistants: No qualified resident was available   Anesthesia: General endotracheal   Anesthesiologist: Dr. Molli Poseyameransi    Specimens: Gallbladder    Estimated Blood Loss: 150cc   Blood Replacement: None    Complications: None    Operative Findings: Extensive omental adhesions, large gallbladder filled with large stone as seen on imaging (5.5cm), sludge with small stones surrounding the large stone   Procedure: The patient was taken to the operating room and placed supine. General endotracheal anesthesia was induced. Intravenous antibiotics were administered per protocol. An orogastric tube positioned to decompress the stomach. The abdomen was prepared and draped in the usual sterile fashion.    A supraumbilical incision was made and a Veress technique was utilized to achieve pneumoperitoneum to 15 mmHg with carbon dioxide. A 11 mm optiview port was placed through the supraumbilical region, and a 10 mm 0-degree operative laparoscope was introduced. The area underlying the trocar and Veress needle were inspected and without evidence of injury.  Remaining trocars were placed under direct vision. Two 5 mm ports were placed in the right abdomen, between the anterior axillary and midclavicular line.  A final 11 mm port was placed through the mid-epigastrium, near the falciform ligament.    There was significant omental adhesions in the RUQ and along the edge of the liver. These were taken down with cautery.  Once this was complete, the gallbladder fundus was elevated cephalad, and more adhesions were taken down laterally and inferiorly until the infundibulum could be visualized. The gallbladder was large and was filled with the large stone, but there was some wall that could  be grasped.  This part of the infundibulum was retracted to the patient's right. Given the extensive inflammation, I started by mobilizing the gallbladder off the liver on the medial and lateral sides to see if I could establish the inferior border of the gallbladder better.  I was able to do this but did get into the gallbladder.  There was minimal spillage of bile and stones due to the large size of the main stone. But there were some smaller stones noted.  On freeing up the inferior border of the gallbladder, and the lateral edge,  I skeletonized the cystic artery that was coming up and crossing over the infundibulum and cystic duct.  I was able to see the hepatic bed / critical view between the two duct structures and both clearly entering the gallbladder, and to aid in the mobilization and skeletonization of the cystic duct, I went ahead and I clipped the cystic artery twice and divided it.  There was a pulse noted in the stump after this was clipped.  The gallbladder/cystic duct junction was further skeletonized.  The wall of the gallbladder was overall thin and the cystic duct /gallbladder junction tore slightly as I tried to remove the fibrous inflammation.  Given this, I attempted to place clips on the cystic duct but they barely crossed the duct.  I decided to use a vascular staple load to transect the cystic duct given that it was already tearing, and the clips were barely crossing the duct.  I could see the common bile duct inferior and there was clearance from the staple line to the common bile duct without narrowing it in any way with the stapler.    The gallbladder was  then dissected from the liver bed with electrocautery. The specimen was placed in an Endopouch.  I left the Endopouch bag in the abdomen, and obtained hemostasis of the liver bed which was generally oozing.  This was achieved the electrocautery and with [place] placement.  Once hemostasis was adequate, the specimen was  retrieved through the epigastric site in the Endopouch bag. The site had to be enlarged to remove the gallbladder with the large stone.   The epigastric site was closed in a running fashion with a 0 Vicryl suture.  We then reinsufflated and final inspection revealed acceptable hemostasis. I did place a second piece of Surgical Snow in the hepatic bed.  The trocars were removed and pneumoperitoneum released. The umbilical port site was closed with a 0 Vicryl suture.  Skin incisions were closed with 4-0 Monocryl subcuticular sutures and Dermabond. The patient was awakened from anesthesia and extubated without complication.   The patient did well during anesthesia and waking up.  He will be maintained on the monitor and in the ICU. I ordered a post operative EKG as per cardiology recommendations.    Algis Greenhouse, MD [facility] [address] [place], [place] [address] [phone] (office)

## 2018-05-13 NOTE — Progress Notes (Signed)
  Subjective:  Patient denies chest pain or shortness of breath.  He complains of nausea and pain at right upper quadrant when he takes a deep breath.  Objective: Blood pressure (!) 120/57, pulse 74, temperature 98.3 F (36.8 C), resp. rate 18, height 5\' 9"  (1.753 m), weight 165 lb 9.1 oz (75.1 kg), SpO2 (!) 89 %. Patient is alert and oriented to place and person.  Labs/studies Results:  Recent Labs    05/11/18 0623 05/12/18 0631 05/13/18 0331  WBC 22.8* 15.4* 10.9*  HGB 10.5* 11.2* 10.9*  HCT 32.2* 34.7* 33.9*  PLT 212 226 213    BMET  Recent Labs    05/11/18 0623 05/12/18 0631 05/13/18 0331  NA 140 140 140  K 3.4* 3.6 3.8  CL 109 108 106  CO2 25 23 26   GLUCOSE 105* 128* 101*  BUN 23* 20 15  CREATININE 1.51* 1.53* 1.41*  CALCIUM 7.8* 8.3* 8.1*    LFT  Recent Labs    05/11/18 0623 05/12/18 0631 05/13/18 0331  PROT 5.3* 5.8* 5.8*  ALBUMIN 2.7* 2.9* 2.9*  AST 103* 65* 41  ALT 82* 69* 52  ALKPHOS 140* 142* 138*  BILITOT 4.1* 3.4* 2.9*    PT/INR  Recent Labs    05/12/18 0631  LABPROT 14.9  INR 1.18    Blood cultures positive for Enterobacteriaceae and E. coli.  Assessment:  #1.  Acute cholangitis.  Patient underwent therapeutic ERCP yesterday and he underwent laparoscopic cholecystectomy by Dr. Henreitta LeberBridges.  He appears to be doing well other than postop pain. He can begin to improvement in leukocytosis and continued improvement in cholestasis.  #2.  Mildly elevated troponin level.  Cardiology consultation appreciated.  #3.  Anemia possibly due to acute illness  Recommendations:  Repeat LFTs in a.m.

## 2018-05-13 NOTE — Anesthesia Preprocedure Evaluation (Signed)
Anesthesia Evaluation  Patient identified by MRN, date of birth, ID band Patient awake    Reviewed: Allergy & Precautions, H&P , NPO status , Patient's Chart, lab work & pertinent test results, reviewed documented beta blocker date and time   Airway Mallampati: III  TM Distance: >3 FB Neck ROM: full    Dental no notable dental hx. (+) Edentulous Upper, Dental Advidsory Given   Pulmonary neg pulmonary ROS,    Pulmonary exam normal breath sounds clear to auscultation       Cardiovascular Exercise Tolerance: Good hypertension, On Medications negative cardio ROS  + dysrhythmias  Rhythm:regular Rate:Normal     Neuro/Psych negative neurological ROS  negative psych ROS   GI/Hepatic negative GI ROS, Neg liver ROS,   Endo/Other  negative endocrine ROS  Renal/GU CRFnegative Renal ROS  negative genitourinary   Musculoskeletal   Abdominal   Peds  Hematology negative hematology ROS (+)   Anesthesia Other Findings Recently reported "syncope" apparently related to sepsis from GB disease.  Resolved with tx with IV Fluids and ABX Newly dx Mobitz II, s/p cardiac eval.  Determined no need for echo in setting of mild cardiomegaly. Episode of bradycardia 6/9 (38-40) resolved and bblocker held today. Cards requests postop EKG.  Labs reviewed. D/W pt and son cardiac status and plan for induction as well as possibility of extended intubation.  Questions addressed. NPO p mn with IV fluid resuscitation.  VSS   Reproductive/Obstetrics negative OB ROS                             Anesthesia Physical Anesthesia Plan  ASA: IV  Anesthesia Plan: General ETT   Post-op Pain Management:    Induction:   PONV Risk Score and Plan:   Airway Management Planned:   Additional Equipment:   Intra-op Plan:   Post-operative Plan:   Informed Consent: I have reviewed the patients History and Physical, chart, labs and  discussed the procedure including the risks, benefits and alternatives for the proposed anesthesia with the patient or authorized representative who has indicated his/her understanding and acceptance.   Dental Advisory Given  Plan Discussed with: CRNA and Anesthesiologist  Anesthesia Plan Comments:         Anesthesia Quick Evaluation

## 2018-05-13 NOTE — Interval H&P Note (Signed)
History and Physical Interval Note:  05/13/2018 10:04 AM  Robert Cardenas  has presented today for surgery, with the diagnosis of choledocolithiasis, cholelithiasis  The various methods of treatment have been discussed with the patient and family. After consideration of risks, benefits and other options for treatment, the patient has consented to  Procedure(s): LAPAROSCOPIC CHOLECYSTECTOMY possible open (N/A) as a surgical intervention .  The patient's history has been reviewed, patient examined, no change in status, stable for surgery.  I have reviewed the patient's chart and labs.  Questions were answered to the patient's satisfaction.    Patient with choledocholithiasis and ascending cholangitis s/p ERCP yesterday that did well with anesthesia. Overnight had bradycardia and concern for Mobitz II. Dr. Diona BrownerMcDowell has seen him and believes the "bradycardia look to be most consistent with sinus bradycardia and intermittent nonconducted and sometimes sequential PACs."  He denies any chest pain or pressure. He does report some pain in the chest with coughing but not anything aside from that.  He was sleeping when this bradycardia occurred, and BP was 140s.    I have discussed the options of surgery today as cardiology thinks he is appropriate as long as he stays monitored versus scheduling as outpatient in the upcoming weeks. Discussed risk of having additional choledocholithiasis / pancreatitis in the future.  Given everything, the patient and son want to proceed. They understand cardiac risk and need for monitoring and possibility of staying intubated after surgery.   Appreciate Cardiology, Anesthesia, Hospitalist assistance with this complicated patient.    Lucretia RoersLindsay C Kathelyn Gombos

## 2018-05-13 NOTE — Progress Notes (Signed)
Patient belongings packed into patient belonging back. Items included are :2 pair white socks, black pair of pants, brown pair of shoes,, cell phone with black case, and charger.  Patient placed his hearing aids on and placed dentures in mouth before transfer.

## 2018-05-13 NOTE — Transfer of Care (Signed)
Immediate Anesthesia Transfer of Care Note  Patient: Robert Cardenas  Procedure(s) Performed: LAPAROSCOPIC CHOLECYSTECTOMY possible open (N/A )  Patient Location: PACU  Anesthesia Type:General  Level of Consciousness: awake and alert   Airway & Oxygen Therapy: Patient Spontanous Breathing  Post-op Assessment: Report given to RN  Post vital signs: Reviewed  Last Vitals:  Vitals Value Taken Time  BP 145/73 05/13/2018  2:30 PM  Temp    Pulse 77 05/13/2018  2:32 PM  Resp 22 05/13/2018  2:32 PM  SpO2 94 % 05/13/2018  2:32 PM  Vitals shown include unvalidated device data.  Last Pain:  Vitals:   05/13/18 1036  TempSrc: Oral  PainSc: 0-No pain      Patients Stated Pain Goal: 7 (05/13/18 1036)  Complications: No apparent anesthesia complications

## 2018-05-14 ENCOUNTER — Encounter (HOSPITAL_COMMUNITY): Payer: Self-pay | Admitting: General Surgery

## 2018-05-14 ENCOUNTER — Inpatient Hospital Stay (HOSPITAL_COMMUNITY): Payer: Medicare Other

## 2018-05-14 DIAGNOSIS — I340 Nonrheumatic mitral (valve) insufficiency: Secondary | ICD-10-CM

## 2018-05-14 DIAGNOSIS — I499 Cardiac arrhythmia, unspecified: Secondary | ICD-10-CM

## 2018-05-14 DIAGNOSIS — I361 Nonrheumatic tricuspid (valve) insufficiency: Secondary | ICD-10-CM

## 2018-05-14 LAB — COMPREHENSIVE METABOLIC PANEL
ALK PHOS: 124 U/L (ref 38–126)
ALT: 56 U/L (ref 17–63)
ANION GAP: 9 (ref 5–15)
AST: 61 U/L — ABNORMAL HIGH (ref 15–41)
Albumin: 2.8 g/dL — ABNORMAL LOW (ref 3.5–5.0)
BILIRUBIN TOTAL: 2.6 mg/dL — AB (ref 0.3–1.2)
BUN: 17 mg/dL (ref 6–20)
CALCIUM: 8.1 mg/dL — AB (ref 8.9–10.3)
CO2: 26 mmol/L (ref 22–32)
Chloride: 103 mmol/L (ref 101–111)
Creatinine, Ser: 1.52 mg/dL — ABNORMAL HIGH (ref 0.61–1.24)
GFR calc non Af Amer: 39 mL/min — ABNORMAL LOW (ref >60)
GFR, EST AFRICAN AMERICAN: 45 mL/min — AB (ref >60)
GLUCOSE: 172 mg/dL — AB (ref 65–99)
Potassium: 3.7 mmol/L (ref 3.5–5.1)
Sodium: 138 mmol/L (ref 135–145)
TOTAL PROTEIN: 5.8 g/dL — AB (ref 6.5–8.1)

## 2018-05-14 LAB — CULTURE, BLOOD (ROUTINE X 2): SPECIAL REQUESTS: ADEQUATE

## 2018-05-14 LAB — ECHOCARDIOGRAM COMPLETE
Height: 69 in
Weight: 2649.05 [oz_av]

## 2018-05-14 LAB — CBC
HCT: 35.1 % — ABNORMAL LOW (ref 39.0–52.0)
HEMOGLOBIN: 11.3 g/dL — AB (ref 13.0–17.0)
MCH: 32.9 pg (ref 26.0–34.0)
MCHC: 32.2 g/dL (ref 30.0–36.0)
MCV: 102.3 fL — ABNORMAL HIGH (ref 78.0–100.0)
PLATELETS: 230 10*3/uL (ref 150–400)
RBC: 3.43 MIL/uL — AB (ref 4.22–5.81)
RDW: 14.2 % (ref 11.5–15.5)
WBC: 20 10*3/uL — ABNORMAL HIGH (ref 4.0–10.5)

## 2018-05-14 MED ORDER — PIPERACILLIN-TAZOBACTAM 3.375 G IVPB
3.3750 g | Freq: Three times a day (TID) | INTRAVENOUS | Status: DC
Start: 2018-05-14 — End: 2018-05-16
  Administered 2018-05-14 – 2018-05-16 (×7): 3.375 g via INTRAVENOUS
  Filled 2018-05-14 (×5): qty 50

## 2018-05-14 MED ORDER — CIPROFLOXACIN HCL 250 MG PO TABS: 500.0000 mg | ORAL_TABLET | Freq: Two times a day (BID) | ORAL | Status: DC

## 2018-05-14 NOTE — Progress Notes (Signed)
Pharmacy Antibiotic Note  Robert Cardenas is a 84 y.o. male admitted on 05/10/2018 with sepsis.  Pharmacy has been consulted for zosyn dosing.  Plan: Discussed with MD, he would like to continue Zosyn for 7 days. Zosyn 3.375g IV q8h (4 hour infusion).  F/u cxs and clinical progress Monitor V/S, labs  Height: 5\' 9"  (175.3 cm) Weight: 165 lb 9.1 oz (75.1 kg) IBW/kg (Calculated) : 70.7  Temp (24hrs), Avg:98.1 F (36.7 C), Min:97.8 F (36.6 C), Max:98.5 F (36.9 C)  Recent Labs  Lab 05/10/18 1408 05/10/18 1556 05/10/18 2332 05/11/18 0623 05/12/18 0631 05/13/18 0331 05/14/18 0515  WBC 20.1*  --   --  22.8* 15.4* 10.9* 20.0*  CREATININE 1.74*  --   --  1.51* 1.53* 1.41* 1.52*  LATICACIDVEN 7.1* 4.7* 1.3  --   --   --   --     Estimated Creatinine Clearance: 32.9 mL/min (A) (by C-G formula based on SCr of 1.52 mg/dL (H)).    Antimicrobials this admission: Zosyn 6/7 >>  Vancomycin x 1 dose in ED 6/7   Dose adjustments this admission: n/a   Microbiology results: 6/7 BCx: Ecoli - PAN Sensitive  Thank you for allowing pharmacy to be a part of this patient's care.  Robert Cardenas, Robert Cardenas, [facility] 05/14/2018 11:00 AM

## 2018-05-14 NOTE — Care Management Note (Addendum)
Case Management Note  Patient Details  Name: Robert Cardenas MRN: [id number] DateLoyal Gambler of Birth: 03/22/1926  Subjective/Objective:  Sepsis/ascending cholangitis. ERCP and cholecystectomy.  From home alone, ind with ADL's. Walks with cane. Has PCP-Dr. Ouida SillsFagan. Still drives. No issues affording medications.               Action/Plan: Anticipate DC home. CM following for needs.   Expected Discharge Date:   05/15/2018           Expected Discharge Plan:  Home/Self Care  In-House Referral:     Discharge planning Services  CM Consult  Post Acute Care Choice:  NA Choice offered to:  NA  DME Arranged:    DME Agency:     HH Arranged:    HH Agency:     Status of Service:  Completed, signed off  If discussed at [employer] Length of Stay Meetings, dates discussed:    Additional Comments:  Darius Fillingim, Chrystine OilerSharley Diane, RN 05/14/2018, 11:30 AM

## 2018-05-14 NOTE — Progress Notes (Addendum)
   Reviewed Cardiology consult note from yesterday. Patient underwent laparoscopic cholecystectomy on 05/13/2018 with no immediate complications noted. Post-operative EKG reviewed and shows NSR with 1st degree AV block and occasional PVC's. Telemetry shows NSR, HR 60's to 80's with occasional PVC's. No significant episodes of bradycardia or arrhythmias noted. Would continue to hold Atenolol. Can further titrate Amlodipine if additional BP control is needed.   Signed, Ellsworth LennoxBrittany M Strader, PA-C 05/14/2018, 10:19 AM Pager: [phone]  Telemetry reviewed. Agree with above.

## 2018-05-14 NOTE — Progress Notes (Signed)
PROGRESS NOTE    Robert Cardenas  BJY:[id number] DOB: 14-Dec-1925 DOA: 05/10/2018 PCP: Carylon Perches, MD   Brief Narrative:   Robert Cardenas is a 84 y.o. male with a history of hypertension, gout, prostate cancer status post brachytherapy.  Patient brought to the emergency department by EMS due to being found with a fever, altered mental status, and an episode of vomiting.  Patient was seen passed out in his car.    He has had some intermittent left shoulder pain as well as right upper quadrant abdominal pain in the last several days.  He has been admitted with suspicion of a sending cholangitis secondary to choledocholithiasis as noted on imaging studies.  He has been improving on IV Zosyn with noted E. coli bacteremia.  He had developed bradycardia overnight on 6/9 and was noted to have nonconducted PACs.  He has been seen by cardiology and is approved for laparoscopic cholecystectomy and underwent the procedure on 6/11 without complication.  He is doing much better this morning and does not seem to have any further arrhythmias on telemetry.  Atenolol continues to be withheld and he continues to remain on Zosyn with improvement in symptoms.   Assessment & Plan:   Principal Problem:   Sepsis (HCC) Active Problems:   HTN (hypertension)   Cholelithiasis   Elevated LFTs   Elevated troponin   Choledocholithiasis   Cardiac arrhythmia   1. Sepsis secondary to likely ascending cholangitis with choledocholithiasis-improving.  He is s/p ERCP and cholecystectomy which was performed on 6/10.  Much improved and remains on Zosyn.  See bacteremia recommendations below.  Continue diet advancement per general surgery recommendations. 2. Asymptomatic bradycardia.  Likely secondary to intermittent nonconducted PACs versus Mobitz 2.  Atenolol continues to be withheld with no further symptomatology.  Amlodipine is continued.  Appreciate cardiology recommendations.  Okay for transfer to telemetry today. 3. E.Coli  bacteremia secondary to above. Continue IV Zosyn for now. Would not convert to oral as he is close to finishing treatment regimen.  Recommended to have 7-day course as he is clinically improved.  He is currently day 5 of 7 and can likely discharge once this course is completed in 2 days. 4. Elevated troponins.  Likely secondary to demand ischemia from above.  Trend has remained flat with no suspicion of ACS. No chest pain. 5. Transaminitis.  Continue to trend due to above process.  Total bilirubin is downtrending. 6. Mild hypokalemia-resolved.  Monitor on repeat labwork. 7. Hypertension.  Well-controlled, continue amlodipine.   DVT prophylaxis: SCDs Code Status: Full Family Communication: None at bedside Disposition Plan: Continue diet advancement as tolerated.  Transfer to telemetry floor today.  Continue IV Zosyn for 2 more days to finish a 7-day course regimen for E. coli bacteremia with good clinical response.   Consultants:   GI and GS  Procedures:   None  Antimicrobials:   Zosyn 6/7->   Subjective: Patient seen and evaluated today with no new acute complaints or concerns.  He is overall doing much better and is responding well to IV Zosyn which the E. coli susceptible to.  No acute concerns overnight with normal sinus rhythm noted on telemetry as well as some occasional PVCs.  His diet will be advanced by general surgery.  Objective: Vitals:   05/14/18 0700 05/14/18 0729 05/14/18 0900 05/14/18 1000  BP: 132/66  133/61 118/60  Pulse: 60  (!) 57 (!) 58  Resp: 15  18 17   Temp:  97.8 F (36.6 C)  TempSrc:      SpO2: 94%  97% 92%  Weight:      Height:        Intake/Output Summary (Last 24 hours) at 05/14/2018 1058 Last data filed at 05/14/2018 0901 Gross per 24 hour  Intake 3825 ml  Output 700 ml  Net 3125 ml   Filed Weights   05/10/18 1925 05/13/18 0427 05/14/18 0500  Weight: 79.4 kg (175 lb 0.7 oz) 75.1 kg (165 lb 9.1 oz) 75.1 kg (165 lb 9.1 oz)     Examination:  General exam: Appears calm and comfortable; hard of hearing Respiratory system: Clear to auscultation. Respiratory effort normal. Cardiovascular system: S1 & S2 heard, RRR. No JVD, murmurs, rubs, gallops or clicks. No pedal edema. Gastrointestinal system: Abdomen is nondistended, soft and nontender. No organomegaly or masses felt. Normal bowel sounds heard.  Abdominal incision clean dry and intact. Central nervous system: Alert and oriented. No focal neurological deficits. Extremities: Symmetric 5 x 5 power. Skin: No rashes, lesions or ulcers Psychiatry: Judgement and insight appear normal. Mood & affect appropriate.     Data Reviewed: I have personally reviewed following labs and imaging studies  CBC: Recent Labs  Lab 05/10/18 1408 05/11/18 0623 05/12/18 0631 05/13/18 0331 05/14/18 0515  WBC 20.1* 22.8* 15.4* 10.9* 20.0*  NEUTROABS 19.8*  --   --   --   --   HGB 12.6* 10.5* 11.2* 10.9* 11.3*  HCT 38.6* 32.2* 34.7* 33.9* 35.1*  MCV 101.3* 102.2* 101.5* 101.5* 102.3*  PLT 267 212 226 213 230   Basic Metabolic Panel: Recent Labs  Lab 05/10/18 1408 05/11/18 0623 05/12/18 0631 05/13/18 0331 05/14/18 0515  NA 137 140 140 140 138  K 3.6 3.4* 3.6 3.8 3.7  CL 100* 109 108 106 103  CO2 20* 25 23 26 26   GLUCOSE 230* 105* 128* 101* 172*  BUN 21* 23* 20 15 17   CREATININE 1.74* 1.51* 1.53* 1.41* 1.52*  CALCIUM 8.7* 7.8* 8.3* 8.1* 8.1*  MG  --   --  1.9 2.0  --    GFR: Estimated Creatinine Clearance: 32.9 mL/min (A) (by C-G formula based on SCr of 1.52 mg/dL (H)). Liver Function Tests: Recent Labs  Lab 05/10/18 1408 05/11/18 0623 05/12/18 0631 05/13/18 0331 05/14/18 0515  AST 227* 103* 65* 41 61*  ALT 132* 82* 69* 52 56  ALKPHOS 251* 140* 142* 138* 124  BILITOT 4.5* 4.1* 3.4* 2.9* 2.6*  PROT 6.8 5.3* 5.8* 5.8* 5.8*  ALBUMIN 3.5 2.7* 2.9* 2.9* 2.8*   Recent Labs  Lab 05/10/18 1556 05/13/18 0331  LIPASE 21  --   AMYLASE  --  28   No results  for input(s): AMMONIA in the last 168 hours. Coagulation Profile: Recent Labs  Lab 05/12/18 0631  INR 1.18   Cardiac Enzymes: Recent Labs  Lab 05/10/18 2331 05/11/18 0623 05/13/18 0331 05/13/18 0909 05/13/18 1543  TROPONINI 0.07* 0.06* 0.03* 0.03* <0.03   BNP (last 3 results) No results for input(s): PROBNP in the last 8760 hours. HbA1C: No results for input(s): HGBA1C in the last 72 hours. CBG: No results for input(s): GLUCAP in the last 168 hours. Lipid Profile: No results for input(s): CHOL, HDL, LDLCALC, TRIG, CHOLHDL, LDLDIRECT in the last 72 hours. Thyroid Function Tests: No results for input(s): TSH, T4TOTAL, FREET4, T3FREE, THYROIDAB in the last 72 hours. Anemia Panel: No results for input(s): VITAMINB12, FOLATE, FERRITIN, TIBC, IRON, RETICCTPCT in the last 72 hours. Sepsis Labs: Recent Labs  Lab 05/10/18 1408 05/10/18  1556 05/10/18 2332  LATICACIDVEN 7.1* 4.7* 1.3    Recent Results (from the past 240 hour(s))  Blood Culture (routine x 2)     Status: Abnormal   Collection Time: 05/10/18  2:08 PM  Result Value Ref Range Status   Specimen Description   Final    RIGHT ANTECUBITAL BOTTLES DRAWN AEROBIC AND ANAEROBIC Performed at [facility], [address] [place], [place] [address]    Special Requests   Final    Blood Culture adequate volume Performed at [facility], [address] [place], [place] [address]    Culture  Setup Time   Final    GRAM NEGATIVE RODS AEROBIC BOTTLE ONLY Gram Stain Report Called to,Read Back By and Verified With: DUNN @ 0706 ON [id number] BY HENDERSON L. CRITICAL RESULT CALLED TO, READ BACK BY AND VERIFIED WITH: L POOLE,PHARMD AT 1310 05/12/18 BY L BENFIELD Performed at [facility], [address] [place], [place] [address]    Culture ESCHERICHIA COLI (A)  Final   Report Status 05/14/2018 FINAL  Final   Organism ID, Bacteria ESCHERICHIA COLI  Final      Susceptibility   Escherichia coli - MIC*    AMPICILLIN <=2  SENSITIVE Sensitive     CEFAZOLIN <=4 SENSITIVE Sensitive     CEFEPIME <=1 SENSITIVE Sensitive     CEFTAZIDIME <=1 SENSITIVE Sensitive     CEFTRIAXONE <=1 SENSITIVE Sensitive     CIPROFLOXACIN <=0.25 SENSITIVE Sensitive     GENTAMICIN <=1 SENSITIVE Sensitive     IMIPENEM <=0.25 SENSITIVE Sensitive     TRIMETH/SULFA <=20 SENSITIVE Sensitive     AMPICILLIN/SULBACTAM <=2 SENSITIVE Sensitive     PIP/TAZO <=4 SENSITIVE Sensitive     Extended ESBL NEGATIVE Sensitive     * ESCHERICHIA COLI  Blood Culture ID Panel (Reflexed)     Status: Abnormal   Collection Time: 05/10/18  2:08 PM  Result Value Ref Range Status   Enterococcus species NOT DETECTED NOT DETECTED Final   Listeria monocytogenes NOT DETECTED NOT DETECTED Final   Staphylococcus species NOT DETECTED NOT DETECTED Final   Staphylococcus aureus NOT DETECTED NOT DETECTED Final   Streptococcus species NOT DETECTED NOT DETECTED Final   Streptococcus agalactiae NOT DETECTED NOT DETECTED Final   Streptococcus pneumoniae NOT DETECTED NOT DETECTED Final   Streptococcus pyogenes NOT DETECTED NOT DETECTED Final   Acinetobacter baumannii NOT DETECTED NOT DETECTED Final   Enterobacteriaceae species DETECTED (A) NOT DETECTED Final    Comment: Enterobacteriaceae represent a large family of gram-negative bacteria, not a single organism. CRITICAL RESULT CALLED TO, READ BACK BY AND VERIFIED WITH: L POOLE,PHARMD AT 1310 05/12/18 BY L BENFIELD    Enterobacter cloacae complex NOT DETECTED NOT DETECTED Final   Escherichia coli DETECTED (A) NOT DETECTED Final    Comment: CRITICAL RESULT CALLED TO, READ BACK BY AND VERIFIED WITH: L POOLE,PHARMD AT 1310 05/12/18 BY L BENFIELD    Klebsiella oxytoca NOT DETECTED NOT DETECTED Final   Klebsiella pneumoniae NOT DETECTED NOT DETECTED Final   Proteus species NOT DETECTED NOT DETECTED Final   Serratia marcescens NOT DETECTED NOT DETECTED Final   Carbapenem resistance NOT DETECTED NOT DETECTED Final   Haemophilus  influenzae NOT DETECTED NOT DETECTED Final   Neisseria meningitidis NOT DETECTED NOT DETECTED Final   Pseudomonas aeruginosa NOT DETECTED NOT DETECTED Final   Candida albicans NOT DETECTED NOT DETECTED Final   Candida glabrata NOT DETECTED NOT DETECTED Final   Candida krusei NOT DETECTED NOT DETECTED Final  Candida parapsilosis NOT DETECTED NOT DETECTED Final   Candida tropicalis NOT DETECTED NOT DETECTED Final    Comment: Performed at [facility], [address], [place], [place] [address]  Blood Culture (routine x 2)     Status: None (Preliminary result)   Collection Time: 05/10/18  2:10 PM  Result Value Ref Range Status   Specimen Description   Final    BLOOD LEFT HAND Performed at [facility], [address] [place], [place] [address]    Special Requests   Final    BOTTLES DRAWN AEROBIC AND ANAEROBIC Blood Culture adequate volume Performed at [facility], [address], [place], [place] [address]    Culture  Setup Time   Final    GRAM NEGATIVE RODS Gram Stain Report Called to,Read Back By and Verified With: HEARN,J @ 0510 ON 05/13/18 BY JUW GS DONE @ [facility] AEROBIC BOTTLE ONLY CRITICAL VALUE NOTED.  VALUE IS CONSISTENT WITH PREVIOUSLY REPORTED AND CALLED VALUE.    Culture   Final    GRAM NEGATIVE RODS IDENTIFICATION TO FOLLOW Performed at [facility], [address] [place], [place] [address]    Report Status PENDING  Incomplete  MRSA PCR Screening     Status: None   Collection Time: 05/13/18  4:17 AM  Result Value Ref Range Status   MRSA by PCR NEGATIVE NEGATIVE Final    Comment:        The GeneXpert MRSA Assay (FDA approved for NASAL specimens only), is one component of a comprehensive MRSA colonization surveillance program. It is not intended to diagnose MRSA infection nor to guide or monitor treatment for MRSA infections. Performed at [facility], [address] [place], [place] [address]       Radiology Studies: No results  found.   Scheduled Meds: . amLODipine  5 mg Oral Daily   Continuous Infusions: . sodium chloride 75 mL/hr at 05/14/18 0800     LOS: 4 days    Time spent: 30 minutes    Breeze Berringer Hoover Brunette, DO [facility] Pager [phone]  If 7PM-7AM, please contact night-coverage [email or web address] Password [facility] 05/14/2018, 10:58 AM

## 2018-05-14 NOTE — Progress Notes (Signed)
  Echocardiogram 2D Echocardiogram has been performed.  Leta JunglingCooper, Kert Shackett M 05/14/2018, 12:53 PM

## 2018-05-14 NOTE — Progress Notes (Signed)
[facility] Progress Note  1 Day Post-Op  Subjective: Looking great this AM. Sitting up and eating clears for breakfast. Wanting cold water. Appetite not great.  Says he feels much better. No cardiac issues overnight on monitor. Post op EKG and troponin without concern.    Objective: Vital signs in last 24 hours: Temp:  [97.8 F (36.6 C)-98.5 F (36.9 C)] 97.8 F (36.6 C) (06/11 0729) Pulse Rate:  [44-115] 57 (06/11 0900) Resp:  [15-33] 18 (06/11 0900) BP: (102-155)/(56-90) 133/61 (06/11 0900) SpO2:  [89 %-98 %] 97 % (06/11 0900) Weight:  [165 lb 9.1 oz (75.1 kg)] 165 lb 9.1 oz (75.1 kg) (06/11 0500)    Intake/Output from previous day: 06/10 0701 - 06/11 0700 In: 3625 [I.V.:3425; IV Piggyback:200] Out: 400 [Urine:250; Blood:150] Intake/Output this shift: Total I/O In: 250 [P.O.:100; I.V.:150] Out: 300 [Urine:300]  General appearance: alert, cooperative and no distress Resp: normal work breathing GI: soft, appropriately tender, port sites c/d/i with dermabond, no erythema or drainage  Lab Results:  Recent Labs    05/13/18 0331 05/14/18 0515  WBC 10.9* 20.0*  HGB 10.9* 11.3*  HCT 33.9* 35.1*  PLT 213 230   BMET Recent Labs    05/13/18 0331 05/14/18 0515  NA 140 138  K 3.8 3.7  CL 106 103  CO2 26 26  GLUCOSE 101* 172*  BUN 15 17  CREATININE 1.41* 1.52*  CALCIUM 8.1* 8.1*   PT/INR Recent Labs    05/12/18 0631  LABPROT 14.9  INR 1.18    Studies/Results: No results found.  Anti-infectives: Anti-infectives (From admission, onward)   Start     Dose/Rate Route Frequency Ordered Stop   05/13/18 0600  cefoTEtan (CEFOTAN) 2 g in sodium chloride 0.9 % 100 mL IVPB     2 g 200 mL/hr over 30 Minutes Intravenous On call to O.R. 05/12/18 1135 05/13/18 1237   05/10/18 2200  piperacillin-tazobactam (ZOSYN) IVPB 3.375 g     3.375 g 12.5 mL/hr over 240 Minutes Intravenous Every 8 hours 05/10/18 1850     05/10/18 1430  vancomycin (VANCOCIN)  1,500 mg in sodium chloride 0.9 % 500 mL IVPB     1,500 mg 250 mL/hr over 120 Minutes Intravenous  Once 05/10/18 1403 05/10/18 1735   05/10/18 1400  piperacillin-tazobactam (ZOSYN) IVPB 3.375 g     3.375 g 100 mL/hr over 30 Minutes Intravenous  Once 05/10/18 1350 05/10/18 1501   05/10/18 1400  vancomycin (VANCOCIN) IVPB 1000 mg/200 mL premix  Status:  Discontinued     1,000 mg 200 mL/hr over 60 Minutes Intravenous  Once 05/10/18 1350 05/10/18 1403      Assessment/Plan: Robert Cardenas is a 84 yo s/p ERCP and Lap chole for choledocholithiasis and cholangitis. He is doing great. Eating this AM. Feeling well. E coli sensitivities not back yet.  Diet as tolerate Dr. Sherryll BurgerShah transition to oral once sensitivities known for bacteremia related to cholangitis.  Cardiology follow for Bradycardia, no further issues since surgery  Hopefully can be discharged tomorrow WBC likely post surgical inflammation, monitor, T bili continuing to trend down after ERCP    LOS: 4 days    Robert Cardenas 05/14/2018

## 2018-05-14 NOTE — Progress Notes (Signed)
  Subjective:  Patient states he is much better.  He does not have nausea.  He has localized pain below the right costal margin.  He does not have much pain when he takes deep breath.  He denies diarrhea.  He had no difficulty with full liquid lunch.  Objective: Blood pressure 126/69, pulse 72, temperature 98.5 F (36.9 C), temperature source Oral, resp. rate 18, height 5\' 9"  (1.753 m), weight 165 lb 9.1 oz (75.1 kg), SpO2 90 %. Patient is alert and he is oriented to place and person. Abdomen is symmetrical.  It is soft with mild tenderness in the right subcostal region around the site of surgical wound.  Organomegaly or masses.  Labs/studies Results:  Recent Labs    05/12/18 0631 05/13/18 0331 05/14/18 0515  WBC 15.4* 10.9* 20.0*  HGB 11.2* 10.9* 11.3*  HCT 34.7* 33.9* 35.1*  PLT 226 213 230    BMET  Recent Labs    05/12/18 0631 05/13/18 0331 05/14/18 0515  NA 140 140 138  K 3.6 3.8 3.7  CL 108 106 103  CO2 23 26 26   GLUCOSE 128* 101* 172*  BUN 20 15 17   CREATININE 1.53* 1.41* 1.52*  CALCIUM 8.3* 8.1* 8.1*    LFT  Recent Labs    05/12/18 0631 05/13/18 0331 05/14/18 0515  PROT 5.8* 5.8* 5.8*  ALBUMIN 2.9* 2.9* 2.8*  AST 65* 41 61*  ALT 69* 52 56  ALKPHOS 142* 138* 124  BILITOT 3.4* 2.9* 2.6*    PT/INR  Recent Labs    05/12/18 0631  LABPROT 14.9  INR 1.18     Assessment:  #1.  Acute cholangitis.  She is status post ERCP with sphincterotomy and stone extraction 2 days ago.  He underwent cholecystectomy yesterday.  He is doing much better.  Cultures positive for gram-negative organisms.  He is presently on cefotetan.  He was on Zosyn before.  He is completing day 4 on IV antibiotic.  Continued improvement in transaminases. He may need antibiotic for 1-3 more days depending on clinical course.  #2.  Leukocytosis.  WBC was 22.83 days ago and was down to 10.9 yesterday.  It is back up to 20.0.  He does not have diarrhea to think of C. difficile.  This change  may be postsurgical.  CBC to be repeated in a.m.  #3.  All the elevated troponin level.  No change postop.  Echocardiogram done.  No significant abnormality but official report is pending.  #4.  Mild anemia secondary to acute illness.   Recommendations:  Advance diet to heart healthy diet. CBC in a.m.

## 2018-05-15 ENCOUNTER — Encounter (HOSPITAL_COMMUNITY): Payer: Self-pay | Admitting: Internal Medicine

## 2018-05-15 DIAGNOSIS — K8309 Other cholangitis: Secondary | ICD-10-CM

## 2018-05-15 DIAGNOSIS — I10 Essential (primary) hypertension: Secondary | ICD-10-CM

## 2018-05-15 DIAGNOSIS — A4151 Sepsis due to Escherichia coli [E. coli]: Principal | ICD-10-CM

## 2018-05-15 DIAGNOSIS — R945 Abnormal results of liver function studies: Secondary | ICD-10-CM

## 2018-05-15 DIAGNOSIS — R748 Abnormal levels of other serum enzymes: Secondary | ICD-10-CM

## 2018-05-15 DIAGNOSIS — K8050 Calculus of bile duct without cholangitis or cholecystitis without obstruction: Secondary | ICD-10-CM

## 2018-05-15 LAB — CBC
HEMATOCRIT: 31.7 % — AB (ref 39.0–52.0)
Hemoglobin: 10.5 g/dL — ABNORMAL LOW (ref 13.0–17.0)
MCH: 33.5 pg (ref 26.0–34.0)
MCHC: 33.1 g/dL (ref 30.0–36.0)
MCV: 101.3 fL — ABNORMAL HIGH (ref 78.0–100.0)
PLATELETS: 188 10*3/uL (ref 150–400)
RBC: 3.13 MIL/uL — ABNORMAL LOW (ref 4.22–5.81)
RDW: 13.3 % (ref 11.5–15.5)
WBC: 18.3 10*3/uL — ABNORMAL HIGH (ref 4.0–10.5)

## 2018-05-15 LAB — BASIC METABOLIC PANEL
Anion gap: 9 (ref 5–15)
BUN: 15 mg/dL (ref 6–20)
CALCIUM: 7.9 mg/dL — AB (ref 8.9–10.3)
CO2: 27 mmol/L (ref 22–32)
CREATININE: 1.62 mg/dL — AB (ref 0.61–1.24)
Chloride: 101 mmol/L (ref 101–111)
GFR, EST AFRICAN AMERICAN: 42 mL/min — AB (ref >60)
GFR, EST NON AFRICAN AMERICAN: 36 mL/min — AB (ref >60)
GLUCOSE: 136 mg/dL — AB (ref 65–99)
Potassium: 3.1 mmol/L — ABNORMAL LOW (ref 3.5–5.1)
Sodium: 137 mmol/L (ref 135–145)

## 2018-05-15 LAB — CULTURE, BLOOD (ROUTINE X 2): Special Requests: ADEQUATE

## 2018-05-15 MED ORDER — DOCUSATE SODIUM 100 MG PO CAPS
100.0000 mg | ORAL_CAPSULE | Freq: Two times a day (BID) | ORAL | Status: DC
  Administered 2018-05-15 – 2018-05-16 (×2): 100 mg via ORAL
  Filled 2018-05-15 (×3): qty 1

## 2018-05-15 MED ORDER — BISACODYL 10 MG RE SUPP: 10.0000 mg | Freq: Every day | RECTAL | Status: DC | PRN

## 2018-05-15 NOTE — Progress Notes (Signed)
[facility] Progress Note  2 Days Post-Op  Subjective: No major issues. Moved from the ICU to floor. No complaints. Says he is a little dizzy but no cardiac issues noted. Has been taking in some diet but mostly just thirsty. No nausea/vomiting, no flatus.   Objective: Vital signs in last 24 hours: Temp:  [97.7 F (36.5 C)-98.5 F (36.9 C)] 98.3 F (36.8 C) (06/12 [id number]) Pulse Rate:  [40-89] 68 (06/12 0633) Resp:  [14-23] 20 (06/12 0633) BP: (118-140)/(58-70) 140/62 (06/12 0633) SpO2:  [87 %-92 %] 90 % (06/12 0633)    Intake/Output from previous day: 06/11 0701 - 06/12 0700 In: 506.3 [P.O.:340; I.V.:150; IV Piggyback:16.3] Out: 400 [Urine:400] Intake/Output this shift: No intake/output data recorded.  General appearance: alert, cooperative and no distress Resp: normal work breathing GI: soft, mildly distended, minimally tender, port sites c/d/i with dermabond, no erythema or drainage  Lab Results:  Recent Labs    05/14/18 0515 05/15/18 0543  WBC 20.0* 18.3*  HGB 11.3* 10.5*  HCT 35.1* 31.7*  PLT 230 188   BMET Recent Labs    05/14/18 0515 05/15/18 0543  NA 138 137  K 3.7 3.1*  CL 103 101  CO2 26 27  GLUCOSE 172* 136*  BUN 17 15  CREATININE 1.52* 1.62*  CALCIUM 8.1* 7.9*   Anti-infectives: Anti-infectives (From admission, onward)   Start     Dose/Rate Route Frequency Ordered Stop   05/14/18 1400  piperacillin-tazobactam (ZOSYN) IVPB 3.375 g     3.375 g 12.5 mL/hr over 240 Minutes Intravenous Every 8 hours 05/14/18 1303     05/14/18 1045  ciprofloxacin (CIPRO) tablet 500 mg  Status:  Discontinued     500 mg Oral 2 times daily 05/14/18 1045 05/14/18 1050   05/13/18 0600  cefoTEtan (CEFOTAN) 2 g in sodium chloride 0.9 % 100 mL IVPB     2 g 200 mL/hr over 30 Minutes Intravenous On call to O.R. 05/12/18 1135 05/13/18 1237   05/10/18 2200  piperacillin-tazobactam (ZOSYN) IVPB 3.375 g  Status:  Discontinued     3.375 g 12.5 mL/hr over 240  Minutes Intravenous Every 8 hours 05/10/18 1850 05/14/18 1045   05/10/18 1430  vancomycin (VANCOCIN) 1,500 mg in sodium chloride 0.9 % 500 mL IVPB     1,500 mg 250 mL/hr over 120 Minutes Intravenous  Once 05/10/18 1403 05/10/18 1735   05/10/18 1400  piperacillin-tazobactam (ZOSYN) IVPB 3.375 g     3.375 g 100 mL/hr over 30 Minutes Intravenous  Once 05/10/18 1350 05/10/18 1501   05/10/18 1400  vancomycin (VANCOCIN) IVPB 1000 mg/200 mL premix  Status:  Discontinued     1,000 mg 200 mL/hr over 60 Minutes Intravenous  Once 05/10/18 1350 05/10/18 1403      Assessment/Plan: [name] is a 84 yo POD 3 s/p ERCP and POD 2 s/p Lap chole for choledocholithiasis and cholangitis. Well overall.  PRN For pain, requiring minimal OOB and ambulated yesterday, RN says will get in the halls today Bacteremia with E coli, defer to Dr. Gwenlyn PerkingMadera regarding plans for antibiotics, WBC going down post op  Diet as tolerated, staying hydrated most important thing, food will come Added colace BID and dulcolax PRN for BM   If goes home, will see in the clinic in about 2 weeks. Will update Dr.Madera.    LOS: 5 days    Lucretia RoersLindsay C Nashonda Limberg 05/15/2018

## 2018-05-15 NOTE — Care Management Important Message (Signed)
Important Message  Patient Details  Name: Robert Cardenas MRN: [id number] Date of Birth: 11/17/1926   Medicare Important Message Given:  Yes    Renie OraHawkins, Meri Pelot Rottenberg 05/15/2018, 11:52 AM

## 2018-05-15 NOTE — Progress Notes (Signed)
PROGRESS NOTE    Robert Cardenas  ZOX:[id number] DOB: 1926/10/06 DOA: 05/10/2018 PCP: Carylon Perches, MD   Brief Narrative:   Robert Cardenas is a 84 y.o. male with a history of hypertension, gout, prostate cancer status post brachytherapy.  Patient brought to the emergency department by EMS due to being found with a fever, altered mental status, and an episode of vomiting.  Patient was seen passed out in his car.    He has had some intermittent left shoulder pain as well as right upper quadrant abdominal pain in the last several days.  He has been admitted with suspicion of a sending cholangitis secondary to choledocholithiasis as noted on imaging studies.  He has been improving on IV Zosyn with noted E. coli bacteremia.  He had developed bradycardia overnight on 6/9 and was noted to have nonconducted PACs.  He has been seen by cardiology and is approved for laparoscopic cholecystectomy and underwent the procedure on 6/11 without complication.  He is doing much better this morning and does not seem to have any further arrhythmias on telemetry.  Atenolol continues to be withheld and he continues to remain on Zosyn with improvement in symptoms.   Assessment & Plan:   Principal Problem:   Sepsis (HCC) Active Problems:   HTN (hypertension)   Cholelithiasis   Elevated LFTs   Elevated troponin   Choledocholithiasis   Cardiac arrhythmia   1. Sepsis secondary to likely ascending cholangitis with choledocholithiasis-improving.  He is s/p ERCP and cholecystectomy which was performed on 6/10.  Much improved and remains on Zosyn.  See bacteremia recommendations below.  Continue diet advancement per general surgery recommendations. No acute distress or major complaints. Encourage to increase diet. 2. Asymptomatic bradycardia.  Likely secondary to intermittent nonconducted PACs versus Mobitz 2.  Amlodipine is continued.  Appreciate cardiology recommendations.  Will continue to monitor. Will continue holding  atenolol and will discontinue it at discharge. 3. E.Coli bacteremia secondary to above. Continue IV Zosyn for now. Would not convert to oral as he is close to finishing treatment regimen.  Recommended to have 7-day course as he is clinically improved.  He is currently day 6 of 7 and can likely discharge once this course is completed. Patient afebrile and non toxic. 4. Elevated troponins.  Likely secondary to demand ischemia from above.  Trend has remained flat with no suspicion of ACS. Patient denies CP and SOB. No ischemic work up needed. 5. Transaminitis.  Continue to trend due to above process.  Total bilirubin is downtrending appropriately. Repeat CMET in am.  6. Mild hypokalemia-will follow electrolytes it and replete as needed.  7. Hypertension.  Well-controlled, will continue amlodipine.    DVT prophylaxis: SCDs Code Status: Full Family Communication: None at bedside Disposition Plan: Continue diet advancement as tolerated.  Most likely home in am. Continue IV Zosyn for 1 more days to finish a 7-day course regimen for E. coli bacteremia with good clinical response.   Consultants:   GI and GS  Procedures:   None  Antimicrobials:   Zosyn 6/7->6/13   Subjective: Afebrile, feeling better overall. No CP, no SOB. Reports no nausea and no vomiting; appetite is poor and patient still with mild RUQ discomfort.  Objective: Vitals:   05/14/18 2125 05/15/18 0633 05/15/18 1358 05/15/18 2058  BP: 133/70 140/62 127/60 (!) 141/67  Pulse: 71 68 (!) 52 63  Resp: 20 20 18 18   Temp: 98.5 F (36.9 C) 98.3 F (36.8 C) 99.4 F (37.4 C) 98.5  F (36.9 C)  TempSrc: Oral Oral Oral Oral  SpO2: 92% 90% 91% (!) 89%  Weight:      Height:        Intake/Output Summary (Last 24 hours) at 05/15/2018 2231 Last data filed at 05/15/2018 1819 Gross per 24 hour  Intake 743.13 ml  Output 600 ml  Net 143.13 ml   Filed Weights   05/10/18 1925 05/13/18 0427 05/14/18 0500  Weight: 79.4 kg (175 lb 0.7  oz) 75.1 kg (165 lb 9.1 oz) 75.1 kg (165 lb 9.1 oz)    Examination:  General exam: Alert, awake, oriented x 3 Respiratory system: Clear to auscultation. Respiratory effort normal. No using accessory muscles or requiring O2 supplementation. Cardiovascular system:RRR. No murmurs, rubs, gallops. Gastrointestinal system: Abdomen is nondistended, soft and just mild tender in RUQ (with deep palpation and certain movements) Central nervous system: Alert and oriented. No focal neurological deficits. CN intact. Extremities: No C/C/E, +pedal pulses Skin: No rashes or petechiae; marks in abdomen from recent surgery; non infected and otherwise healing as intended.  Psychiatry: Judgement and insight appear normal. Mood & affect appropriate.   Data Reviewed: I have personally reviewed following labs and imaging studies  CBC: Recent Labs  Lab 05/10/18 1408 05/11/18 0623 05/12/18 0631 05/13/18 0331 05/14/18 0515 05/15/18 0543  WBC 20.1* 22.8* 15.4* 10.9* 20.0* 18.3*  NEUTROABS 19.8*  --   --   --   --   --   HGB 12.6* 10.5* 11.2* 10.9* 11.3* 10.5*  HCT 38.6* 32.2* 34.7* 33.9* 35.1* 31.7*  MCV 101.3* 102.2* 101.5* 101.5* 102.3* 101.3*  PLT 267 212 226 213 230 188   Basic Metabolic Panel: Recent Labs  Lab 05/11/18 0623 05/12/18 0631 05/13/18 0331 05/14/18 0515 05/15/18 0543  NA 140 140 140 138 137  K 3.4* 3.6 3.8 3.7 3.1*  CL 109 108 106 103 101  CO2 25 23 26 26 27   GLUCOSE 105* 128* 101* 172* 136*  BUN 23* 20 15 17 15   CREATININE 1.51* 1.53* 1.41* 1.52* 1.62*  CALCIUM 7.8* 8.3* 8.1* 8.1* 7.9*  MG  --  1.9 2.0  --   --    GFR: Estimated Creatinine Clearance: 30.9 mL/min (A) (by C-G formula based on SCr of 1.62 mg/dL (H)).   Liver Function Tests: Recent Labs  Lab 05/10/18 1408 05/11/18 0623 05/12/18 0631 05/13/18 0331 05/14/18 0515  AST 227* 103* 65* 41 61*  ALT 132* 82* 69* 52 56  ALKPHOS 251* 140* 142* 138* 124  BILITOT 4.5* 4.1* 3.4* 2.9* 2.6*  PROT 6.8 5.3* 5.8*  5.8* 5.8*  ALBUMIN 3.5 2.7* 2.9* 2.9* 2.8*   Recent Labs  Lab 05/10/18 1556 05/13/18 0331  LIPASE 21  --   AMYLASE  --  28   Coagulation Profile: Recent Labs  Lab 05/12/18 0631  INR 1.18   Cardiac Enzymes: Recent Labs  Lab 05/10/18 2331 05/11/18 0623 05/13/18 0331 05/13/18 0909 05/13/18 1543  TROPONINI 0.07* 0.06* 0.03* 0.03* <0.03   Sepsis Labs: Recent Labs  Lab 05/10/18 1408 05/10/18 1556 05/10/18 2332  LATICACIDVEN 7.1* 4.7* 1.3    Recent Results (from the past 240 hour(s))  Blood Culture (routine x 2)     Status: Abnormal   Collection Time: 05/10/18  2:08 PM  Result Value Ref Range Status   Specimen Description   Final    RIGHT ANTECUBITAL BOTTLES DRAWN AEROBIC AND ANAEROBIC Performed at [facility], [address], [place], [place] [address]    Special Requests  Final    Blood Culture adequate volume Performed at [facility], [address], [place], [place] [address]    Culture  Setup Time   Final    GRAM NEGATIVE RODS AEROBIC BOTTLE ONLY Gram Stain Report Called to,Read Back By and Verified With: DUNN @ 0706 ON [id number] BY HENDERSON L. CRITICAL RESULT CALLED TO, READ BACK BY AND VERIFIED WITH: L POOLE,PHARMD AT 1310 05/12/18 BY L BENFIELD Performed at [facility], [address], [place], [place] [address]    Culture ESCHERICHIA COLI (A)  Final   Report Status 05/14/2018 FINAL  Final   Organism ID, Bacteria ESCHERICHIA COLI  Final      Susceptibility   Escherichia coli - MIC*    AMPICILLIN <=2 SENSITIVE Sensitive     CEFAZOLIN <=4 SENSITIVE Sensitive     CEFEPIME <=1 SENSITIVE Sensitive     CEFTAZIDIME <=1 SENSITIVE Sensitive     CEFTRIAXONE <=1 SENSITIVE Sensitive     CIPROFLOXACIN <=0.25 SENSITIVE Sensitive     GENTAMICIN <=1 SENSITIVE Sensitive     IMIPENEM <=0.25 SENSITIVE Sensitive     TRIMETH/SULFA <=20 SENSITIVE Sensitive     AMPICILLIN/SULBACTAM <=2 SENSITIVE Sensitive     PIP/TAZO <=4 SENSITIVE Sensitive     Extended ESBL  NEGATIVE Sensitive     * ESCHERICHIA COLI  Blood Culture ID Panel (Reflexed)     Status: Abnormal   Collection Time: 05/10/18  2:08 PM  Result Value Ref Range Status   Enterococcus species NOT DETECTED NOT DETECTED Final   Listeria monocytogenes NOT DETECTED NOT DETECTED Final   Staphylococcus species NOT DETECTED NOT DETECTED Final   Staphylococcus aureus NOT DETECTED NOT DETECTED Final   Streptococcus species NOT DETECTED NOT DETECTED Final   Streptococcus agalactiae NOT DETECTED NOT DETECTED Final   Streptococcus pneumoniae NOT DETECTED NOT DETECTED Final   Streptococcus pyogenes NOT DETECTED NOT DETECTED Final   Acinetobacter baumannii NOT DETECTED NOT DETECTED Final   Enterobacteriaceae species DETECTED (A) NOT DETECTED Final    Comment: Enterobacteriaceae represent a large family of gram-negative bacteria, not a single organism. CRITICAL RESULT CALLED TO, READ BACK BY AND VERIFIED WITH: L POOLE,PHARMD AT 1310 05/12/18 BY L BENFIELD    Enterobacter cloacae complex NOT DETECTED NOT DETECTED Final   Escherichia coli DETECTED (A) NOT DETECTED Final    Comment: CRITICAL RESULT CALLED TO, READ BACK BY AND VERIFIED WITH: L POOLE,PHARMD AT 1310 05/12/18 BY L BENFIELD    Klebsiella oxytoca NOT DETECTED NOT DETECTED Final   Klebsiella pneumoniae NOT DETECTED NOT DETECTED Final   Proteus species NOT DETECTED NOT DETECTED Final   Serratia marcescens NOT DETECTED NOT DETECTED Final   Carbapenem resistance NOT DETECTED NOT DETECTED Final   Haemophilus influenzae NOT DETECTED NOT DETECTED Final   Neisseria meningitidis NOT DETECTED NOT DETECTED Final   Pseudomonas aeruginosa NOT DETECTED NOT DETECTED Final   Candida albicans NOT DETECTED NOT DETECTED Final   Candida glabrata NOT DETECTED NOT DETECTED Final   Candida krusei NOT DETECTED NOT DETECTED Final   Candida parapsilosis NOT DETECTED NOT DETECTED Final   Candida tropicalis NOT DETECTED NOT DETECTED Final    Comment: Performed at [facility], [address] [place], [place] [address]  Blood Culture (routine x 2)     Status: Abnormal   Collection Time: 05/10/18  2:10 PM  Result Value Ref Range Status   Specimen Description   Final    BLOOD LEFT HAND Performed at [facility],  [address] [place], [place] [address]    Special Requests   Final    BOTTLES DRAWN AEROBIC AND ANAEROBIC Blood Culture adequate volume Performed at [facility], [address] [place], [place] [address]    Culture  Setup Time   Final    GRAM NEGATIVE RODS Gram Stain Report Called to,Read Back By and Verified With: HEARN,J @ 0510 ON 05/13/18 BY JUW GS DONE @ [facility] AEROBIC BOTTLE ONLY CRITICAL VALUE NOTED.  VALUE IS CONSISTENT WITH PREVIOUSLY REPORTED AND CALLED VALUE. Performed at [facility], [address] [place], [place] [address]    Culture (A)  Final    ESCHERICHIA COLI SUSCEPTIBILITIES PERFORMED ON PREVIOUS CULTURE WITHIN THE LAST 5 DAYS.    Report Status 05/15/2018 FINAL  Final  MRSA PCR Screening     Status: None   Collection Time: 05/13/18  4:17 AM  Result Value Ref Range Status   MRSA by PCR NEGATIVE NEGATIVE Final    Comment:        The GeneXpert MRSA Assay (FDA approved for NASAL specimens only), is one component of a comprehensive MRSA colonization surveillance program. It is not intended to diagnose MRSA infection nor to guide or monitor treatment for MRSA infections. Performed at [facility], [address] [place], [place] [address]       Radiology Studies: No results found.   Scheduled Meds: . amLODipine  5 mg Oral Daily  . docusate sodium  100 mg Oral BID   Continuous Infusions: . piperacillin-tazobactam (ZOSYN)  IV Stopped (05/15/18 2219)     LOS: 5 days    Time spent: 30 minutes    Vassie Lollarlos Verle Brillhart, MD [facility] Pager [phone]  If 7PM-7AM, please contact night-coverage [email or web address] Password [email or web address] 05/15/2018, 10:31 PM

## 2018-05-15 NOTE — Discharge Instructions (Signed)
Discharge Instructions: Shower per your regular routine. Take tylenol and ibuprofen as needed for pain control, alternating every 4-6 hours.  Take Roxicodone for breakthrough pain. Take colace for constipation related to narcotic pain medication. Suppository as needed if no BMs. Do not pick at the dermabond glue on your incision sites.  Diet as tolerated, staying hydrated his key and food will start to become more appetizing in the upcoming days/ weeks.  Boost/ Ensure good for supplementation if not eating a lot.   Laparoscopic Cholecystectomy, Care After This sheet gives you information about how to care for yourself after your procedure. Your doctor may also give you more specific instructions. If you have problems or questions, contact your doctor. Follow these instructions at home: Care for cuts from surgery (incisions)   Follow instructions from your doctor about how to take care of your cuts from surgery. Make sure you: ? Wash your hands with soap and water before you change your bandage (dressing). If you cannot use soap and water, use hand sanitizer. ? Change your bandage as told by your doctor. ? Leave stitches (sutures), skin glue, or skin tape (adhesive) strips in place. They may need to stay in place for 2 weeks or longer. If tape strips get loose and curl up, you may trim the loose edges. Do not remove tape strips completely unless your doctor says it is okay.  Do not take baths, swim, or use a hot tub until your doctor says it is okay.   Check your surgical cut area every day for signs of infection. Check for: ? More redness, swelling, or pain. ? More fluid or blood. ? Warmth. ? Pus or a bad smell. Activity  Do not drive or use heavy machinery while taking prescription pain medicine.  Do not lift anything that is heavier than 10 lb (4.5 kg) for about 1 week.  Do not play contact sports until your doctor says it is okay.  Do not drive for 24 hours if you were given a  medicine to help you relax (sedative).  Rest as needed. Do not return to work or school until your doctor says it is okay. General instructions  Take over-the-counter and prescription medicines only as told by your doctor.  To prevent or treat constipation while you are taking prescription pain medicine, your doctor may recommend that you: ? Drink enough fluid to keep your pee (urine) clear or pale yellow. ? Take over-the-counter or prescription medicines. ? Eat foods that are high in fiber, such as fresh fruits and vegetables, whole grains, and beans. ? Limit foods that are high in fat and processed sugars, such as fried and sweet foods. Contact a doctor if:  You develop a rash.  You have more redness, swelling, or pain around your surgical cuts.  You have more fluid or blood coming from your surgical cuts.  Your surgical cuts feel warm to the touch.  You have pus or a bad smell coming from your surgical cuts.  You have a fever.  One or more of your surgical cuts breaks open. Get help right away if:  You have trouble breathing.  You have chest pain.  You have pain that is getting worse in your shoulders.  You faint or feel dizzy when you stand.  You have very bad pain in your belly (abdomen).  You are sick to your stomach (nauseous) for more than one day.  You have throwing up (vomiting) that lasts for more than one day.  You have leg pain. This information is not intended to replace advice given to you by your health care provider. Make sure you discuss any questions you have with your health care provider. Document Released: 08/29/2008 Document Revised: 06/10/2016 Document Reviewed: 05/08/2016 Elsevier Interactive Patient Education  2018 [employer]   Endoscopic Retrograde Cholangiopancreatogram, Care After This sheet gives you information about how to care for yourself after your procedure. Your health care provider may also give you more specific  instructions. If you have problems or questions, contact your health care provider. What can I expect after the procedure? After the procedure, it is common to have:  Soreness in your throat.  Nausea.  Bloating.  Dizziness.  Tiredness (fatigue).  Follow these instructions at home:  Take over-the-counter and prescription medicines only as told by your health care provider.  Do not drive for 24 hours if you were given a medicine to help you relax (sedative) during your procedure. Have someone stay with you for 24 hours after the procedure.  Return to your normal activities as told by your health care provider. Ask your health care provider what activities are safe for you.  Return to eating what you normally do as soon as you feel well enough or as told by your health care provider.  Keep all follow-up visits as told by your health care provider. This is important. Contact a health care provider if:  You have pain in your abdomen that does not get better with medicine.  You develop signs of infection, such as: ? Chills. ? Feeling unwell. Get help right away if:  You have difficulty swallowing.  You have worsening pain in your throat, chest, or abdomen.  You vomit bright red blood or a substance that looks like coffee grounds.  You have bloody or very black stools.  You have a fever.  You have a sudden increase in swelling (bloating) in your abdomen. Summary  After the procedure, it is common to feel tired and to have some discomfort in your throat.  Contact your health care provider if you have signs of infection--such as chills or feeling unwell--or if you have pain that does not improve with medicine.  Get help right away if you have trouble swallowing, worsening pain, bloody or black vomit, bloody or black stools, a fever, or increased swelling in your abdomen.  Keep all follow-up visits as told by your health care provider. This is important. This information  is not intended to replace advice given to you by your health care provider. Make sure you discuss any questions you have with your health care provider. Document Released: 09/10/2013 Document Revised: 10/09/2016 Document Reviewed: 10/09/2016 Elsevier Interactive Patient Education  2017 [employer]

## 2018-05-15 NOTE — Progress Notes (Signed)
  Subjective:  Patient states he does not have a good appetite.  However he does not have nausea vomiting or diarrhea.  He has mild pain below the right costal margin only when he moves.  Objective: Blood pressure 127/60, pulse (!) 52, temperature 99.4 F (37.4 C), temperature source Oral, resp. rate 18, height 5\' 9"  (1.753 m), weight 165 lb 9.1 oz (75.1 kg), SpO2 91 %. Patient is alert. He is sitting at edge of bed. Abdominal exam reveals full abdomen with normal bowel sounds.  He has mild tenderness around laparoscopy wound sites.  No masses.  Labs/studies Results:   Recent Labs    05/13/18 0331 05/14/18 0515 05/15/18 0543  WBC 10.9* 20.0* 18.3*  HGB 10.9* 11.3* 10.5*  HCT 33.9* 35.1* 31.7*  PLT 213 230 188    BMET   Recent Labs    05/13/18 0331 05/14/18 0515 05/15/18 0543  NA 140 138 137  K 3.8 3.7 3.1*  CL 106 103 101  CO2 26 26 27   GLUCOSE 101* 172* 136*  BUN 15 17 15   CREATININE 1.41* 1.52* 1.62*  CALCIUM 8.1* 8.1* 7.9*    LFT   Recent Labs    05/13/18 0331 05/14/18 0515  PROT 5.8* 5.8*  ALBUMIN 2.9* 2.8*  AST 41 61*  ALT 52 56  ALKPHOS 138* 124  BILITOT 2.9* 2.6*    PT/INR  No results for input(s): LABPROT, INR in the last 72 hours.   Assessment:  #1.  Cholangitis secondary to gram-negative organisms.  Patient is status post therapeutic ERCP 3 days ago and cholecystectomy 2 days ago.  Day 5 on Zosyn.  Overall he feels much better.  He is not obstructed and he may not need any more antibiotics when he is discharged.  #2.  Leukocytosis.  He had leukocytosis on admission but it dropped to almost normal on biliary decompression.  He had bump following cholecystectomy.  WBC is coming down.  He does not appear to be septic.  He also does not have any symptoms to suggest C. difficile colitis.  #2.  Cardiology note appreciated.  Echo revealed grade 2 diastolic dysfunction but no regional wall motion abnormality.  He has mild to moderate MR and  TR.   Recommendations:  CBC and LFTs in a.m.

## 2018-05-15 NOTE — Progress Notes (Addendum)
    Telemetry reviewed. Has maintained NSR, HR in the 60's to 70's, with occasional PVC's and episodes of sinus arrhythmia but no documented episodes of atrial fibrillation or significant bradycardia. Would continue to hold Atenolol. Echo reviewed which shows a preserved EF of 60-65% with Grade 2 DD and no regional WMA. Does have mild to moderate MR and TR which can be followed as an outpatient. No new Cardiology recommendations at this time.   Signed, Ellsworth LennoxBrittany M Strader, PA-C 05/15/2018, 7:46 AM Pager: [phone]  Telemetry reviewed. Agree with above.

## 2018-05-16 DIAGNOSIS — A4151 Sepsis due to Escherichia coli [E. coli]: Secondary | ICD-10-CM

## 2018-05-16 DIAGNOSIS — K8021 Calculus of gallbladder without cholecystitis with obstruction: Secondary | ICD-10-CM

## 2018-05-16 LAB — CBC
HCT: 32.1 % — ABNORMAL LOW (ref 39.0–52.0)
HEMOGLOBIN: 10.7 g/dL — AB (ref 13.0–17.0)
MCH: 33.6 pg (ref 26.0–34.0)
MCHC: 33.3 g/dL (ref 30.0–36.0)
MCV: 100.9 fL — ABNORMAL HIGH (ref 78.0–100.0)
PLATELETS: 234 10*3/uL (ref 150–400)
RBC: 3.18 MIL/uL — AB (ref 4.22–5.81)
RDW: 14 % (ref 11.5–15.5)
WBC: 15.6 10*3/uL — AB (ref 4.0–10.5)

## 2018-05-16 LAB — HEPATIC FUNCTION PANEL
ALBUMIN: 2.4 g/dL — AB (ref 3.5–5.0)
ALK PHOS: 95 U/L (ref 38–126)
ALT: 30 U/L (ref 17–63)
AST: 23 U/L (ref 15–41)
BILIRUBIN INDIRECT: 1.4 mg/dL — AB (ref 0.3–0.9)
Bilirubin, Direct: 0.6 mg/dL — ABNORMAL HIGH (ref 0.1–0.5)
TOTAL PROTEIN: 5.4 g/dL — AB (ref 6.5–8.1)
Total Bilirubin: 2 mg/dL — ABNORMAL HIGH (ref 0.3–1.2)

## 2018-05-16 MED ORDER — OXYCODONE HCL 5 MG PO TABS: 5.0000 mg | ORAL_TABLET | Freq: Four times a day (QID) | ORAL | 0 refills | Status: AC | PRN

## 2018-05-16 NOTE — Progress Notes (Signed)
[facility]  Patient looking good. Sitting up eating breakfast. Says feels good but has some soreness. Overall labs improving, WBC down to 15 and T bili 2.  Will see in the clinic in a few weeks.   Algis GreenhouseLindsay Marjoria Mancillas, MD [facility] [address] [place], [place] [id number] [phone] (office)

## 2018-05-16 NOTE — Progress Notes (Signed)
Pt discharged home today per Dr. Madera. Pt's IV site D/C'd and WDL. Pt's VSS. Pt provided with home medication list, discharge instructions and prescriptions. Verbalized understanding. Pt left floor via WC in stable condition accompanied by RN. 

## 2018-05-16 NOTE — Care Management Note (Signed)
Case Management Note  Patient Details  Name: Robert Cardenas MRN: [id number] Date of Birth: 09/10/1926  Expected Discharge Date:    05/16/18              Expected Discharge Plan:  Home/Self Care  Discharge planning Services  CM Consult  Post Acute Care Choice:  NA Choice offered to:  NA  Status of Service:  Completed, signed off  If discussed at Long Length of Stay Meetings, dates discussed:  05/16/18  Additional Comments: DC home today, no CM needs noted at this time.   Malcolm Metrohildress, Yuchen Fedor Demske, RN 05/16/2018, 1:14 PM
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
## 2018-05-30 DIAGNOSIS — R000 Tachycardia, unspecified: Secondary | ICD-10-CM | POA: Diagnosis not present

## 2018-05-30 DIAGNOSIS — R001 Bradycardia, unspecified: Secondary | ICD-10-CM | POA: Diagnosis not present

## 2018-05-30 DIAGNOSIS — A419 Sepsis, unspecified organism: Secondary | ICD-10-CM | POA: Diagnosis not present

## 2018-05-30 DIAGNOSIS — I700 Atherosclerosis of aorta: Secondary | ICD-10-CM | POA: Diagnosis not present

## 2018-06-04 ENCOUNTER — Encounter: Payer: Self-pay | Admitting: General Surgery

## 2018-06-04 ENCOUNTER — Ambulatory Visit (INDEPENDENT_AMBULATORY_CARE_PROVIDER_SITE_OTHER): Payer: Self-pay | Admitting: General Surgery

## 2018-06-04 VITALS — BP 151/66 | HR 99 | Temp 97.7°F | Resp 20 | Wt 159.0 lb

## 2018-06-04 DIAGNOSIS — K8050 Calculus of bile duct without cholangitis or cholecystitis without obstruction: Secondary | ICD-10-CM

## 2018-06-04 NOTE — Progress Notes (Signed)
[facility] Note   HPI:  84 y.o. Male presents to clinic for post-op follow-up evaluation after a laparoscopic cholecystectomy. Patient reports he is doing well. He feels well. He is eating and drinking and having regular Bms. He has worked in his garden. The back pain and left shoulder pain he had prior to surgery are all gone.   Review of Systems:  No fevers or chills Pain resolved  All other review of systems: otherwise negative   Vital Signs:  BP (!) 151/66 (BP Location: Right Arm, Patient Position: Sitting, Cuff Size: Normal)   Pulse 99   Temp 97.7 F (36.5 C) (Temporal)   Resp 20   Wt 159 lb (72.1 kg)   BMI 23.48 kg/m    Pathology: Diagnosis Gallbladder - CHRONIC CHOLECYSTITIS AND CHOLELITHIASIS.  Physical Exam:  Physical Exam  Constitutional: He appears well-developed.  Eyes: Pupils are equal, round, and reactive to light.  Neck: Normal range of motion.  Cardiovascular: Normal rate.  Pulmonary/Chest: Effort normal.  Abdominal: Soft. He exhibits no distension. There is no tenderness.  Port site c/d/i with peeling dermabond; epigastric extraction site healing without signs of hernia, at the rib cage border  Vitals reviewed.   Laboratory studies: None   Imaging:  None    Assessment:  84 y.o. yo Male s/p laparoscopic cholecystectomy for choledocholithiasis and cholecystitis. Doing well.   Plan:  - Activity as tolerated   - Follow up PRN    All of the above recommendations were discussed with the patient, and all of patient's questions were answered to his expressed satisfaction.  Algis GreenhouseLindsay Bridges, MD [facility] [address] [place], [place] [id number] [phone] (office)

## 2018-06-13 DIAGNOSIS — I10 Essential (primary) hypertension: Secondary | ICD-10-CM | POA: Diagnosis not present

## 2018-06-13 DIAGNOSIS — M109 Gout, unspecified: Secondary | ICD-10-CM | POA: Diagnosis not present

## 2018-06-13 DIAGNOSIS — E119 Type 2 diabetes mellitus without complications: Secondary | ICD-10-CM | POA: Diagnosis not present

## 2018-06-13 DIAGNOSIS — Z79899 Other long term (current) drug therapy: Secondary | ICD-10-CM | POA: Diagnosis not present

## 2018-06-17 DIAGNOSIS — I10 Essential (primary) hypertension: Secondary | ICD-10-CM | POA: Diagnosis not present

## 2018-06-17 DIAGNOSIS — R7309 Other abnormal glucose: Secondary | ICD-10-CM | POA: Diagnosis not present

## 2018-06-17 DIAGNOSIS — I129 Hypertensive chronic kidney disease with stage 1 through stage 4 chronic kidney disease, or unspecified chronic kidney disease: Secondary | ICD-10-CM | POA: Diagnosis not present

## 2018-06-17 DIAGNOSIS — E1129 Type 2 diabetes mellitus with other diabetic kidney complication: Secondary | ICD-10-CM | POA: Diagnosis not present

## 2018-07-10 ENCOUNTER — Encounter (HOSPITAL_COMMUNITY): Payer: Self-pay | Admitting: Internal Medicine

## 2018-09-27 DIAGNOSIS — E875 Hyperkalemia: Secondary | ICD-10-CM | POA: Diagnosis not present

## 2018-09-27 DIAGNOSIS — N184 Chronic kidney disease, stage 4 (severe): Secondary | ICD-10-CM | POA: Diagnosis not present

## 2018-09-27 DIAGNOSIS — M109 Gout, unspecified: Secondary | ICD-10-CM | POA: Diagnosis not present

## 2018-09-27 DIAGNOSIS — I10 Essential (primary) hypertension: Secondary | ICD-10-CM | POA: Diagnosis not present

## 2018-10-04 DIAGNOSIS — Z23 Encounter for immunization: Secondary | ICD-10-CM | POA: Diagnosis not present

## 2018-10-07 DIAGNOSIS — E1122 Type 2 diabetes mellitus with diabetic chronic kidney disease: Secondary | ICD-10-CM | POA: Diagnosis not present

## 2018-10-07 DIAGNOSIS — N183 Chronic kidney disease, stage 3 (moderate): Secondary | ICD-10-CM | POA: Diagnosis not present

## 2018-10-25 DIAGNOSIS — Z961 Presence of intraocular lens: Secondary | ICD-10-CM | POA: Diagnosis not present

## 2018-10-25 DIAGNOSIS — H524 Presbyopia: Secondary | ICD-10-CM | POA: Diagnosis not present

## 2018-10-25 DIAGNOSIS — H52203 Unspecified astigmatism, bilateral: Secondary | ICD-10-CM | POA: Diagnosis not present

## 2018-10-25 DIAGNOSIS — H353131 Nonexudative age-related macular degeneration, bilateral, early dry stage: Secondary | ICD-10-CM | POA: Diagnosis not present

## 2019-01-06 DIAGNOSIS — E1129 Type 2 diabetes mellitus with other diabetic kidney complication: Secondary | ICD-10-CM | POA: Diagnosis not present

## 2019-01-06 DIAGNOSIS — N183 Chronic kidney disease, stage 3 (moderate): Secondary | ICD-10-CM | POA: Diagnosis not present

## 2019-01-13 DIAGNOSIS — I129 Hypertensive chronic kidney disease with stage 1 through stage 4 chronic kidney disease, or unspecified chronic kidney disease: Secondary | ICD-10-CM | POA: Diagnosis not present

## 2019-01-13 DIAGNOSIS — N183 Chronic kidney disease, stage 3 (moderate): Secondary | ICD-10-CM | POA: Diagnosis not present

## 2019-04-07 DIAGNOSIS — I10 Essential (primary) hypertension: Secondary | ICD-10-CM | POA: Diagnosis not present

## 2019-04-07 DIAGNOSIS — E1129 Type 2 diabetes mellitus with other diabetic kidney complication: Secondary | ICD-10-CM | POA: Diagnosis not present

## 2019-04-07 DIAGNOSIS — N183 Chronic kidney disease, stage 3 (moderate): Secondary | ICD-10-CM | POA: Diagnosis not present

## 2019-04-07 DIAGNOSIS — Z79899 Other long term (current) drug therapy: Secondary | ICD-10-CM | POA: Diagnosis not present

## 2019-04-14 DIAGNOSIS — M1000 Idiopathic gout, unspecified site: Secondary | ICD-10-CM | POA: Diagnosis not present

## 2019-04-14 DIAGNOSIS — E1122 Type 2 diabetes mellitus with diabetic chronic kidney disease: Secondary | ICD-10-CM | POA: Diagnosis not present

## 2019-04-14 DIAGNOSIS — N183 Chronic kidney disease, stage 3 (moderate): Secondary | ICD-10-CM | POA: Diagnosis not present

## 2019-05-09 ENCOUNTER — Other Ambulatory Visit: Payer: Medicare Other

## 2019-05-09 ENCOUNTER — Other Ambulatory Visit: Payer: Self-pay

## 2019-05-09 DIAGNOSIS — Z20822 Contact with and (suspected) exposure to covid-19: Secondary | ICD-10-CM

## 2019-05-12 LAB — NOVEL CORONAVIRUS, NAA: SARS-CoV-2, NAA: NOT DETECTED

## 2019-06-17 DIAGNOSIS — L820 Inflamed seborrheic keratosis: Secondary | ICD-10-CM | POA: Diagnosis not present

## 2019-07-22 DIAGNOSIS — H01004 Unspecified blepharitis left upper eyelid: Secondary | ICD-10-CM | POA: Diagnosis not present

## 2019-08-18 DIAGNOSIS — E875 Hyperkalemia: Secondary | ICD-10-CM | POA: Diagnosis not present

## 2019-08-18 DIAGNOSIS — M109 Gout, unspecified: Secondary | ICD-10-CM | POA: Diagnosis not present

## 2019-08-18 DIAGNOSIS — I10 Essential (primary) hypertension: Secondary | ICD-10-CM | POA: Diagnosis not present

## 2019-08-18 DIAGNOSIS — N184 Chronic kidney disease, stage 4 (severe): Secondary | ICD-10-CM | POA: Diagnosis not present

## 2019-08-27 DIAGNOSIS — Z23 Encounter for immunization: Secondary | ICD-10-CM | POA: Diagnosis not present

## 2019-08-27 DIAGNOSIS — N183 Chronic kidney disease, stage 3 (moderate): Secondary | ICD-10-CM | POA: Diagnosis not present

## 2019-08-27 DIAGNOSIS — I10 Essential (primary) hypertension: Secondary | ICD-10-CM | POA: Diagnosis not present

## 2019-08-27 DIAGNOSIS — E1122 Type 2 diabetes mellitus with diabetic chronic kidney disease: Secondary | ICD-10-CM | POA: Diagnosis not present

## 2019-10-28 DIAGNOSIS — H52203 Unspecified astigmatism, bilateral: Secondary | ICD-10-CM | POA: Diagnosis not present

## 2019-10-28 DIAGNOSIS — H353131 Nonexudative age-related macular degeneration, bilateral, early dry stage: Secondary | ICD-10-CM | POA: Diagnosis not present

## 2019-10-28 DIAGNOSIS — H524 Presbyopia: Secondary | ICD-10-CM | POA: Diagnosis not present

## 2019-10-28 DIAGNOSIS — Z961 Presence of intraocular lens: Secondary | ICD-10-CM | POA: Diagnosis not present

## 2020-02-12 ENCOUNTER — Ambulatory Visit: Payer: Medicare Other | Attending: Internal Medicine

## 2020-02-12 DIAGNOSIS — Z23 Encounter for immunization: Secondary | ICD-10-CM

## 2020-02-12 NOTE — Progress Notes (Signed)
   Covid-19 Vaccination Clinic  Name:  Robert Cardenas    MRN: [id number] DOB: [birth year]  02/12/2020  [name] was observed post Covid-19 immunization for 15 minutes without incident. He was provided with Vaccine Information Sheet and instruction to access the V-Safe system.   [name] was instructed to call 911 with any severe reactions post vaccine: Marland Kitchen Difficulty breathing  . Swelling of face and throat  . A fast heartbeat  . A bad rash all over body  . Dizziness and weakness   Immunizations Administered    Name Date Dose VIS Date Route   Moderna COVID-19 Vaccine 02/12/2020 11:12 AM 0.5 mL 11/04/2019 Intramuscular   Manufacturer: Moderna   Lot: [id number]   NDC: 25189-842-10

## 2020-03-11 DIAGNOSIS — T162XXA Foreign body in left ear, initial encounter: Secondary | ICD-10-CM | POA: Diagnosis not present

## 2020-03-11 DIAGNOSIS — H903 Sensorineural hearing loss, bilateral: Secondary | ICD-10-CM | POA: Diagnosis not present

## 2020-03-17 ENCOUNTER — Ambulatory Visit: Payer: Medicare Other | Attending: Internal Medicine

## 2020-03-17 DIAGNOSIS — Z23 Encounter for immunization: Secondary | ICD-10-CM

## 2020-03-17 NOTE — Progress Notes (Signed)
   Covid-19 Vaccination Clinic  Name:  Robert Cardenas    MRN: [id number] DOB: [birth year]  03/17/2020  Mr. Demeo was observed post Covid-19 immunization for 15 minutes without incident. He was provided with Vaccine Information Sheet and instruction to access the V-Safe system.   Mr. Housand was instructed to call 911 with any severe reactions post vaccine: Marland Kitchen Difficulty breathing  . Swelling of face and throat  . A fast heartbeat  . A bad rash all over body  . Dizziness and weakness   Immunizations Administered    Name Date Dose VIS Date Route   Moderna COVID-19 Vaccine 03/17/2020 10:35 AM 0.5 mL 11/04/2019 Intramuscular   Manufacturer: Moderna   Lot: [id number]   NDC: 17408-144-81

## 2020-07-27 ENCOUNTER — Ambulatory Visit: Payer: Medicare Other | Admitting: Orthopedic Surgery

## 2020-07-27 ENCOUNTER — Ambulatory Visit: Payer: Medicare Other

## 2020-07-27 ENCOUNTER — Other Ambulatory Visit: Payer: Self-pay

## 2020-07-27 VITALS — BP 143/70 | HR 81 | Ht 69.0 in | Wt 159.0 lb

## 2020-07-27 DIAGNOSIS — Z89422 Acquired absence of other left toe(s): Secondary | ICD-10-CM | POA: Diagnosis not present

## 2020-07-27 DIAGNOSIS — M79675 Pain in left toe(s): Secondary | ICD-10-CM

## 2020-07-27 NOTE — Progress Notes (Signed)
NEW PROBLEM//OFFICE VISIT  Chief Complaint  Patient presents with   Toe Pain    left great toe toenail AND toe painful for a year     86 year old male with great toe abnormality of the nail.  Presents with a nail already up partially.  No history of trauma other than falling on his knee  Dr. Ouida Sills asked me to see him today for possible nail excision     Review of Systems  Constitutional: Negative for chills and fever.  Neurological: Negative for tingling.     Past Medical History:  Diagnosis Date   Acute renal failure (HCC)    04/2015   Gout    Hypertension     Past Surgical History:  Procedure Laterality Date   BALLOON DILATION N/A 05/12/2018   Procedure: BALLOON DILATION;  Surgeon: Malissa Hippo, MD;  Location: [facility];  Service: Endoscopy;  Laterality: N/A;  dilated ampula to 10   CHOLECYSTECTOMY N/A 05/13/2018   Procedure: LAPAROSCOPIC CHOLECYSTECTOMY;  Surgeon: Lucretia Roers, MD;  Location: [facility];  Service: General;  Laterality: N/A;   ERCP N/A 05/12/2018   Procedure: ENDOSCOPIC RETROGRADE CHOLANGIOPANCREATOGRAPHY (ERCP);  Surgeon: Malissa Hippo, MD;  Location: [facility];  Service: Endoscopy;  Laterality: N/A;   PROSTATE SURGERY     REMOVAL OF STONES N/A 05/12/2018   Procedure: REMOVAL OF STONES;  Surgeon: Malissa Hippo, MD;  Location: [facility];  Service: Endoscopy;  Laterality: N/A;   SPHINCTEROTOMY N/A 05/12/2018   Procedure: SPHINCTEROTOMY;  Surgeon: Malissa Hippo, MD;  Location: [facility];  Service: Endoscopy;  Laterality: N/A;    No family history on file. Social History   Tobacco Use   Smoking status: Never Smoker   Smokeless tobacco: Never Used  Substance Use Topics   Alcohol use: No   Drug use: No    No Known Allergies  No outpatient medications have been marked as taking for the 07/27/20 encounter (Office Visit) with Vickki Hearing, MD.    BP (!) 143/70    Pulse 81    Ht 5\' 9"  (1.753 m)    Wt 159 lb  (72.1 kg)    BMI 23.48 kg/m   Physical Exam Normal appearance awake alert and oriented x3 mood affect normal. Ortho Exam  Gait slightly altered by the pain is having in his right great toe  The nail is partially detached about 50% and then it is elevated off the nail bed  Mild bunion deformity skin intact color capillary refill normal  MEDICAL DECISION MAKING  A.  Encounter Diagnosis  Name Primary?   Great toe pain, left Yes    B. DATA ANALYSED:   IMAGING: Interpretation of images: X-ray arthritis of the great toe at the MTP joint moderate bunion deformity  Orders: None  Outside records reviewed: None   C. MANAGEMENT   I remove the nail by hand dressing was applied he will keep that on for 2 days and then apply Band-Aid 4 weeks nail should grow back follow-up with as needed  No orders of the defined types were placed in this encounter.     [place], MD  07/27/2020 4:54 PM

## 2020-07-27 NOTE — Patient Instructions (Signed)
REMOVE DRESSING IN 2 DAYS   APPLY BAND AID FOR 1 WEEK AFTER THAT

## 2021-05-10 DIAGNOSIS — H524 Presbyopia: Secondary | ICD-10-CM | POA: Diagnosis not present

## 2021-05-10 DIAGNOSIS — Z961 Presence of intraocular lens: Secondary | ICD-10-CM | POA: Diagnosis not present

## 2021-05-10 DIAGNOSIS — H353132 Nonexudative age-related macular degeneration, bilateral, intermediate dry stage: Secondary | ICD-10-CM | POA: Diagnosis not present

## 2021-05-10 DIAGNOSIS — H40013 Open angle with borderline findings, low risk, bilateral: Secondary | ICD-10-CM | POA: Diagnosis not present

## 2022-07-13 ENCOUNTER — Emergency Department (HOSPITAL_COMMUNITY): Payer: Medicare Other

## 2022-07-13 ENCOUNTER — Other Ambulatory Visit: Payer: Self-pay

## 2022-07-13 ENCOUNTER — Inpatient Hospital Stay (HOSPITAL_COMMUNITY)
Admission: EM | Admit: 2022-07-13 | Discharge: 2022-07-21 | DRG: 871 | Disposition: A | Discharge status: Skilled Nursing Facility | Payer: Medicare Other | Attending: Family Medicine | Admitting: Family Medicine

## 2022-07-13 ENCOUNTER — Encounter (HOSPITAL_COMMUNITY): Payer: Self-pay | Admitting: Emergency Medicine

## 2022-07-13 DIAGNOSIS — E8729 Other acidosis: Secondary | ICD-10-CM

## 2022-07-13 DIAGNOSIS — F0390 Unspecified dementia without behavioral disturbance: Secondary | ICD-10-CM | POA: Diagnosis present

## 2022-07-13 DIAGNOSIS — R918 Other nonspecific abnormal finding of lung field: Secondary | ICD-10-CM | POA: Diagnosis not present

## 2022-07-13 DIAGNOSIS — I248 Other forms of acute ischemic heart disease: Secondary | ICD-10-CM | POA: Diagnosis present

## 2022-07-13 DIAGNOSIS — D696 Thrombocytopenia, unspecified: Secondary | ICD-10-CM | POA: Diagnosis present

## 2022-07-13 DIAGNOSIS — N189 Chronic kidney disease, unspecified: Secondary | ICD-10-CM | POA: Diagnosis not present

## 2022-07-13 DIAGNOSIS — Z66 Do not resuscitate: Secondary | ICD-10-CM | POA: Diagnosis not present

## 2022-07-13 DIAGNOSIS — E46 Unspecified protein-calorie malnutrition: Secondary | ICD-10-CM | POA: Diagnosis present

## 2022-07-13 DIAGNOSIS — E872 Acidosis, unspecified: Secondary | ICD-10-CM | POA: Diagnosis not present

## 2022-07-13 DIAGNOSIS — R64 Cachexia: Secondary | ICD-10-CM | POA: Diagnosis present

## 2022-07-13 DIAGNOSIS — Z79899 Other long term (current) drug therapy: Secondary | ICD-10-CM

## 2022-07-13 DIAGNOSIS — E875 Hyperkalemia: Secondary | ICD-10-CM | POA: Diagnosis not present

## 2022-07-13 DIAGNOSIS — Z515 Encounter for palliative care: Secondary | ICD-10-CM | POA: Diagnosis not present

## 2022-07-13 DIAGNOSIS — Z7982 Long term (current) use of aspirin: Secondary | ICD-10-CM

## 2022-07-13 DIAGNOSIS — A419 Sepsis, unspecified organism: Secondary | ICD-10-CM | POA: Diagnosis not present

## 2022-07-13 DIAGNOSIS — E870 Hyperosmolality and hypernatremia: Secondary | ICD-10-CM | POA: Diagnosis not present

## 2022-07-13 DIAGNOSIS — N1832 Chronic kidney disease, stage 3b: Secondary | ICD-10-CM | POA: Diagnosis present

## 2022-07-13 DIAGNOSIS — I129 Hypertensive chronic kidney disease with stage 1 through stage 4 chronic kidney disease, or unspecified chronic kidney disease: Secondary | ICD-10-CM | POA: Diagnosis present

## 2022-07-13 DIAGNOSIS — N390 Urinary tract infection, site not specified: Secondary | ICD-10-CM | POA: Diagnosis present

## 2022-07-13 DIAGNOSIS — L89326 Pressure-induced deep tissue damage of left buttock: Secondary | ICD-10-CM | POA: Diagnosis present

## 2022-07-13 DIAGNOSIS — Z9049 Acquired absence of other specified parts of digestive tract: Secondary | ICD-10-CM

## 2022-07-13 DIAGNOSIS — I639 Cerebral infarction, unspecified: Secondary | ICD-10-CM | POA: Clinically undetermined

## 2022-07-13 DIAGNOSIS — Z7189 Other specified counseling: Secondary | ICD-10-CM | POA: Diagnosis not present

## 2022-07-13 DIAGNOSIS — R600 Localized edema: Secondary | ICD-10-CM | POA: Diagnosis not present

## 2022-07-13 DIAGNOSIS — Z20822 Contact with and (suspected) exposure to covid-19: Secondary | ICD-10-CM | POA: Diagnosis present

## 2022-07-13 DIAGNOSIS — R6521 Severe sepsis with septic shock: Secondary | ICD-10-CM | POA: Diagnosis not present

## 2022-07-13 DIAGNOSIS — A4159 Other Gram-negative sepsis: Principal | ICD-10-CM | POA: Diagnosis present

## 2022-07-13 DIAGNOSIS — J9601 Acute respiratory failure with hypoxia: Secondary | ICD-10-CM | POA: Diagnosis not present

## 2022-07-13 DIAGNOSIS — N3001 Acute cystitis with hematuria: Secondary | ICD-10-CM

## 2022-07-13 DIAGNOSIS — R0902 Hypoxemia: Secondary | ICD-10-CM | POA: Diagnosis not present

## 2022-07-13 DIAGNOSIS — R001 Bradycardia, unspecified: Secondary | ICD-10-CM | POA: Diagnosis present

## 2022-07-13 DIAGNOSIS — I10 Essential (primary) hypertension: Secondary | ICD-10-CM | POA: Diagnosis not present

## 2022-07-13 DIAGNOSIS — E860 Dehydration: Secondary | ICD-10-CM | POA: Diagnosis present

## 2022-07-13 DIAGNOSIS — G9341 Metabolic encephalopathy: Secondary | ICD-10-CM

## 2022-07-13 DIAGNOSIS — M19012 Primary osteoarthritis, left shoulder: Secondary | ICD-10-CM | POA: Diagnosis not present

## 2022-07-13 DIAGNOSIS — R739 Hyperglycemia, unspecified: Secondary | ICD-10-CM | POA: Diagnosis not present

## 2022-07-13 DIAGNOSIS — N179 Acute kidney failure, unspecified: Secondary | ICD-10-CM

## 2022-07-13 DIAGNOSIS — M109 Gout, unspecified: Secondary | ICD-10-CM | POA: Diagnosis present

## 2022-07-13 DIAGNOSIS — L89316 Pressure-induced deep tissue damage of right buttock: Secondary | ICD-10-CM | POA: Diagnosis not present

## 2022-07-13 DIAGNOSIS — R4189 Other symptoms and signs involving cognitive functions and awareness: Secondary | ICD-10-CM | POA: Diagnosis present

## 2022-07-13 DIAGNOSIS — R778 Other specified abnormalities of plasma proteins: Secondary | ICD-10-CM | POA: Diagnosis not present

## 2022-07-13 DIAGNOSIS — I700 Atherosclerosis of aorta: Secondary | ICD-10-CM | POA: Diagnosis not present

## 2022-07-13 DIAGNOSIS — Z8546 Personal history of malignant neoplasm of prostate: Secondary | ICD-10-CM

## 2022-07-13 DIAGNOSIS — E538 Deficiency of other specified B group vitamins: Secondary | ICD-10-CM | POA: Diagnosis present

## 2022-07-13 DIAGNOSIS — L03116 Cellulitis of left lower limb: Secondary | ICD-10-CM | POA: Diagnosis present

## 2022-07-13 DIAGNOSIS — B964 Proteus (mirabilis) (morganii) as the cause of diseases classified elsewhere: Secondary | ICD-10-CM | POA: Diagnosis present

## 2022-07-13 DIAGNOSIS — L03115 Cellulitis of right lower limb: Secondary | ICD-10-CM | POA: Diagnosis present

## 2022-07-13 DIAGNOSIS — F03C0 Unspecified dementia, severe, without behavioral disturbance, psychotic disturbance, mood disturbance, and anxiety: Secondary | ICD-10-CM | POA: Diagnosis not present

## 2022-07-13 DIAGNOSIS — Z6821 Body mass index (BMI) 21.0-21.9, adult: Secondary | ICD-10-CM

## 2022-07-13 DIAGNOSIS — R571 Hypovolemic shock: Secondary | ICD-10-CM | POA: Diagnosis not present

## 2022-07-13 DIAGNOSIS — R4182 Altered mental status, unspecified: Secondary | ICD-10-CM | POA: Diagnosis not present

## 2022-07-13 DIAGNOSIS — J9811 Atelectasis: Secondary | ICD-10-CM | POA: Diagnosis present

## 2022-07-13 DIAGNOSIS — G319 Degenerative disease of nervous system, unspecified: Secondary | ICD-10-CM | POA: Diagnosis not present

## 2022-07-13 DIAGNOSIS — N19 Unspecified kidney failure: Secondary | ICD-10-CM

## 2022-07-13 DIAGNOSIS — R1310 Dysphagia, unspecified: Secondary | ICD-10-CM | POA: Diagnosis present

## 2022-07-13 DIAGNOSIS — R404 Transient alteration of awareness: Secondary | ICD-10-CM | POA: Diagnosis not present

## 2022-07-13 DIAGNOSIS — N170 Acute kidney failure with tubular necrosis: Secondary | ICD-10-CM | POA: Diagnosis not present

## 2022-07-13 DIAGNOSIS — I9589 Other hypotension: Secondary | ICD-10-CM | POA: Diagnosis not present

## 2022-07-13 DIAGNOSIS — R8281 Pyuria: Secondary | ICD-10-CM

## 2022-07-13 LAB — COMPREHENSIVE METABOLIC PANEL
ALT: 13 U/L (ref 0–44)
AST: 39 U/L (ref 15–41)
Albumin: 2.4 g/dL — ABNORMAL LOW (ref 3.5–5.0)
Alkaline Phosphatase: 63 U/L (ref 38–126)
Anion gap: 17 — ABNORMAL HIGH (ref 5–15)
BUN: 144 mg/dL — ABNORMAL HIGH (ref 8–23)
CO2: 16 mmol/L — ABNORMAL LOW (ref 22–32)
Calcium: 7.8 mg/dL — ABNORMAL LOW (ref 8.9–10.3)
Chloride: 130 mmol/L — ABNORMAL HIGH (ref 98–111)
Creatinine, Ser: 6.07 mg/dL — ABNORMAL HIGH (ref 0.61–1.24)
GFR, Estimated: 8 mL/min — ABNORMAL LOW (ref >60)
Glucose, Bld: 196 mg/dL — ABNORMAL HIGH (ref 70–99)
Potassium: 5.3 mmol/L — ABNORMAL HIGH (ref 3.5–5.1)
Sodium: 163 mmol/L (ref 135–145)
Total Bilirubin: 1.2 mg/dL (ref 0.3–1.2)
Total Protein: 5.4 g/dL — ABNORMAL LOW (ref 6.5–8.1)

## 2022-07-13 LAB — CBC WITH DIFFERENTIAL/PLATELET
Abs Immature Granulocytes: 0.16 10*3/uL — ABNORMAL HIGH (ref 0.00–0.07)
Basophils Absolute: 0 10*3/uL (ref 0.0–0.1)
Basophils Relative: 0 %
Eosinophils Absolute: 0 10*3/uL (ref 0.0–0.5)
Eosinophils Relative: 0 %
HCT: 45.1 % (ref 39.0–52.0)
Hemoglobin: 13.8 g/dL (ref 13.0–17.0)
Immature Granulocytes: 1 %
Lymphocytes Relative: 3 %
Lymphs Abs: 0.6 10*3/uL — ABNORMAL LOW (ref 0.7–4.0)
MCH: 34.1 pg — ABNORMAL HIGH (ref 26.0–34.0)
MCHC: 30.6 g/dL (ref 30.0–36.0)
MCV: 111.4 fL — ABNORMAL HIGH (ref 80.0–100.0)
Monocytes Absolute: 0.2 10*3/uL (ref 0.1–1.0)
Monocytes Relative: 1 %
Neutro Abs: 19.6 10*3/uL — ABNORMAL HIGH (ref 1.7–7.7)
Neutrophils Relative %: 95 %
Platelets: 101 10*3/uL — ABNORMAL LOW (ref 150–400)
RBC: 4.05 MIL/uL — ABNORMAL LOW (ref 4.22–5.81)
RDW: 13.7 % (ref 11.5–15.5)
WBC: 20.5 10*3/uL — ABNORMAL HIGH (ref 4.0–10.5)
nRBC: 0.1 % (ref 0.0–0.2)

## 2022-07-13 LAB — BLOOD GAS, ARTERIAL
Acid-base deficit: 6.7 mmol/L — ABNORMAL HIGH (ref 0.0–2.0)
Bicarbonate: 17.3 mmol/L — ABNORMAL LOW (ref 20.0–28.0)
Drawn by: 22223
FIO2: 28 %
O2 Saturation: 99.6 %
Patient temperature: 37.1
pCO2 arterial: 30 mmHg — ABNORMAL LOW (ref 32–48)
pH, Arterial: 7.37 (ref 7.35–7.45)
pO2, Arterial: 175 mmHg — ABNORMAL HIGH (ref 83–108)

## 2022-07-13 LAB — URINALYSIS, ROUTINE W REFLEX MICROSCOPIC
Bilirubin Urine: NEGATIVE
Glucose, UA: NEGATIVE mg/dL
Ketones, ur: NEGATIVE mg/dL
Nitrite: NEGATIVE
Protein, ur: 100 mg/dL — AB
RBC / HPF: >50 RBC/hpf — ABNORMAL HIGH (ref 0–5)
Specific Gravity, Urine: 1.017 (ref 1.005–1.030)
pH: 5 (ref 5.0–8.0)

## 2022-07-13 LAB — LACTIC ACID, PLASMA
Lactic Acid, Venous: 7.9 mmol/L (ref 0.5–1.9)
Lactic Acid, Venous: 7.6 mmol/L (ref 0.5–1.9)
Lactic Acid, Venous: 5.9 mmol/L (ref 0.5–1.9)

## 2022-07-13 LAB — PROTIME-INR
INR: 1.4 — ABNORMAL HIGH (ref 0.8–1.2)
Prothrombin Time: 17.2 seconds — ABNORMAL HIGH (ref 11.4–15.2)

## 2022-07-13 LAB — RESP PANEL BY RT-PCR (FLU A&B, COVID) ARPGX2
Influenza A by PCR: NEGATIVE
Influenza B by PCR: NEGATIVE
SARS Coronavirus 2 by RT PCR: NEGATIVE

## 2022-07-13 LAB — I-STAT CHEM 8, ED
BUN: >130 mg/dL — ABNORMAL HIGH (ref 8–23)
Calcium, Ion: 0.91 mmol/L — ABNORMAL LOW (ref 1.15–1.40)
Chloride: 129 mmol/L — ABNORMAL HIGH (ref 98–111)
Creatinine, Ser: 6.9 mg/dL — ABNORMAL HIGH (ref 0.61–1.24)
Glucose, Bld: 271 mg/dL — ABNORMAL HIGH (ref 70–99)
HCT: 52 % (ref 39.0–52.0)
Hemoglobin: 17.7 g/dL — ABNORMAL HIGH (ref 13.0–17.0)
Potassium: 5.7 mmol/L — ABNORMAL HIGH (ref 3.5–5.1)
Sodium: 161 mmol/L (ref 135–145)
TCO2: 16 mmol/L — ABNORMAL LOW (ref 22–32)

## 2022-07-13 LAB — APTT: aPTT: 27 seconds (ref 24–36)

## 2022-07-13 LAB — TROPONIN I (HIGH SENSITIVITY)
Troponin I (High Sensitivity): 206 ng/L (ref <18)
Troponin I (High Sensitivity): 231 ng/L (ref <18)

## 2022-07-13 LAB — MAGNESIUM: Magnesium: 1.9 mg/dL (ref 1.7–2.4)

## 2022-07-13 LAB — PHOSPHORUS: Phosphorus: 2 mg/dL — ABNORMAL LOW (ref 2.5–4.6)

## 2022-07-13 LAB — CBG MONITORING, ED: Glucose-Capillary: 260 mg/dL — ABNORMAL HIGH (ref 70–99)

## 2022-07-13 MED ORDER — NOREPINEPHRINE 4 MG/250ML-% IV SOLN
2.0000 ug/min | INTRAVENOUS | Status: DC
  Administered 2022-07-13: 2 ug/min via INTRAVENOUS
  Filled 2022-07-13: qty 250

## 2022-07-13 MED ORDER — LACTATED RINGERS IV BOLUS (SEPSIS)
1000.0000 mL | Freq: Once | INTRAVENOUS | Status: AC
  Administered 2022-07-13: 1000 mL via INTRAVENOUS

## 2022-07-13 MED ORDER — LACTATED RINGERS IV BOLUS (SEPSIS)
250.0000 mL | Freq: Once | INTRAVENOUS | Status: AC
  Administered 2022-07-13: 250 mL via INTRAVENOUS

## 2022-07-13 MED ORDER — SODIUM CHLORIDE 0.9 % IV SOLN: INTRAVENOUS | Status: DC

## 2022-07-13 MED ORDER — METRONIDAZOLE 500 MG/100ML IV SOLN
500.0000 mg | Freq: Once | INTRAVENOUS | Status: AC
  Administered 2022-07-13: 500 mg via INTRAVENOUS
  Filled 2022-07-13: qty 100

## 2022-07-13 MED ORDER — VANCOMYCIN VARIABLE DOSE PER UNSTABLE RENAL FUNCTION (PHARMACIST DOSING): Status: DC

## 2022-07-13 MED ORDER — SODIUM CHLORIDE 0.9 % IV SOLN: 250.0000 mL | INTRAVENOUS | Status: DC

## 2022-07-13 MED ORDER — SODIUM CHLORIDE 0.9 % IV SOLN
2.0000 g | Freq: Once | INTRAVENOUS | Status: AC
  Administered 2022-07-13: 2 g via INTRAVENOUS
  Filled 2022-07-13: qty 12.5

## 2022-07-13 MED ORDER — SODIUM CHLORIDE 0.9 % IV SOLN
1.0000 g | INTRAVENOUS | Status: DC
  Administered 2022-07-14: 1 g via INTRAVENOUS
  Filled 2022-07-13 (×2): qty 10

## 2022-07-13 MED ORDER — LACTATED RINGERS IV SOLN: INTRAVENOUS | Status: DC

## 2022-07-13 MED ORDER — VANCOMYCIN HCL IN DEXTROSE 1-5 GM/200ML-% IV SOLN
1000.0000 mg | Freq: Once | INTRAVENOUS | Status: AC
  Administered 2022-07-13: 1000 mg via INTRAVENOUS
  Filled 2022-07-13: qty 200

## 2022-07-13 NOTE — Assessment & Plan Note (Addendum)
History of hypertension.  Blood pressure initially 56/34, initially required pressors  -Continue Levophed for MAP less than 65 -Status post IVF Fluids -Blood pressure has improved, now hypertensive, resuming home medication of  Norvasc, doxazosin

## 2022-07-13 NOTE — ED Provider Notes (Signed)
[facility] EMERGENCY DEPARTMENT Provider Note   CSN: [id number] Arrival date & time: 07/13/22  1420     History  Chief Complaint  Patient presents with   Altered Mental Status    Robert Cardenas is a 88 y.o. male.  With PMH of HTN, CKD brought in by EMS from home after son found patient unresponsive in chair with agonal respirations.  According to son, patient is stubborn and likely undiagnosed dementia but stubborn and living at home by himself.  He will have a cousin Lauris Poag who sometimes visits the patient and takes care of his bills and give some food and water however the patient does not have an appetite and does not eat often and may be will drink some tea.  He was last seen by his son 2 days prior and was awake and alert and converse and and asking questions about his family.  When he came to check on him today he was unresponsive.  EMS found him covered in urine and were concern for bedbugs.  They were unsure if patient had a pulse.     Altered Mental Status      Home Medications Prior to Admission medications   Medication Sig Start Date End Date Taking? Authorizing Provider  acetaminophen (TYLENOL) 325 MG tablet Take 2 tablets (650 mg total) by mouth every 6 (six) hours as needed for mild pain (or Fever >/= 101). 04/17/15   Carylon Perches, MD  allopurinol (ZYLOPRIM) 100 MG tablet Take 200 mg by mouth daily.    [provider]  amLODipine (NORVASC) 5 MG tablet Take 5 mg by mouth daily.    [provider]  aspirin EC 81 MG tablet Take 81 mg by mouth daily.    [provider]  doxazosin (CARDURA) 8 MG tablet Take 4 mg by mouth daily.     [provider]  vitamin C (ASCORBIC ACID) 500 MG tablet Take 500 mg by mouth daily.    [provider]      Allergies    Patient has no known allergies.    Review of Systems   Review of Systems  Physical Exam Updated Vital Signs BP (!) 159/119   Pulse 80   Temp (!) 96.6 F (35.9 C)  (Axillary)   Resp (!) 22   Ht 5\' 9"  (1.753 m)   Wt 65.8 kg   SpO2 (!) 86%   BMI 21.41 kg/m  Physical Exam Constitutional: Eyes open but not answering questions, not following commands, cachectic and ill appearing Eyes: Conjunctivae are normal. ENT      Head: Normocephalic and atraumatic.      Nose: No congestion.      Mouth/Throat: Mucous membranes are severely dry.      Neck: No stridor. Cardiovascular: S1, S2, faint palpable bilateral femoral pulses Respiratory: Rhonchi bilaterally, agonal respirations, satting mid 80s on nonrebreather  gastrointestinal: Soft and nontender. Cachectic Musculoskeletal:       Right lower leg: No tenderness or edema.      Left lower leg: No tenderness or edema. Neurologic: Altered, awake but not following commands or answering questions, breathing on own, PERRL, intermittently grabbing at Foley catheter Skin: Skin is cool, dry Psychiatric: altered, not responsive  ED Results / Procedures / Treatments   Labs (all labs ordered are listed, but only abnormal results are displayed) Labs Reviewed  CBG MONITORING, ED - Abnormal; Notable for the following components:      Result Value   Glucose-Capillary 260 (*)  All other components within normal limits  I-STAT CHEM 8, ED - Abnormal; Notable for the following components:   Sodium 161 (*)    Potassium 5.7 (*)    Chloride 129 (*)    BUN >130 (*)    Creatinine, Ser 6.90 (*)    Glucose, Bld 271 (*)    Calcium, Ion 0.91 (*)    TCO2 16 (*)    Hemoglobin 17.7 (*)    All other components within normal limits  RESP PANEL BY RT-PCR (FLU A&B, COVID) ARPGX2  CULTURE, BLOOD (ROUTINE X 2)  CULTURE, BLOOD (ROUTINE X 2)  URINE CULTURE  MRSA NEXT GEN BY PCR, NASAL  LACTIC ACID, PLASMA  LACTIC ACID, PLASMA  COMPREHENSIVE METABOLIC PANEL  CBC WITH DIFFERENTIAL/PLATELET  PROTIME-INR  APTT  URINALYSIS, ROUTINE W REFLEX MICROSCOPIC  MAGNESIUM  PHOSPHORUS  TROPONIN I (HIGH SENSITIVITY)  TROPONIN I (HIGH  SENSITIVITY)    EKG EKG Interpretation  Date/Time:  Thursday July 13 2022 14:45:59 EDT Ventricular Rate:  77 PR Interval:  207 QRS Duration: 90 QT Interval:  444 QTC Calculation: 503 R Axis:   266 Text Interpretation: Sinus rhythm Left anterior fascicular block Probable anterior infarct, age indeterminate Lateral leads are also involved Prolonged QT interval Confirmed by Vivien Rossetti ([id number]) on 07/13/2022 3:13:50 PM  Radiology CT Head Wo Contrast  Result Date: 07/13/2022 CLINICAL DATA:  Altered mental status EXAM: CT HEAD WITHOUT CONTRAST TECHNIQUE: Contiguous axial images were obtained from the base of the skull through the vertex without intravenous contrast. RADIATION DOSE REDUCTION: This exam was performed according to the departmental dose-optimization program which includes automated exposure control, adjustment of the mA and/or kV according to patient size and/or use of iterative reconstruction technique. COMPARISON:  None Available. FINDINGS: Brain: No evidence of acute infarction, hemorrhage, hydrocephalus, extra-axial collection or mass lesion/mass effect. Patchy low-density changes within the periventricular and subcortical white matter compatible with chronic microvascular ischemic change. Mild diffuse cerebral volume loss. Vascular: Atherosclerotic calcifications involving the large vessels of the skull base. No unexpected hyperdense vessel. Skull: Normal. Negative for fracture or focal lesion. Sinuses/Orbits: No acute finding. Other: None. IMPRESSION: 1. No acute intracranial abnormality. 2. Chronic microvascular ischemic change and cerebral volume loss. Electronically Signed   By: Duanne Guess D.O.   On: 07/13/2022 16:13   DG Chest Port 1 View  Result Date: 07/13/2022 CLINICAL DATA:  Concern for sepsis, altered mental status EXAM: PORTABLE CHEST 1 VIEW COMPARISON:  05/10/2018, 02/13/2016 FINDINGS: Unchanged cardiac and mediastinal contours. Aortic atherosclerosis. Left  retrocardiac and right basilar heterogeneous opacities. No pleural effusion or pneumothorax. No acute osseous abnormality. Degenerative changes in the right-greater-than-left shoulder. IMPRESSION: Left retrocardiac and right basilar heterogeneous opacities, which are nonspecific and may represent atelectasis, infection, or inflammatory process. Electronically Signed   By: Wiliam Ke M.D.   On: 07/13/2022 15:15    Procedures .Critical Care  Performed by: Mardene Sayer, MD Authorized by: Mardene Sayer, MD   Critical care provider statement:    Critical care time (minutes):  50   Critical care was necessary to treat or prevent imminent or life-threatening deterioration of the following conditions:  Shock and renal failure   Critical care was time spent personally by me on the following activities:  Development of treatment plan with patient or surrogate, discussions with consultants, evaluation of patient's response to treatment, examination of patient, ordering and review of laboratory studies, ordering and review of radiographic studies, ordering and performing treatments and interventions, pulse oximetry, re-evaluation  of patient's condition, review of old charts and obtaining history from patient or surrogate   Care discussed with: admitting provider     Require constant cardiac and pulse ox monitoring  Medications Ordered in ED Medications  lactated ringers infusion ( Intravenous New Bag/Given 07/13/22 1615)  ceFEPIme (MAXIPIME) 2 g in sodium chloride 0.9 % 100 mL IVPB (has no administration in time range)  metroNIDAZOLE (FLAGYL) IVPB 500 mg (has no administration in time range)  vancomycin (VANCOCIN) IVPB 1000 mg/200 mL premix (has no administration in time range)  0.9 %  sodium chloride infusion (250 mLs Intravenous New Bag/Given 07/13/22 1525)  norepinephrine (LEVOPHED) 4mg  in (0.016 mg/mL) premix infusion (0 mcg/min Intravenous Stopped 07/13/22 1535)  ceFEPIme  (MAXIPIME) 1 g in sodium chloride 0.9 % 100 mL IVPB (has no administration in time range)  vancomycin variable dose per unstable renal function (pharmacist dosing) (has no administration in time range)  lactated ringers bolus 1,000 mL (0 mLs Intravenous Stopped 07/13/22 1518)    And  lactated ringers bolus 1,000 mL (0 mLs Intravenous Stopped 07/13/22 1500)    And  lactated ringers bolus 250 mL (0 mLs Intravenous Stopped 07/13/22 1552)    ED Course/ Medical Decision Making/ A&P Clinical Course as of 07/13/22 1617  Thu Jul 13, 2022  1615 Patient signed out to Dr. Jul 15, 2022 pending rest of blood work and CT head read.  Plan to admit for continued IV fluids and antibiotics and likely inpatient palliative care consult as noted in my MDM. [VB]    Clinical Course User Index [VB] Hyacinth Meeker, MD                           Medical Decision Making Robert Cardenas is a 88 y.o. male.  With PMH of HTN, CKD brought in by EMS from home after son found patient unresponsive in chair with agonal respirations.  Patient presents from home cachectic, awake but not answering questions or following commands, with agonal respirations satting mid 80s on room air.  On initial arrival, nurses were concerned they were unable to palpate pulse and initiated CPR with compressions for less than a minute.  He had a faint palpable femoral pulse with a blood pressure that corresponded 56/34.  His extremities were cool and dry.  His mucous membranes were severely dry.  Obtained further information from son, there is no POA but if he is his only living direct relative who said that he frequently checks up on patient last time 2 days prior and had noted to be at mental baseline but lives on a tea and toast diet by himself.    His presentation is most consistent with likely hypovolemic shock from poor p.o. intake and renal failure.  His initial i-STAT was remarkable for sodium 161, potassium 5.7, BUN greater than 130 and  creatinine 6.9.  Hemoglobin elevated 17.7.  With shock presentation, also consider sepsis and will cover with broad-spectrum antibiotics, sepsis fluids and will pursue full sepsis workup starting patient on peripheral Levophed.  Initial chest x-ray obtained suggestive of retrocardiac and left-sided consolidation concerning for pneumonia.  Initial EKG with T wave inversions but no STEMI or reciprocal changes, less likely cardiogenic shock but will obtain troponins and further evaluate.  He has no evidence of injury on exam but will obtain CT head.  I discussed the concerning presentation of patient with patient's son Alejos Reinhardt, Truman Hayward. over the phone and at bedside.  I discussed my concerns that he will likely die from underlying pathology he has presented with.  Son has agreed to keep patient DNR/DNI.  Patient will require admission for hypovolemic shock, renal failure and concern for sepsis requiring IV fluids and pressors but will likely require palliative care consult and evaluation inpatient.  Patient's son Josephmichael Lisenbee, Montez Hageman. does not want patient to have indwelling central line or trialysis line for dialysis.  After first 2 L IV fluid bolus, patient was able to come off IV pressors.  However he is only made 10 cc of urine.  We will trial some more IV fluids but likely underlying etiology of hypovolemic shock and renal failure will likely lead to death.  He will require inpatient palliative care consult .  Amount and/or Complexity of Data Reviewed Independent Historian: EMS    Details: Son Labs: ordered. Decision-making details documented in ED Course. Radiology: ordered and independent interpretation performed. Decision-making details documented in ED Course.    Details: No obvious ICH on independent interpretation of CT head ECG/medicine tests: ordered and independent interpretation performed. Decision-making details documented in ED Course.  Risk Prescription drug management.    Final  Clinical Impression(s) / ED Diagnoses Final diagnoses:  Dehydration  Hypovolemic shock (HCC)  Altered mental status, unspecified altered mental status type  Renal failure, unspecified chronicity    Rx / DC Orders ED Discharge Orders     None         Mardene Sayer, MD 07/13/22 1617

## 2022-07-13 NOTE — Assessment & Plan Note (Addendum)
Baseline CKD 3 A-B.  Today creatinine elevated at 6.9, with markedly elevated BUN of 144, in the setting of septic shock, baseline creatinine 1.4 -1.6.  Last checked 2019. -Hydrate -Foley catheter

## 2022-07-13 NOTE — H&P (Addendum)
History and Physical    Robert Cardenas Robert Cardenas:[id number] DOB: Nov 03, 1926 DOA: 07/13/2022  PCP: Carylon Perches, MD   Patient coming from: Home  I have personally briefly reviewed patient's old medical records in [facility]  Chief Complaint: Unresponsive  HPI: Robert Cardenas is a 88 y.o. male with medical history significant for HTN, Gout, hard of hearing.  Patient was brought to the ED via EMS after patient was found at home by his son unresponsive and with agonal respiration. At the time of my evaluation, patient appears conscious-with eyes open, intermittently moving his head from side-to-side, moans a little, but not following directions, a flicker of movement of upper extremities to pain.  Per chart review, ED provider talked to patient's son,-Brenon Latif, [place]., who is patient's only living direct relative, patient was last seen well, and in his normal state of health about 2 days ago.  He lives by himself, on tea and toast diet.   I talked to patients son on the phone, according to son, patient has declined over the past several months, and was trying to establish home care for patient, and likely has undiagnosed oral intake. Patient was found unresponsive by his son, EMS found patient covered in urine, and were initially unsure if patient had a pulse.  ED Course: On arrival to the ED, does concern that patient had no pulse, so CPR was started.  Blood pressure 56/34 recorded, with faint palpable femoral pulse.  Mucous membranes are severely dry.   Hypothermic temperature down to 94.3.  Heart Rate ranging from 25-121.  O2 sats down to 72% on room air.  Initially placed on pressors, but after fluids and with improvement in blood pressure this was discontinued.  Lactic acidosis 7.9 WBC 20. UA with moderate leukocytes many bacteria Troponin 206, Sodium 163, Acute kidney injury creatinine 6.9. Head CT without acute abnormality.  Chest x-ray possible atelectasis versus infection. Family  elected to make patient DNR/DNI, no central lines, but still want full scope of treatment. Broad-spectrum antibiotics IV vancomycin, cefepime and metronidazole. 2.25 L bolus given and fluids been continued at 150 cc/h.  Review of Systems: Unable to ascertain due to altered mental status  Past Medical History:  Diagnosis Date   Acute renal failure (HCC)    04/2015   Gout    Hypertension     Past Surgical History:  Procedure Laterality Date   BALLOON DILATION N/A 05/12/2018   Procedure: BALLOON DILATION;  Surgeon: Malissa Hippo, MD;  Location: [facility];  Service: Endoscopy;  Laterality: N/A;  dilated ampula to 10   CHOLECYSTECTOMY N/A 05/13/2018   Procedure: LAPAROSCOPIC CHOLECYSTECTOMY;  Surgeon: Lucretia Roers, MD;  Location: [facility];  Service: General;  Laterality: N/A;   ERCP N/A 05/12/2018   Procedure: ENDOSCOPIC RETROGRADE CHOLANGIOPANCREATOGRAPHY (ERCP);  Surgeon: Malissa Hippo, MD;  Location: [facility];  Service: Endoscopy;  Laterality: N/A;   PROSTATE SURGERY     REMOVAL OF STONES N/A 05/12/2018   Procedure: REMOVAL OF STONES;  Surgeon: Malissa Hippo, MD;  Location: [facility];  Service: Endoscopy;  Laterality: N/A;   SPHINCTEROTOMY N/A 05/12/2018   Procedure: SPHINCTEROTOMY;  Surgeon: Malissa Hippo, MD;  Location: [facility];  Service: Endoscopy;  Laterality: N/A;     reports that he has never smoked. He has never used smokeless tobacco. He reports that he does not drink alcohol and does not use drugs.  No Known Allergies  Unable to ascertain due to  altered mental status  Prior to Admission medications   Medication Sig Start Date End Date Taking? Authorizing Provider  acetaminophen (TYLENOL) 325 MG tablet Take 2 tablets (650 mg total) by mouth every 6 (six) hours as needed for mild pain (or Fever >/= 101). 04/17/15   Carylon Perches, MD  allopurinol (ZYLOPRIM) 100 MG tablet Take 200 mg by mouth daily.    [provider]  amLODipine (NORVASC) 5 MG  tablet Take 5 mg by mouth daily.    [provider]  aspirin EC 81 MG tablet Take 81 mg by mouth daily.    [provider]  doxazosin (CARDURA) 8 MG tablet Take 4 mg by mouth daily.     [provider]  vitamin C (ASCORBIC ACID) 500 MG tablet Take 500 mg by mouth daily.    [provider]    Physical Exam: Exam limited by patient's mental status Vitals:   07/13/22 1745 07/13/22 1755 07/13/22 1805 07/13/22 1815  BP:  (!) 182/138 (!) 92/40   Pulse: 65 73 73 (!) 54  Resp: (!) 26 (!) 25 (!) 22 (!) 23  Temp: (!) 94.5 F (34.7 C) (!) 94.5 F (34.7 C) (!) 94.6 F (34.8 C) (!) 94.6 F (34.8 C)  TempSrc:      SpO2: (!) 85% 92% 92% (!) 86%  Weight:      Height:        Constitutional: Appears unkempt, strong smell of urine, barely conscious, acutely ill-appearing Vitals:   07/13/22 1745 07/13/22 1755 07/13/22 1805 07/13/22 1815  BP:  (!) 182/138 (!) 92/40   Pulse: 65 73 73 (!) 54  Resp: (!) 26 (!) 25 (!) 22 (!) 23  Temp: (!) 94.5 F (34.7 C) (!) 94.5 F (34.7 C) (!) 94.6 F (34.8 C) (!) 94.6 F (34.8 C)  TempSrc:      SpO2: (!) 85% 92% 92% (!) 86%  Weight:      Height:       Eyes: Pupils equal. ENMT: Mucous membranes markedly dry with crusting Neck: No masses Respiratory: Normal respiratory effort. No accessory muscle use.  Cardiovascular: Regular rate and rhythm, 2+ pitting lower extremity edema worse on the right,  abdomen: no tenderness, no masses palpated. No hepatosplenomegaly. Musculoskeletal: no clubbing / cyanosis.   Skin: Bruises to bilateral lower extremity still around the toes, worse on the left,, with what appears to be maggots on wounds on lower extremity. No induration Neurologic: Barely conscious, not following directions eyes only to touch.,  Flicker of movement of bilateral upper extremity to sternal rub Psychiatric: Somnolent..   Labs on Admission: I have personally reviewed following labs and imaging  studies  CBC: Recent Labs  Lab 07/13/22 1446 07/13/22 1639  WBC  --  20.5*  NEUTROABS  --  19.6*  HGB 17.7* 13.8  HCT 52.0 45.1  MCV  --  111.4*  PLT  --  101*   Basic Metabolic Panel: Recent Labs  Lab 07/13/22 1446 07/13/22 1639  NA 161* 163*  K 5.7* 5.3*  CL 129* 130*  CO2  --  16*  GLUCOSE 271* 196*  BUN >130* 144*  CREATININE 6.90* 6.07*  CALCIUM  --  7.8*  MG  --  1.9  PHOS  --  2.0*   GFR: Estimated Creatinine Clearance: 6.9 mL/min (A) (by C-G formula based on SCr of 6.07 mg/dL (H)). Liver Function Tests: Recent Labs  Lab 07/13/22 1639  AST 39  ALT 13  ALKPHOS 63  BILITOT  1.2  PROT 5.4*  ALBUMIN 2.4*   Coagulation Profile: Recent Labs  Lab 07/13/22 1639  INR 1.4*   CBG: Recent Labs  Lab 07/13/22 1445  GLUCAP 260*   Urine analysis:    Component Value Date/Time   COLORURINE AMBER (A) 07/13/2022 1639   APPEARANCEUR HAZY (A) 07/13/2022 1639   LABSPEC 1.017 07/13/2022 1639   PHURINE 5.0 07/13/2022 1639   GLUCOSEU NEGATIVE 07/13/2022 1639   HGBUR LARGE (A) 07/13/2022 1639   BILIRUBINUR NEGATIVE 07/13/2022 1639   KETONESUR NEGATIVE 07/13/2022 1639   PROTEINUR 100 (A) 07/13/2022 1639   UROBILINOGEN 0.2 04/15/2015 0947   NITRITE NEGATIVE 07/13/2022 1639   LEUKOCYTESUR MODERATE (A) 07/13/2022 1639    Radiological Exams on Admission: CT Head Wo Contrast  Result Date: 07/13/2022 CLINICAL DATA:  Altered mental status EXAM: CT HEAD WITHOUT CONTRAST TECHNIQUE: Contiguous axial images were obtained from the base of the skull through the vertex without intravenous contrast. RADIATION DOSE REDUCTION: This exam was performed according to the departmental dose-optimization program which includes automated exposure control, adjustment of the mA and/or kV according to patient size and/or use of iterative reconstruction technique. COMPARISON:  None Available. FINDINGS: Brain: No evidence of acute infarction, hemorrhage, hydrocephalus, extra-axial collection  or mass lesion/mass effect. Patchy low-density changes within the periventricular and subcortical white matter compatible with chronic microvascular ischemic change. Mild diffuse cerebral volume loss. Vascular: Atherosclerotic calcifications involving the large vessels of the skull base. No unexpected hyperdense vessel. Skull: Normal. Negative for fracture or focal lesion. Sinuses/Orbits: No acute finding. Other: None. IMPRESSION: 1. No acute intracranial abnormality. 2. Chronic microvascular ischemic change and cerebral volume loss. Electronically Signed   By: Duanne Guess D.O.   On: 07/13/2022 16:13   DG Chest Port 1 View  Result Date: 07/13/2022 CLINICAL DATA:  Concern for sepsis, altered mental status EXAM: PORTABLE CHEST 1 VIEW COMPARISON:  05/10/2018, 02/13/2016 FINDINGS: Unchanged cardiac and mediastinal contours. Aortic atherosclerosis. Left retrocardiac and right basilar heterogeneous opacities. No pleural effusion or pneumothorax. No acute osseous abnormality. Degenerative changes in the right-greater-than-left shoulder. IMPRESSION: Left retrocardiac and right basilar heterogeneous opacities, which are nonspecific and may represent atelectasis, infection, or inflammatory process. Electronically Signed   By: Wiliam Ke M.D.   On: 07/13/2022 15:15    EKG: Independently reviewed.  Sinus rhythm rate 77.  QTc prolonged 503.  T wave inversions in lead I, aVL, V5 and V6 with new compared to EKG from 2019  Assessment/Plan Principal Problem:   Septic shock (HCC) Active Problems:   Acute kidney injury superimposed on CKD (HCC)   Elevated troponin   UTI (urinary tract infection)   Acute metabolic encephalopathy   Hypernatremia   HTN (hypertension)   Acute respiratory failure with hypoxia (HCC)    Assessment and Plan: * Septic shock (HCC) Patient found unresponsive, meeting sepsis criteria with hypothermia- temperature down to 94.3, significant leukocytosis of 20, some tachycardia heart  rate ranging from 25-134 (bradycardia may be secondary to hypothermia), with tachypnea heart rate 21-31.  Hypotensive down to 56/34 improved initially with pressors and now with fluids.  Evidence of endorgan dysfunction with multiple organ failure-lactic acidosis of 7.9, acute kidney injury, elevated troponin, thrombocytopenia, acute respiratory failure and encephalopathy.  Source of sepsis likely UTI.  Chest x-ray with possible atelectasis or infection. -Continue pressors for MAP less than 65 -Broad-spectrum antibiotics IV vancomycin cefepime and metronidazole -Family has elected for DNR/DNI, no central lines, but otherwise with full scope of treatment -2.25 L bolus given, continue  1/2 N/s 125cc/hr x 1 day -Follow-up blood and urine cultures -ABG  Acute metabolic encephalopathy Likely secondary to septic shock, hypovolemia and azotemia with BUN of 144.  Head CT without acute abnormality, shows chronic microvascular ischemic changes and cerebral volume loss.  Son reports likely undiagnosed dementia. -IV antibiotics and IV fluids -Remain n.p.o. - CBG Q8 hourly-   UTI (urinary tract infection) UTI with sepsis septic shock.  UA with moderate leukocytes many bacteria. -Follow-up urine cultures  Elevated troponin Troponin elevated at 206.  Likely demand ischemia from severe sepsis and hypovolemia.  EKG with T wave changes in lead I, aVL, V5, V6. - Trend troponin - Echo  Acute kidney injury superimposed on CKD (HCC) Baseline CKD 3 A-B.  Today creatinine elevated at 6.9, with markedly elevated BUN of 144, in the setting of septic shock, baseline creatinine 1.4 -1.6.  Last checked 2019. -Hydrate -Foley catheter  Hypernatremia Sodium 163, likely hypovolemic hyponatremia. -Hydrate with half-normal saline  Acute respiratory failure with hypoxia (HCC) O2 sats down to 72% on room air.  Respiratory failure likely due to endorgan dysfunction from septic shock, versus possible pneumonia as suggested  on chest x-ray.  Right lower extremity is more swollen compared to left. -Obtain ABG -Broad-spectrum antibiotics  -Supplemental O2 -Respiratory protocol -DNR, DNI -Right lower extremity venous Dopplers  Increased anion gap metabolic acidosis Anion gap metabolic acidosis, anion gap of 17, serum bicarb of 16, likely secondary to severe lactic acidosis of 7.9, and azotemia creatinine of 6.9. -Hydrate and trend  HTN (hypertension) History of hypertension.  Blood pressure initially 56/34, initially required pressors and then weaned off, improved with fluids. -Continue Levophed for MAP less than 65 -IVF Fluids -Hold Norvasc, doxazosin   Patient is severely ill with advanced age and multiorgan dysfunction, which have been documented above, patient has a high chance of mortality during this admission.  CRITICAL CARE Performed by: Onnie Boer   Total critical care time: 70 minutes  Critical care time was exclusive of separately billable procedures and treating other patients.  Critical care was necessary to treat or prevent imminent or life-threatening deterioration.  Critical care was time spent personally by me on the following activities: development of treatment plan with patient and/or surrogate as well as nursing, discussions with consultants, evaluation of patient's response to treatment, examination of patient, obtaining history from patient or surrogate, ordering and performing treatments and interventions, ordering and review of laboratory studies, ordering and review of radiographic studies, pulse oximetry and re-evaluation of patient's condition.   DVT prophylaxis: Heparin Code Status: DNR/DNI.  I confirmed with patients Son- Mr. trevan messman, on the phone, consistent with ED provider communication with- son.   Family Communication: I talked to Patient's only living direct relative is Truman Hayward, Montez Hageman., on the phone, patient has no designated POA. I explained patients  clinical status, severity of illness, multi-organ failure and poor prognosis. Confirmed DNR status. Son is on HD currently and agrees no HD for patient. Disposition Plan: ~ > 2 days Consults called: None Admission status: Inpt stepdown I certify that at the point of admission it is my clinical judgment that the patient will require inpatient hospital care spanning beyond 2 midnights from the point of admission due to high intensity of service, high risk for further deterioration and high frequency of surveillance required.   Author: Onnie Boer, MD 07/13/2022 10:54 PM  For on call review [email or web address].

## 2022-07-13 NOTE — Assessment & Plan Note (Addendum)
-  Resolved sepsis physiology  On admission: patient found unresponsive, meeting sepsis criteria with hypothermia- temperature down to 94.3,  leukocytosis of 20, tachycardia heart rate ranging from 25-134 (bradycardia may be secondary to hypothermia), with tachypnea heart rate 21-31.  Hypotensive down to 56/34 improved initially with pressors Evidence of endorgan dysfunctionfailure and encephalopathy.   -Source of sepsis likely UTI vs cellulitis leg -Broad-spectrum antibiotics IV vancomycin cefepime  -Family has elected for DNR/DNI, no central lines, but otherwise with full scope of treatment -2.25 L bolus given, continue 1/2 N/s 125cc/hr x 1 day --Initial blood cultures Proteus pansensitive -Subsequent blood cultures negative to date -Urine culture greater than 100 K lactobacillus    -Sepsis physiology resolved - completed 10 days of IV antibiotic course

## 2022-07-13 NOTE — Assessment & Plan Note (Addendum)
-   Resolved -Satting 95% on room air O2 sats down to 72% on room air and tachypnea -likely a degree of hypoventilation and possible aspiration pneumonitis -Obtain ABG 7.37/30/175/17 on 2L -stable on 2L -DNR, DNI

## 2022-07-13 NOTE — ED Provider Notes (Signed)
Patient presents with signs and symptoms of septic shock, has been fluid resuscitated, family wishes DO NOT RESUSCITATE and DO NOT INTUBATE, they do not want to have central lines but will except IV fluids and antibiotics.  Discussed with hospitalist Dr. Mariea Clonts who will admit.  Multiorgan system failure with septic shock   Robert Hong, MD 07/13/22 1826

## 2022-07-13 NOTE — Assessment & Plan Note (Addendum)
Anion gap metabolic acidosis, anion gap of 17, serum bicarb of 16, likely secondary to severe lactic acidosis of 7.9, and azotemia creatinine of 6.9. -Hydrate and trend

## 2022-07-13 NOTE — Assessment & Plan Note (Signed)
UTI with sepsis septic shock.  UA with moderate leukocytes many bacteria. -Follow-up urine cultures

## 2022-07-13 NOTE — ED Triage Notes (Signed)
Pt to ER via EMS form home.  Pt was seen by family 8 days ago and when they came to see him today he was in the same chair and had been urinating on himself.  Pt is currently unresponsive, agonal respirations.  MD to bedside, per EMS pt's son reports they want CPR, but not much more.

## 2022-07-13 NOTE — Assessment & Plan Note (Addendum)
Troponin elevated at 206.  Likely demand ischemia from severe sepsis and hypovolemia.  EKG with T wave changes in lead I, aVL, V5, V6. - Trend troponin - Echo: Reviewed

## 2022-07-13 NOTE — Sepsis Progress Note (Signed)
Sepsis protocol monitored by [facility] 

## 2022-07-13 NOTE — Assessment & Plan Note (Addendum)
Improved with IV fluids. 

## 2022-07-13 NOTE — Assessment & Plan Note (Addendum)
-  Improved,--- but not to baseline Hard of hearing alert oriented x1  Encephalopathy was exacerbated  secondary to septic shock, hypovolemia, acute CVA and azotemia with BUN of 144.  Head CT without acute abnormality, shows chronic microvascular ischemic changes and cerebral volume loss.  Son reports likely undiagnosed dementia. -Status post IV antibiotics and IV fluids  -Status post speech evaluation on dysphagia diet

## 2022-07-13 NOTE — Progress Notes (Addendum)
Pharmacy Antibiotic Note  Robert Cardenas is a 88 y.o. male hx HTN, CKD admitted on 07/13/2022 with AMS/sepsis of unknown source.  Pharmacy has been consulted for Vancomycin and Cefepime dosing. First doses ordered in the ED. Patient noted to be in acute renal failure  Plan: Continue Cefepime 1gm IV q 24h 2.   Daily BMP to assess renal function and appropriateness to redose Vancomycin 3.   Vancomycin levels as indicated for target AUC 500-600 4. MRSA-PCR  Height: 5\' 9"  (175.3 cm) Weight: 65.8 kg (145 lb) IBW/kg (Calculated) : 70.7  Temp (24hrs), Avg:96.6 F (35.9 C), Min:96.6 F (35.9 C), Max:96.6 F (35.9 C)  Recent Labs  Lab 07/13/22 1446  CREATININE 6.90*    Estimated Creatinine Clearance: 6.1 mL/min (A) (by C-G formula based on SCr of 6.9 mg/dL (H)).    No Known Allergies  Microbiology results: 8/10 BCx: P 8/10 UCx: P  8/10 MRSA PCR: P  Thank you for allowing pharmacy to be a part of this patient's care.  09/12/22, PharmD Clinical Pharmacist 07/13/2022 3:48 PM

## 2022-07-13 NOTE — ED Notes (Signed)
Bair hugger removed due to the increase of the patient's temperature.

## 2022-07-14 ENCOUNTER — Inpatient Hospital Stay (HOSPITAL_COMMUNITY): Payer: Medicare Other

## 2022-07-14 ENCOUNTER — Inpatient Hospital Stay (HOSPITAL_COMMUNITY)
Admit: 2022-07-14 | Discharge: 2022-07-14 | Disposition: A | Discharge status: Home / Self Care | Payer: Medicare Other | Attending: Internal Medicine | Admitting: Internal Medicine

## 2022-07-14 DIAGNOSIS — R778 Other specified abnormalities of plasma proteins: Secondary | ICD-10-CM | POA: Diagnosis not present

## 2022-07-14 DIAGNOSIS — A419 Sepsis, unspecified organism: Secondary | ICD-10-CM | POA: Diagnosis not present

## 2022-07-14 DIAGNOSIS — Z7189 Other specified counseling: Secondary | ICD-10-CM

## 2022-07-14 DIAGNOSIS — R6521 Severe sepsis with septic shock: Secondary | ICD-10-CM

## 2022-07-14 DIAGNOSIS — E870 Hyperosmolality and hypernatremia: Secondary | ICD-10-CM | POA: Diagnosis not present

## 2022-07-14 DIAGNOSIS — R571 Hypovolemic shock: Secondary | ICD-10-CM

## 2022-07-14 DIAGNOSIS — R8281 Pyuria: Secondary | ICD-10-CM

## 2022-07-14 DIAGNOSIS — I9589 Other hypotension: Secondary | ICD-10-CM | POA: Diagnosis not present

## 2022-07-14 DIAGNOSIS — E872 Acidosis, unspecified: Secondary | ICD-10-CM

## 2022-07-14 DIAGNOSIS — E875 Hyperkalemia: Secondary | ICD-10-CM

## 2022-07-14 DIAGNOSIS — G9341 Metabolic encephalopathy: Secondary | ICD-10-CM | POA: Diagnosis not present

## 2022-07-14 DIAGNOSIS — N1832 Chronic kidney disease, stage 3b: Secondary | ICD-10-CM

## 2022-07-14 DIAGNOSIS — N179 Acute kidney failure, unspecified: Secondary | ICD-10-CM | POA: Diagnosis not present

## 2022-07-14 LAB — BLOOD CULTURE ID PANEL (REFLEXED) - BCID2

## 2022-07-14 LAB — CBC
HCT: 47.6 % (ref 39.0–52.0)
Hemoglobin: 14.4 g/dL (ref 13.0–17.0)
MCH: 33.4 pg (ref 26.0–34.0)
MCHC: 30.3 g/dL (ref 30.0–36.0)
MCV: 110.4 fL — ABNORMAL HIGH (ref 80.0–100.0)
Platelets: 93 10*3/uL — ABNORMAL LOW (ref 150–400)
RBC: 4.31 MIL/uL (ref 4.22–5.81)
RDW: 13.8 % (ref 11.5–15.5)
WBC: 33.4 10*3/uL — ABNORMAL HIGH (ref 4.0–10.5)
nRBC: 0.1 % (ref 0.0–0.2)

## 2022-07-14 LAB — ECHOCARDIOGRAM COMPLETE
AR max vel: 2.29 cm2
AV Area VTI: 2.51 cm2
AV Area mean vel: 2.44 cm2
AV Mean grad: 2 mmHg
AV Peak grad: 3.3 mmHg
Ao pk vel: 0.91 m/s
Area-P 1/2: 3.67 cm2
Height: 69 in
MV VTI: 2.18 cm2
S' Lateral: 2.6 cm
Weight: 2310.42 oz

## 2022-07-14 LAB — TROPONIN I (HIGH SENSITIVITY)
Troponin I (High Sensitivity): 447 ng/L (ref <18)
Troponin I (High Sensitivity): 499 ng/L (ref <18)

## 2022-07-14 LAB — GLUCOSE, CAPILLARY
Glucose-Capillary: 159 mg/dL — ABNORMAL HIGH (ref 70–99)
Glucose-Capillary: 155 mg/dL — ABNORMAL HIGH (ref 70–99)

## 2022-07-14 LAB — COMPREHENSIVE METABOLIC PANEL
ALT: 19 U/L (ref 0–44)
AST: 59 U/L — ABNORMAL HIGH (ref 15–41)
Albumin: 2.5 g/dL — ABNORMAL LOW (ref 3.5–5.0)
Alkaline Phosphatase: 58 U/L (ref 38–126)
Anion gap: 11 (ref 5–15)
BUN: 140 mg/dL — ABNORMAL HIGH (ref 8–23)
CO2: 21 mmol/L — ABNORMAL LOW (ref 22–32)
Calcium: 7.7 mg/dL — ABNORMAL LOW (ref 8.9–10.3)
Chloride: 130 mmol/L — ABNORMAL HIGH (ref 98–111)
Creatinine, Ser: 5.69 mg/dL — ABNORMAL HIGH (ref 0.61–1.24)
GFR, Estimated: 9 mL/min — ABNORMAL LOW (ref >60)
Glucose, Bld: 202 mg/dL — ABNORMAL HIGH (ref 70–99)
Potassium: 4.4 mmol/L (ref 3.5–5.1)
Sodium: 162 mmol/L (ref 135–145)
Total Bilirubin: 1.1 mg/dL (ref 0.3–1.2)
Total Protein: 5.6 g/dL — ABNORMAL LOW (ref 6.5–8.1)

## 2022-07-14 LAB — HEMOGLOBIN A1C
Hgb A1c MFr Bld: 6.3 % — ABNORMAL HIGH (ref 4.8–5.6)
Mean Plasma Glucose: 134.11 mg/dL

## 2022-07-14 LAB — PROCALCITONIN: Procalcitonin: 5.35 ng/mL

## 2022-07-14 LAB — T4, FREE: Free T4: 0.65 ng/dL (ref 0.61–1.12)

## 2022-07-14 LAB — LACTIC ACID, PLASMA: Lactic Acid, Venous: 2.7 mmol/L (ref 0.5–1.9)

## 2022-07-14 LAB — MRSA NEXT GEN BY PCR, NASAL: MRSA by PCR Next Gen: NOT DETECTED

## 2022-07-14 LAB — FOLATE: Folate: 8.4 ng/mL (ref >5.9)

## 2022-07-14 LAB — VITAMIN B12: Vitamin B-12: 66 pg/mL — ABNORMAL LOW (ref 180–914)

## 2022-07-14 LAB — TSH: TSH: 2.675 u[IU]/mL (ref 0.350–4.500)

## 2022-07-14 MED ORDER — ONDANSETRON HCL 4 MG/2ML IJ SOLN: 4.0000 mg | Freq: Four times a day (QID) | INTRAMUSCULAR | Status: DC | PRN

## 2022-07-14 MED ORDER — ONDANSETRON HCL 4 MG PO TABS: 4.0000 mg | ORAL_TABLET | Freq: Four times a day (QID) | ORAL | Status: DC | PRN

## 2022-07-14 MED ORDER — VANCOMYCIN HCL IN DEXTROSE 1-5 GM/200ML-% IV SOLN: 1000.0000 mg | Freq: Once | INTRAVENOUS | Status: DC

## 2022-07-14 MED ORDER — ACETAMINOPHEN 325 MG PO TABS: 650.0000 mg | ORAL_TABLET | Freq: Four times a day (QID) | ORAL | Status: DC | PRN

## 2022-07-14 MED ORDER — METRONIDAZOLE 500 MG/100ML IV SOLN: 500.0000 mg | Freq: Two times a day (BID) | INTRAVENOUS | Status: DC

## 2022-07-14 MED ORDER — SODIUM CHLORIDE 0.45 % IV SOLN: INTRAVENOUS | Status: DC

## 2022-07-14 MED ORDER — HEPARIN SODIUM (PORCINE) 5000 UNIT/ML IJ SOLN: 5000.0000 [IU] | Freq: Three times a day (TID) | INTRAMUSCULAR | Status: DC

## 2022-07-14 MED ORDER — NOREPINEPHRINE 4 MG/250ML-% IV SOLN: 2.0000 ug/min | INTRAVENOUS | Status: DC

## 2022-07-14 MED ORDER — POLYETHYLENE GLYCOL 3350 17 G PO PACK: 17.0000 g | PACK | Freq: Every day | ORAL | Status: DC | PRN

## 2022-07-14 MED ORDER — SODIUM CHLORIDE 0.9 % IV SOLN
250.0000 mL | INTRAVENOUS | Status: DC
  Administered 2022-07-14: 250 mL via INTRAVENOUS

## 2022-07-14 MED ORDER — CHLORHEXIDINE GLUCONATE CLOTH 2 % EX PADS
6.0000 | MEDICATED_PAD | Freq: Every day | CUTANEOUS | Status: DC
  Administered 2022-07-14 – 2022-07-21 (×8): 6 via TOPICAL

## 2022-07-14 MED ORDER — SODIUM CHLORIDE 0.9 % IV SOLN
250.0000 mL | INTRAVENOUS | Status: DC
  Administered 2022-07-21: 250 mL via INTRAVENOUS

## 2022-07-14 MED ORDER — NOREPINEPHRINE 4 MG/250ML-% IV SOLN
2.0000 ug/min | INTRAVENOUS | Status: DC
  Administered 2022-07-14: 2 ug/min via INTRAVENOUS
  Administered 2022-07-14: 5 ug/min via INTRAVENOUS
  Administered 2022-07-16: 4 ug/min via INTRAVENOUS
  Filled 2022-07-14 (×2): qty 250

## 2022-07-14 MED ORDER — HEPARIN SODIUM (PORCINE) 5000 UNIT/ML IJ SOLN
5000.0000 [IU] | Freq: Three times a day (TID) | INTRAMUSCULAR | Status: DC
  Administered 2022-07-14 – 2022-07-15 (×5): 5000 [IU] via SUBCUTANEOUS
  Filled 2022-07-14 (×5): qty 1

## 2022-07-14 MED ORDER — METRONIDAZOLE 500 MG/100ML IV SOLN
500.0000 mg | Freq: Two times a day (BID) | INTRAVENOUS | Status: DC
  Administered 2022-07-14 – 2022-07-15 (×3): 500 mg via INTRAVENOUS
  Filled 2022-07-14 (×3): qty 100

## 2022-07-14 MED ORDER — ACETAMINOPHEN 650 MG RE SUPP: 650.0000 mg | Freq: Four times a day (QID) | RECTAL | Status: DC | PRN

## 2022-07-14 NOTE — Consult Note (Addendum)
Cardiology Consultation:   Patient ID: Robert Cardenas MRN: [id number]; DOB: [birth year]  Admit date: 07/13/2022 Date of Consult: 07/14/2022  PCP:  Asencion Noble, MD   [place] Providers Cardiologist: Inpatient Evaluation by Dr. Domenic Polite in 2019 - No outpatient follow-up   Patient Profile:   Robert Cardenas is a 88 y.o. male with a hx of HTN, Stage 3 CKD and Gout who is being seen 07/14/2022 for the evaluation of elevated troponin values at the request of Dr. Carles Collet.  History of Present Illness:   Robert Cardenas presented to [facility] ED on 07/13/2022 for evaluation of AMS and worsening dyspnea. Family members reported going to visit with him and he was unresponsive and had urinated on himself (last known normal was 2 days prior).   Upon arrival to the ED, he was hypotensive at 56/34 and compressions were initially started due to concerns for no pulse but he received less than a minute of CPR as he was noted to have a faint palpable femoral pulse. Oxygen saturations were initially in the 70's on RA. Was also hypothermic at 94.3. EDP reached out to the patient's son who agreed to DNR status.   Initial labs showed WBC 20.5, Hgb 13.8, platelets 101, Na+ 163, K+ 5.3 and creatinine 6.07. Albumin 2.4. AST 39 and ALT 13. Lactic Acid 7.9 with repeats of 7.6, 5.9 and 2.7. Initial Hs Troponin 206 with repeat values of 231, 447 and 499. CXR showed bilateral opacities which may represent atelectasis, infection or inflammatory processes. CT Head showed chronic microvascular ischemic changes with no acute findings. EKG showed NSR, HR 77 with LAFB and TWI along the lateral leads which is new since 2019.  He was admitted for further management of septic shock in the setting of a UTI and hypovolemia. Was started on broad-spectrum antibiotics and IV fluids. Required Levophed for BP support.   This morning, the patient awakens to voice but does not answer questions appropriately so unable to contribute to history. In  talking with the patient's nurse, she reports they found maggots along his feet upon admission as well.   Past Medical History:  Diagnosis Date   Acute renal failure ([place])    04/2015   Gout    Hypertension     Past Surgical History:  Procedure Laterality Date   BALLOON DILATION N/A 05/12/2018   Procedure: BALLOON DILATION;  Surgeon: Rogene Houston, MD;  Location: [facility];  Service: Endoscopy;  Laterality: N/A;  dilated ampula to 10   CHOLECYSTECTOMY N/A 05/13/2018   Procedure: LAPAROSCOPIC CHOLECYSTECTOMY;  Surgeon: Virl Cagey, MD;  Location: [facility];  Service: General;  Laterality: N/A;   ERCP N/A 05/12/2018   Procedure: ENDOSCOPIC RETROGRADE CHOLANGIOPANCREATOGRAPHY (ERCP);  Surgeon: Rogene Houston, MD;  Location: [facility];  Service: Endoscopy;  Laterality: N/A;   PROSTATE SURGERY     REMOVAL OF STONES N/A 05/12/2018   Procedure: REMOVAL OF STONES;  Surgeon: Rogene Houston, MD;  Location: [facility];  Service: Endoscopy;  Laterality: N/A;   SPHINCTEROTOMY N/A 05/12/2018   Procedure: SPHINCTEROTOMY;  Surgeon: Rogene Houston, MD;  Location: [facility];  Service: Endoscopy;  Laterality: N/A;     Home Medications:  Prior to Admission medications   Medication Sig Start Date End Date Taking? Authorizing Provider  acetaminophen (TYLENOL) 325 MG tablet Take 2 tablets (650 mg total) by mouth every 6 (six) hours as needed for mild pain (or Fever >/= 101). 04/17/15   Willey Blade,  Channing Muttersoy, MD  allopurinol (ZYLOPRIM) 100 MG tablet Take 200 mg by mouth daily.    [provider]  amLODipine (NORVASC) 5 MG tablet Take 5 mg by mouth daily.    [provider]  aspirin EC 81 MG tablet Take 81 mg by mouth daily.    [provider]  doxazosin (CARDURA) 8 MG tablet Take 4 mg by mouth daily.     [provider]  vitamin C (ASCORBIC ACID) 500 MG tablet Take 500 mg by mouth daily.    [provider]    Inpatient Medications: Scheduled Meds:   Chlorhexidine Gluconate Cloth  6 each Topical Q0600   heparin  5,000 Units Subcutaneous Q8H   vancomycin variable dose per unstable renal function (pharmacist dosing)   Does not apply See admin instructions   Continuous Infusions:  sodium chloride 125 mL/hr at 07/14/22 0835   sodium chloride Stopped (07/14/22 0730)   sodium chloride Stopped (07/14/22 0102)   ceFEPime (MAXIPIME) IV     metronidazole Stopped (07/14/22 0737)   norepinephrine (LEVOPHED) Adult infusion 4 mcg/min (07/14/22 0835)   [START ON 07/15/2022] vancomycin     PRN Meds: acetaminophen **OR** acetaminophen, ondansetron **OR** ondansetron (ZOFRAN) IV, polyethylene glycol  Allergies:   No Known Allergies  Social History:   Social History   Socioeconomic History   Marital status: Widowed    Spouse name: Not on file   Number of children: Not on file   Years of education: Not on file   Highest education level: Not on file  Occupational History   Not on file  Tobacco Use   Smoking status: Never   Smokeless tobacco: Never  Substance and Sexual Activity   Alcohol use: No   Drug use: No   Sexual activity: Not Currently  Other Topics Concern   Not on file  Social History Narrative   Not on file   Social Determinants of Health   Financial Resource Strain: Not on file  Food Insecurity: Not on file  Transportation Needs: Not on file  Physical Activity: Not on file  Stress: Not on file  Social Connections: Not on file  Intimate Partner Violence: Not on file    Family History:   History reviewed. No pertinent family history. Patient unable to contribute at this time.   ROS:  Please see the history of present illness.   All other ROS reviewed and negative.     Physical Exam/Data:   Vitals:   07/14/22 0825 07/14/22 0831 07/14/22 0930 07/14/22 1000  BP: (!) 127/93 124/89 98/73 104/67  Pulse:  91  97  Resp:  (!) 22 19 (!) 22  Temp:      TempSrc:      SpO2:  98% 90% 95%  Weight:      Height:         Intake/Output Summary (Last 24 hours) at 07/14/2022 1049 Last data filed at 07/14/2022 0900 Gross per 24 hour  Intake 3931.5 ml  Output 125 ml  Net 3806.5 ml      07/14/2022    2:00 AM 07/13/2022    3:38 PM 07/27/2020    4:31 PM  Last 3 Weights  Weight (lbs) 144 lb 6.4 oz 145 lb 159 lb  Weight (kg) 65.5 kg 65.772 kg 72.122 kg     Body mass index is 21.32 kg/m.  General: Frail, elderly male appearing in no acute distress.  HEENT: normal Neck: no JVD Vascular: No carotid bruits; Distal pulses 2+ bilaterally Cardiac:  normal S1, S2; RRR; no murmur  Lungs:  clear to auscultation bilaterally, no wheezing, rhonchi or rales  Abd: soft, nontender, no hepatomegaly  Ext: 1+ pitting edema bilaterally.  Musculoskeletal:  No deformities, BUE and BLE strength normal and equal Skin: warm and dry  Psych: Awakens to voice but will not answer questions appropriately.   EKG:  The EKG was personally reviewed and demonstrates: NSR, HR 77 with LAFB and TWI along the lateral leads which is new since 2019.  Telemetry:  Telemetry was personally reviewed and demonstrates: NSR, HR in 70's to 80's. Brief episodes of tachycardia with HR in the 140's, appearing most consistent with an atrial tach.   Relevant CV Studies:  Echocardiogram: 05/2018 Study Conclusions   - Left ventricle: The cavity size was normal. Systolic function was    normal. The estimated ejection fraction was in the range of 60%    to 65%. Wall motion was normal; there were no regional wall    motion abnormalities. Features are consistent with a pseudonormal    left ventricular filling pattern, with concomitant abnormal    relaxation and increased filling pressure (grade 2 diastolic    dysfunction). Doppler parameters are consistent with high    ventricular filling pressure. Mild focal basal septal    hypertrophy.  - Aortic valve: Trileaflet; mildly thickened leaflets.  - Mitral valve: There was mild to moderate regurgitation.   - Tricuspid valve: There was mild-moderate regurgitation.  - Pulmonic valve: There was trivial regurgitation.  - Pulmonary arteries: PA peak pressure: 49 mm Hg (S).   Laboratory Data:  High Sensitivity Troponin:   Recent Labs  Lab 07/13/22 1639 07/13/22 2020 07/14/22 0359 07/14/22 0701  TROPONINIHS 206* 231* 447* 499*     Chemistry Recent Labs  Lab 07/13/22 1446 07/13/22 1639 07/14/22 0312  NA 161* 163* 162*  K 5.7* 5.3* 4.4  CL 129* 130* 130*  CO2  --  16* 21*  GLUCOSE 271* 196* 202*  BUN >130* 144* 140*  CREATININE 6.90* 6.07* 5.69*  CALCIUM  --  7.8* 7.7*  MG  --  1.9  --   GFRNONAA  --  8* 9*  ANIONGAP  --  17* 11    Recent Labs  Lab 07/13/22 1639 07/14/22 0312  PROT 5.4* 5.6*  ALBUMIN 2.4* 2.5*  AST 39 59*  ALT 13 19  ALKPHOS 63 58  BILITOT 1.2 1.1   Lipids No results for input(s): "CHOL", "TRIG", "HDL", "LABVLDL", "LDLCALC", "CHOLHDL" in the last 168 hours.  Hematology Recent Labs  Lab 07/13/22 1446 07/13/22 1639 07/14/22 0312  WBC  --  20.5* 33.4*  RBC  --  4.05* 4.31  HGB 17.7* 13.8 14.4  HCT 52.0 45.1 47.6  MCV  --  111.4* 110.4*  MCH  --  34.1* 33.4  MCHC  --  30.6 30.3  RDW  --  13.7 13.8  PLT  --  101* 93*   Thyroid  Recent Labs  Lab 07/14/22 0702  TSH 2.675    BNPNo results for input(s): "BNP", "PROBNP" in the last 168 hours.  DDimer No results for input(s): "DDIMER" in the last 168 hours.   Radiology/Studies:  CT Head Wo Contrast  Result Date: 07/13/2022 CLINICAL DATA:  Altered mental status EXAM: CT HEAD WITHOUT CONTRAST TECHNIQUE: Contiguous axial images were obtained from the base of the skull through the vertex without intravenous contrast. RADIATION DOSE REDUCTION: This exam was performed according to the departmental dose-optimization program which includes automated exposure control,  adjustment of the mA and/or kV according to patient size and/or use of iterative reconstruction technique. COMPARISON:  None Available.  FINDINGS: Brain: No evidence of acute infarction, hemorrhage, hydrocephalus, extra-axial collection or mass lesion/mass effect. Patchy low-density changes within the periventricular and subcortical white matter compatible with chronic microvascular ischemic change. Mild diffuse cerebral volume loss. Vascular: Atherosclerotic calcifications involving the large vessels of the skull base. No unexpected hyperdense vessel. Skull: Normal. Negative for fracture or focal lesion. Sinuses/Orbits: No acute finding. Other: None. IMPRESSION: 1. No acute intracranial abnormality. 2. Chronic microvascular ischemic change and cerebral volume loss. Electronically Signed   By: Duanne Guess D.O.   On: 07/13/2022 16:13   DG Chest Port 1 View  Result Date: 07/13/2022 CLINICAL DATA:  Concern for sepsis, altered mental status EXAM: PORTABLE CHEST 1 VIEW COMPARISON:  05/10/2018, 02/13/2016 FINDINGS: Unchanged cardiac and mediastinal contours. Aortic atherosclerosis. Left retrocardiac and right basilar heterogeneous opacities. No pleural effusion or pneumothorax. No acute osseous abnormality. Degenerative changes in the right-greater-than-left shoulder. IMPRESSION: Left retrocardiac and right basilar heterogeneous opacities, which are nonspecific and may represent atelectasis, infection, or inflammatory process. Electronically Signed   By: Wiliam Ke M.D.   On: 07/13/2022 15:15     Assessment and Plan:   1. Elevated Troponin Values - He is currently admitted with septic shock in the setting of UTI and hypovolemia and found to have troponin elevation with values at 206, 231, 447 and 499. EKG does show normal sinus rhythm with TWI along the lateral leads which is new when compared to prior tracings. - Suspect his enzyme elevation is secondary to demand ischemia in the setting of possible rhabdo due to unknown downtime and his significant AKI and electrolyte abnormalities. An echocardiogram is pending for reassessment of his  EF and wall motion but would not anticipate further ischemic testing in the setting of his multiple medical issues and advanced age. Do not see a strong indication to start Heparin and especially in the setting of his thrombocytopenia.  2. Septic Shock - He was significantly hypothermic and hypotensive upon arrival and has been started on broad-spectrum antibiotics along with IV fluids and also Levophed. Levophed was discontinued this morning but will likely need to be restarted as SBP is currently in the 80's. Lactic Acid peaked at 7.9 but is down-trending. Management per the admitting team  3. AKI/Hypernatremia - Creatinine was elevated to 6.9 on admission, improved to 5.69 today. Na+ remains elevated at 162. Continues to receive IV fluids.   4. GOC - He was made DNR by family on admission but will need to continue to have goals of care discussions. He was also malnourished on admission and was found to have maggots along his feet. If he survives this admission, will likely need placement at the time of discharge.   For questions or updates, please contact [facility] Please consult [email or web address] for contact info under    Signed, Ellsworth Lennox, PA-C  07/14/2022 10:49 AM   Attending note  Patient seen and discussed with PA Iran Ouch, I agree with her documentation. 88 yo male history of HTN found at home unresponsive with agonal respirations. Last seen normal 2 days prior, patient lives by himself. Presented to ER hypotensive 56/34, hypothermic, hypoxic in the 70s. Initially started on vasopressors, able to wean with aggressive IVFs.     Lactic acid 7.9-->7.6-->5.9  Na 161 K 5.7 BUN 144 Cr 6.9 (1.6  baseline 4 years ago) WBC 20.5 Hgb 13.9 Plt 101 Mg 1.9  bicab 16 Procalc 5.35 Trop 206-->231-->447-->499--> EKG SR, new lateral TWIs ABG 7.37/30/175/17 Blood cx's: gram neg rods  CXR left retrocardiac and right basilar opacities CT head: no acute process   1.Septic  shock -presented hypotensive, WBC 20, multiorgan failure - transiently on pressors, bp's improved with aggressive IVFs - blood cultures gram neg rods.  - abx per primary team   2. Elevated troponin - in setting of septic shock, hypoxia, marked lactic acidosis -trop up to 499, EKG lateral TWIs - echo normal LVEF, no significant WMAs - this is demand ischemia, no plans for ischemic testing.  - does not require anticoagluation   No plans for further cardiac testing at this time, does not need cardiology f/u. We will sign off inpatient care.   Dina Rich MD

## 2022-07-14 NOTE — Plan of Care (Signed)

## 2022-07-14 NOTE — Hospital Course (Signed)
Robert Cardenas is a 88 year old male with a history of CKD, hypertension, prostate cancer status post brachytherapy, gouty arthritis presented when he the patient was found unresponsive at home with agonal respirations.  The patient remained somnolent and obtunded in the emergency department and was unable to provide any significant history.  History is obtained from review of the medical record and speaking with the patient's son.  According to the patient's son, the patient was last seen well approximately 2 days prior to this admission.  Apparently, there has been concern about the patient having cognitive impairment however the patient has been stubborn in terms of receiving medical care.  The patient lives by himself and has poor oral intake.  Apparently, the patient lives on a "tea and toast diet".  On initial arrival in the emergency department, nursing staff was concerned that they were unable to palpate a pulse and initiated CPR with chest compressions for less than 1 minute.  Subsequently, the patient was felt to have a faint femoral pulse with a blood pressure of 56/34.  Vasopressors were initially started.  The patient was given 2 L fluid boluses after which he was able to be weaned off vasopressors initially. The patient had low-grade temperature 100.4 F with tachypnea and hypotension initially.  WBC 20.5, hemoglobin 13.8, platelets 101,000.  Sodium 163, potassium 5.3, bicarbonate 16, BUN 144, serum creatinine 6.07.  Lactic acid peaked at 7.9>> 2.7.  CT brain was negative.  Chest x-ray showed bibasilar atelectasis, right greater than left.  ABG initially showed 7.3 7/30/175/17 on 2 L.  Troponins were 206>> 231.  EKG showed sinus rhythm and nonspecific T wave changes in the lateral leads.  UA showed 21-50 WBC,>50RBC.  The patient was started on vancomycin, cefepime, metronidazole.

## 2022-07-14 NOTE — Progress Notes (Signed)
Lab called positive blood cultures @ 1115: Anaerobic bottles positive for gram negative rods. Dr. Arbutus Leas notified. Patient is receiving antibiotics.

## 2022-07-14 NOTE — Progress Notes (Signed)
EEG complete - results pending 

## 2022-07-14 NOTE — Assessment & Plan Note (Addendum)
Improved

## 2022-07-14 NOTE — Assessment & Plan Note (Addendum)
Due to sepsis and hypoxia -Resolved status post IV fluid resuscitation

## 2022-07-14 NOTE — Assessment & Plan Note (Addendum)
UA with 21-50WBC -Urine culture greater than 100 K lactobacillus species Continue empiric antibiotics-completed course of antibiotics

## 2022-07-14 NOTE — Progress Notes (Signed)
Lab called critical troponin of 499 at 0922. Dr. Arbutus Leas notified. Cardiology consult pending.

## 2022-07-14 NOTE — Progress Notes (Addendum)
PROGRESS NOTE  Robert Cardenas NIO:[id number] DOB: 1926-01-30 DOA: 07/13/2022 PCP: Carylon Perches, MD  Brief History:  88 year old male with a history of CKD, hypertension, prostate cancer status post brachytherapy, gouty arthritis presented when he the patient was found unresponsive at home with agonal respirations.  The patient remained somnolent and obtunded in the emergency department and was unable to provide any significant history.  History is obtained from review of the medical record and speaking with the patient's son.  According to the patient's son, the patient was last seen well approximately 2 days prior to this admission.  Apparently, there has been concern about the patient having cognitive impairment however the patient has been stubborn in terms of receiving medical care.  The patient lives by himself and has poor oral intake.  Apparently, the patient lives on a "tea and toast diet".  On initial arrival in the emergency department, nursing staff was concerned that they were unable to palpate a pulse and initiated CPR with chest compressions for less than 1 minute.  Subsequently, the patient was felt to have a faint femoral pulse with a blood pressure of 56/34.  Vasopressors were initially started.  The patient was given 2 L fluid boluses after which he was able to be weaned off vasopressors initially. The patient had low-grade temperature 100.4 F with tachypnea and hypotension initially.  WBC 20.5, hemoglobin 13.8, platelets 101,000.  Sodium 163, potassium 5.3, bicarbonate 16, BUN 144, serum creatinine 6.07.  Lactic acid peaked at 7.9>> 2.7.  CT brain was negative.  Chest x-ray showed bibasilar atelectasis, right greater than left.  ABG initially showed 7.3 7/30/175/17 on 2 L.  Troponins were 206>> 231.  EKG showed sinus rhythm and nonspecific T wave changes in the lateral leads.  UA showed 21-50 WBC,>50RBC.  The patient was started on vancomycin, cefepime, metronidazole.     Assessment and Plan: * Septic shock (HCC) Patient found unresponsive, meeting sepsis criteria with hypothermia- temperature down to 94.3,  leukocytosis of 20, tachycardia heart rate ranging from 25-134 (bradycardia may be secondary to hypothermia), with tachypnea heart rate 21-31.  Hypotensive down to 56/34 improved initially with pressors and now with fluids.  Evidence of endorgan dysfunctionfailure and encephalopathy.   -Source of sepsis likely UTI  -Broad-spectrum antibiotics IV vancomycin cefepime  -Family has elected for DNR/DNI, no central lines, but otherwise with full scope of treatment -2.25 L bolus given, continue 1/2 N/s 125cc/hr x 1 day -Follow-up blood and urine cultures -ABG 7.37/30/175/17 on 2L Check PCT  Acute metabolic encephalopathy Likely secondary to septic shock, hypovolemia and azotemia with BUN of 144.  Head CT without acute abnormality, shows chronic microvascular ischemic changes and cerebral volume loss.  Son reports likely undiagnosed dementia. -IV antibiotics and IV fluids -Remain n.p.o. - CBG Q8 hourly-   Elevated troponin Troponin elevated at 206.  Likely demand ischemia from severe sepsis and hypovolemia.  EKG with T wave changes in lead I, aVL, V5, V6. - Trend troponin - Echo  Hypernatremia Sodium 163 on admission Start hypotonic fluid  Goals of care, counseling/discussion Son confirms DNR/DNI  Pyuria UA with 21-50WBC Follow urine culture Continue empiric antibiotics  Lactic acidosis Due to sepsis and hypoxia Continue IVF  Acute renal failure superimposed on stage 3b chronic kidney disease (HCC) Baseline creatinine 1.4-1.6 Presented with serum creatinine 6.07 Due to hemodynamic changes/ATN, volume depletion  Acute respiratory failure with hypoxia (HCC) O2 sats down to 72% on room  air and tachypnea -likely a degree of hypoventilation and possible aspiration pneumonitis -Obtain ABG 7.37/30/175/17 on 2L -stable on 2L -DNR, DNI -Right  lower extremity venous Dopplers  Hyperkalemia Anticipate improvement with IVF Remain on tele  HTN (hypertension) History of hypertension.  Blood pressure initially 56/34, initially required pressors  -Continue Levophed for MAP less than 65 -IVF Fluids -Hold Norvasc, doxazosin       Family Communication: son updated 8/11  Consultants:  none  Code Status:  DNR  DVT Prophylaxis:  [place] Heparin   Procedures: As Listed in Progress Note Above  Antibiotics: Vanc 8/10>> Cefepime 8/10>>   The patient is critically ill with multiple organ systems failure and requires high complexity decision making for assessment and support, frequent evaluation and titration of therapies, application of advanced monitoring technologies and extensive interpretation of multiple databases.  Critical care time - 45 mins.     Subjective: Pt awakens to voice. Occasionally answers yes/no.  No vomiting, diarrhea. Respiratory distress  Objective: Vitals:   07/14/22 0500 07/14/22 0530 07/14/22 0600 07/14/22 0612  BP: (!) 140/68 (!) 125/106 (!) 61/42 (!) 97/54  Pulse:      Resp: 20 (!) 24 18 16   Temp:      TempSrc:      SpO2:      Weight:      Height:        Intake/Output Summary (Last 24 hours) at 07/14/2022 0648 Last data filed at 07/14/2022 [id number] Gross per 24 hour  Intake 3629.78 ml  Output 125 ml  Net 3504.78 ml   Weight change:  Exam:  General:  Pt is alert, does not follow commands appropriately, not in acute distress HEENT: No icterus, No thrush, No neck mass, [place]/AT Cardiovascular: RRR, S1/S2, no rubs, no gallops Respiratory: diminished BS;  no wheeze Abdomen: Soft/+BS, non tender, non distended, no guarding Extremities: 2 +LE edema, erythema right ankle and leg.  No pus, no crepitance.   Data Reviewed: I have personally reviewed following labs and imaging studies Basic Metabolic Panel: Recent Labs  Lab 07/13/22 1446 07/13/22 1639 07/14/22 0312  NA 161* 163* 162*  K 5.7*  5.3* 4.4  CL 129* 130* 130*  CO2  --  16* 21*  GLUCOSE 271* 196* 202*  BUN >130* 144* 140*  CREATININE 6.90* 6.07* 5.69*  CALCIUM  --  7.8* 7.7*  MG  --  1.9  --   PHOS  --  2.0*  --    Liver Function Tests: Recent Labs  Lab 07/13/22 1639 07/14/22 0312  AST 39 59*  ALT 13 19  ALKPHOS 63 58  BILITOT 1.2 1.1  PROT 5.4* 5.6*  ALBUMIN 2.4* 2.5*   No results for input(s): "LIPASE", "AMYLASE" in the last 168 hours. No results for input(s): "AMMONIA" in the last 168 hours. Coagulation Profile: Recent Labs  Lab 07/13/22 1639  INR 1.4*   CBC: Recent Labs  Lab 07/13/22 1446 07/13/22 1639 07/14/22 0312  WBC  --  20.5* 33.4*  NEUTROABS  --  19.6*  --   HGB 17.7* 13.8 14.4  HCT 52.0 45.1 47.6  MCV  --  111.4* 110.4*  PLT  --  101* 93*   Cardiac Enzymes: No results for input(s): "CKTOTAL", "CKMB", "CKMBINDEX", "TROPONINI" in the last 168 hours. BNP: Invalid input(s): "POCBNP" CBG: Recent Labs  Lab 07/13/22 1445  GLUCAP 260*   HbA1C: No results for input(s): "HGBA1C" in the last 72 hours. Urine analysis:    Component Value Date/Time  COLORURINE AMBER (A) 07/13/2022 1639   APPEARANCEUR HAZY (A) 07/13/2022 1639   LABSPEC 1.017 07/13/2022 1639   PHURINE 5.0 07/13/2022 1639   GLUCOSEU NEGATIVE 07/13/2022 1639   HGBUR LARGE (A) 07/13/2022 1639   BILIRUBINUR NEGATIVE 07/13/2022 1639   KETONESUR NEGATIVE 07/13/2022 1639   PROTEINUR 100 (A) 07/13/2022 1639   UROBILINOGEN 0.2 04/15/2015 0947   NITRITE NEGATIVE 07/13/2022 1639   LEUKOCYTESUR MODERATE (A) 07/13/2022 1639   Sepsis Labs: @LABRCNTIP (procalcitonin:4,lacticidven:4) ) Recent Results (from the past 240 hour(s))  Resp Panel by RT-PCR (Flu A&B, Covid) Anterior Nasal Swab     Status: None   Collection Time: 07/13/22  4:39 PM   Specimen: Anterior Nasal Swab  Result Value Ref Range Status   SARS Coronavirus 2 by RT PCR NEGATIVE NEGATIVE Final    Comment: (NOTE) SARS-CoV-2 target nucleic acids are NOT  DETECTED.  The SARS-CoV-2 RNA is generally detectable in upper respiratory specimens during the acute phase of infection. The lowest concentration of SARS-CoV-2 viral copies this assay can detect is 138 copies/mL. A negative result does not preclude SARS-Cov-2 infection and should not be used as the sole basis for treatment or other patient management decisions. A negative result may occur with  improper specimen collection/handling, submission of specimen other than nasopharyngeal swab, presence of viral mutation(s) within the areas targeted by this assay, and inadequate number of viral copies(<138 copies/mL). A negative result must be combined with clinical observations, patient history, and epidemiological information. The expected result is Negative.  Fact Sheet for Patients:  EntrepreneurPulse.com.au  Fact Sheet for Healthcare Providers:  IncredibleEmployment.be  This test is no t yet approved or cleared by the [place] FDA and  has been authorized for detection and/or diagnosis of SARS-CoV-2 by FDA under an Emergency Use Authorization (EUA). This EUA will remain  in effect (meaning this test can be used) for the duration of the COVID-19 declaration under Section 564(b)(1) of the Act, 21 U.S.C.section 360bbb-3(b)(1), unless the authorization is terminated  or revoked sooner.       Influenza A by PCR NEGATIVE NEGATIVE Final   Influenza B by PCR NEGATIVE NEGATIVE Final    Comment: (NOTE) The Xpert Xpress SARS-CoV-2/FLU/RSV plus assay is intended as an aid in the diagnosis of influenza from Nasopharyngeal swab specimens and should not be used as a sole basis for treatment. Nasal washings and aspirates are unacceptable for Xpert Xpress SARS-CoV-2/FLU/RSV testing.  Fact Sheet for Patients: EntrepreneurPulse.com.au  Fact Sheet for Healthcare Providers: IncredibleEmployment.be  This test is not yet  approved or cleared by the [place] FDA and has been authorized for detection and/or diagnosis of SARS-CoV-2 by FDA under an Emergency Use Authorization (EUA). This EUA will remain in effect (meaning this test can be used) for the duration of the COVID-19 declaration under Section 564(b)(1) of the Act, 21 U.S.C. section 360bbb-3(b)(1), unless the authorization is terminated or revoked.  Performed at [facility], [address], [place], [place] [address]   Blood Culture (routine x 2)     Status: None (Preliminary result)   Collection Time: 07/13/22  4:39 PM   Specimen: BLOOD  Result Value Ref Range Status   Specimen Description BLOOD LEFT ANTECUBITAL  Final   Special Requests   Final    BOTTLES DRAWN AEROBIC AND ANAEROBIC Blood Culture adequate volume   Culture   Final    NO GROWTH < 12 HOURS Performed at [facility], [address] [place], [place] [address]    Report Status PENDING  Incomplete  Blood Culture (routine x 2)     Status: None (Preliminary result)   Collection Time: 07/13/22  5:51 PM   Specimen: BLOOD LEFT HAND  Result Value Ref Range Status   Specimen Description BLOOD LEFT HAND  Final   Special Requests   Final    BOTTLES DRAWN AEROBIC ONLY Blood Culture results may not be optimal due to an inadequate volume of blood received in culture bottles   Culture   Final    NO GROWTH < 12 HOURS Performed at [facility], [address], [place], [place] [address]    Report Status PENDING  Incomplete     Scheduled Meds:  Chlorhexidine Gluconate Cloth  6 each Topical Q0600   heparin  5,000 Units Subcutaneous Q8H   vancomycin variable dose per unstable renal function (pharmacist dosing)   Does not apply See admin instructions   Continuous Infusions:  sodium chloride 250 mL (07/14/22 0033)   sodium chloride Stopped (07/14/22 0102)   ceFEPime (MAXIPIME) IV     metronidazole 500 mg (07/14/22 0540)   norepinephrine (LEVOPHED) Adult infusion 5 mcg/min (07/14/22 0609)     Procedures/Studies: CT Head Wo Contrast  Result Date: 07/13/2022 CLINICAL DATA:  Altered mental status EXAM: CT HEAD WITHOUT CONTRAST TECHNIQUE: Contiguous axial images were obtained from the base of the skull through the vertex without intravenous contrast. RADIATION DOSE REDUCTION: This exam was performed according to the departmental dose-optimization program which includes automated exposure control, adjustment of the mA and/or kV according to patient size and/or use of iterative reconstruction technique. COMPARISON:  None Available. FINDINGS: Brain: No evidence of acute infarction, hemorrhage, hydrocephalus, extra-axial collection or mass lesion/mass effect. Patchy low-density changes within the periventricular and subcortical white matter compatible with chronic microvascular ischemic change. Mild diffuse cerebral volume loss. Vascular: Atherosclerotic calcifications involving the large vessels of the skull base. No unexpected hyperdense vessel. Skull: Normal. Negative for fracture or focal lesion. Sinuses/Orbits: No acute finding. Other: None. IMPRESSION: 1. No acute intracranial abnormality. 2. Chronic microvascular ischemic change and cerebral volume loss. Electronically Signed   By: Duanne Guess D.O.   On: 07/13/2022 16:13   DG Chest Port 1 View  Result Date: 07/13/2022 CLINICAL DATA:  Concern for sepsis, altered mental status EXAM: PORTABLE CHEST 1 VIEW COMPARISON:  05/10/2018, 02/13/2016 FINDINGS: Unchanged cardiac and mediastinal contours. Aortic atherosclerosis. Left retrocardiac and right basilar heterogeneous opacities. No pleural effusion or pneumothorax. No acute osseous abnormality. Degenerative changes in the right-greater-than-left shoulder. IMPRESSION: Left retrocardiac and right basilar heterogeneous opacities, which are nonspecific and may represent atelectasis, infection, or inflammatory process. Electronically Signed   By: Wiliam Ke M.D.   On: 07/13/2022 15:15     Catarina Hartshorn, DO  [facility]  If 7PM-7AM, please contact night-coverage [email or web address] Password [facility] 07/14/2022, 6:48 AM   LOS: 1 day

## 2022-07-14 NOTE — Assessment & Plan Note (Signed)
Son confirms DNR/DNI

## 2022-07-14 NOTE — Assessment & Plan Note (Addendum)
Baseline creatinine 1.4-1.6 Presented with serum creatinine 6.07 Due to hemodynamic changes/ATN, volume depletion Lab Results  Component Value Date   CREATININE 2.75 (H) 07/21/2022   CREATININE 2.86 (H) 07/20/2022   CREATININE 3.20 (H) 07/19/2022

## 2022-07-14 NOTE — Progress Notes (Signed)
*  PRELIMINARY RESULTS* Echocardiogram 2D Echocardiogram has been performed.  Robert Cardenas 07/14/2022, 11:07 AM

## 2022-07-14 NOTE — Progress Notes (Signed)
Initial Nutrition Assessment  DOCUMENTATION CODES:      INTERVENTION:  When patient is cleared for diet advancement RD will address.     NUTRITION DIAGNOSIS:   Inadequate oral intake related to inability to eat as evidenced by NPO status.   GOAL:  Provide needs based on ASPEN/SCCM guidelines   MONITOR:  Diet advancement, Labs, I & O's, Weight trends, PO intake  REASON FOR ASSESSMENT:   Malnutrition Screening Tool    ASSESSMENT: Patient is a 88 yo male from home with hx of CKD, prostate cancer, HTN, gout. Presents after being found unresponsive. Septic shock, acute metabolic encephalopathy, hypernatremia.    Patient NPO. RD will follow for diet advancement. Talked with nursing. Patient has not been awake today. Green mitts. Unable to provide history at the moment and no family bedside. BLE edema.   Medications and weight reviewed.       Latest Ref Rng & Units 07/14/2022    3:12 AM 07/13/2022    4:39 PM 07/13/2022    2:46 PM  BMP  Glucose 70 - 99 mg/dL 588  502  774   BUN 8 - 23 mg/dL 128  786  >767   Creatinine 0.61 - 1.24 mg/dL 2.09  4.70  9.62   Sodium 135 - 145 mmol/L 162  163  161   Potassium 3.5 - 5.1 mmol/L 4.4  5.3  5.7   Chloride 98 - 111 mmol/L 130  130  129   CO2 22 - 32 mmol/L 21  16    Calcium 8.9 - 10.3 mg/dL 7.7  7.8       NUTRITION - FOCUSED PHYSICAL EXAM: Unable to complete Nutrition-Focused physical exam at this time.    Diet Order:   Diet Order             Diet NPO time specified  Diet effective now                   EDUCATION NEEDS:  Not appropriate for education at this time  Skin:  Skin Assessment: Skin Integrity Issues: Skin Integrity Issues::  (right ankle, left heel and buttocks)  Last BM:  8/11 type 7  Height:   Ht Readings from Last 1 Encounters:  07/14/22 5\' 9"  (1.753 m)    Weight:   Wt Readings from Last 1 Encounters:  07/14/22 65.5 kg    Ideal Body Weight:   73 kg  BMI:  Body mass index is 21.32  kg/m.  Estimated Nutritional Needs:   Kcal:  1800-2000  Protein:  88-92 gr  Fluid:  2 liters daily  09/13/22 MS,RD,CSG,LDN Contact: Royann Shivers

## 2022-07-14 NOTE — Progress Notes (Signed)
  Transition of Care [facility]) Screening Note   Patient Details  Name: Robert Cardenas Date of Birth: 1926/02/05   Transition of Care [facility]) CM/SW Contact:    Villa Herb, LCSWA Phone Number: 07/14/2022, 11:39 AM  TOC consulted for SNF/DME/HH needs. TOC to continue following pts case throughout hospital stay. TOC to follow and assess once pt is able to have PT/OT eval and more alert for assessment.   Transition of Care Department [facility]) has reviewed patient and no TOC needs have been identified at this time. We will continue to monitor patient advancement through interdisciplinary progression rounds. If new patient transition needs arise, please place a TOC consult.

## 2022-07-15 ENCOUNTER — Encounter (HOSPITAL_COMMUNITY): Payer: Self-pay | Admitting: Internal Medicine

## 2022-07-15 ENCOUNTER — Inpatient Hospital Stay (HOSPITAL_COMMUNITY): Payer: Medicare Other

## 2022-07-15 DIAGNOSIS — R778 Other specified abnormalities of plasma proteins: Secondary | ICD-10-CM | POA: Diagnosis not present

## 2022-07-15 DIAGNOSIS — G9341 Metabolic encephalopathy: Secondary | ICD-10-CM | POA: Diagnosis not present

## 2022-07-15 DIAGNOSIS — R4182 Altered mental status, unspecified: Secondary | ICD-10-CM

## 2022-07-15 DIAGNOSIS — N179 Acute kidney failure, unspecified: Secondary | ICD-10-CM | POA: Diagnosis not present

## 2022-07-15 DIAGNOSIS — A419 Sepsis, unspecified organism: Secondary | ICD-10-CM | POA: Diagnosis not present

## 2022-07-15 DIAGNOSIS — I10 Essential (primary) hypertension: Secondary | ICD-10-CM

## 2022-07-15 LAB — COMPREHENSIVE METABOLIC PANEL
ALT: 25 U/L (ref 0–44)
AST: 68 U/L — ABNORMAL HIGH (ref 15–41)
Albumin: 2.2 g/dL — ABNORMAL LOW (ref 3.5–5.0)
Alkaline Phosphatase: 55 U/L (ref 38–126)
Anion gap: 13 (ref 5–15)
BUN: 142 mg/dL — ABNORMAL HIGH (ref 8–23)
CO2: 14 mmol/L — ABNORMAL LOW (ref 22–32)
Calcium: 7.4 mg/dL — ABNORMAL LOW (ref 8.9–10.3)
Chloride: 129 mmol/L — ABNORMAL HIGH (ref 98–111)
Creatinine, Ser: 5.49 mg/dL — ABNORMAL HIGH (ref 0.61–1.24)
GFR, Estimated: 9 mL/min — ABNORMAL LOW (ref >60)
Glucose, Bld: 172 mg/dL — ABNORMAL HIGH (ref 70–99)
Potassium: 4.2 mmol/L (ref 3.5–5.1)
Sodium: 156 mmol/L — ABNORMAL HIGH (ref 135–145)
Total Bilirubin: 1.3 mg/dL — ABNORMAL HIGH (ref 0.3–1.2)
Total Protein: 5 g/dL — ABNORMAL LOW (ref 6.5–8.1)

## 2022-07-15 LAB — CBC WITH DIFFERENTIAL/PLATELET
Abs Immature Granulocytes: 0.1 10*3/uL — ABNORMAL HIGH (ref 0.00–0.07)
Basophils Absolute: 0 10*3/uL (ref 0.0–0.1)
Basophils Relative: 0 %
Eosinophils Absolute: 0 10*3/uL (ref 0.0–0.5)
Eosinophils Relative: 0 %
HCT: 42.8 % (ref 39.0–52.0)
Hemoglobin: 13.6 g/dL (ref 13.0–17.0)
Immature Granulocytes: 1 %
Lymphocytes Relative: 5 %
Lymphs Abs: 1 10*3/uL (ref 0.7–4.0)
MCH: 34.2 pg — ABNORMAL HIGH (ref 26.0–34.0)
MCHC: 31.8 g/dL (ref 30.0–36.0)
MCV: 107.5 fL — ABNORMAL HIGH (ref 80.0–100.0)
Monocytes Absolute: 0.7 10*3/uL (ref 0.1–1.0)
Monocytes Relative: 4 %
Neutro Abs: 17.2 10*3/uL — ABNORMAL HIGH (ref 1.7–7.7)
Neutrophils Relative %: 90 %
Platelets: 64 10*3/uL — ABNORMAL LOW (ref 150–400)
RBC: 3.98 MIL/uL — ABNORMAL LOW (ref 4.22–5.81)
RDW: 14 % (ref 11.5–15.5)
WBC: 19 10*3/uL — ABNORMAL HIGH (ref 4.0–10.5)
nRBC: 0 % (ref 0.0–0.2)

## 2022-07-15 LAB — URINE CULTURE: Culture: >=100000 — AB

## 2022-07-15 LAB — C DIFFICILE QUICK SCREEN W PCR REFLEX
C Diff antigen: NEGATIVE
C Diff interpretation: NOT DETECTED
C Diff toxin: NEGATIVE

## 2022-07-15 LAB — GLUCOSE, CAPILLARY
Glucose-Capillary: 108 mg/dL — ABNORMAL HIGH (ref 70–99)
Glucose-Capillary: 153 mg/dL — ABNORMAL HIGH (ref 70–99)

## 2022-07-15 LAB — MAGNESIUM: Magnesium: 2.4 mg/dL (ref 1.7–2.4)

## 2022-07-15 MED ORDER — SODIUM CHLORIDE 0.9 % IV SOLN
2.0000 g | INTRAVENOUS | Status: DC
  Administered 2022-07-15 – 2022-07-21 (×7): 2 g via INTRAVENOUS
  Filled 2022-07-15 (×7): qty 20

## 2022-07-15 MED ORDER — TECHNETIUM TO 99M ALBUMIN AGGREGATED
4.0000 | Freq: Once | INTRAVENOUS | Status: AC | PRN
  Administered 2022-07-15: 4.4 via INTRAVENOUS

## 2022-07-15 MED ORDER — HEPARIN (PORCINE) 25000 UT/250ML-% IV SOLN
1100.0000 [IU]/h | INTRAVENOUS | Status: DC
  Administered 2022-07-15 – 2022-07-18 (×4): 1100 [IU]/h via INTRAVENOUS
  Filled 2022-07-15 (×4): qty 250

## 2022-07-15 NOTE — Progress Notes (Signed)
ANTICOAGULATION CONSULT NOTE - Initial Consult  Pharmacy Consult for heparin gtt  Indication: pulmonary embolus  No Known Allergies  Patient Measurements: Height: 5\' 9"  (175.3 cm) Weight: 70.1 kg (154 lb 8.7 oz) IBW/kg (Calculated) : 70.7 Heparin Dosing Weight: HEPARIN DW (KG): 65.5   Vital Signs: Temp: 98.8 F (37.1 C) (08/12 1558) Temp Source: Bladder (08/12 1558) BP: 100/43 (08/12 1500) Pulse Rate: 77 (08/12 1558)  Labs: Recent Labs    07/13/22 1639 07/14/22 0312 07/15/22 0340  HGB 13.8 14.4 13.6  HCT 45.1 47.6 42.8  PLT 101* 93* 64*  APTT 27  --   --   LABPROT 17.2*  --   --   INR 1.4*  --   --   CREATININE 6.07* 5.69* 5.49*    Estimated Creatinine Clearance: 8.2 mL/min (A) (by C-G formula based on SCr of 5.49 mg/dL (H)).   Medical History: Past Medical History:  Diagnosis Date   Acute renal failure (HCC)    04/2015   Gout    Hypertension     Medications:  Medications Prior to Admission  Medication Sig Dispense Refill Last Dose   acetaminophen (TYLENOL) 325 MG tablet Take 2 tablets (650 mg total) by mouth every 6 (six) hours as needed for mild pain (or Fever >/= 101). 100 tablet 1    allopurinol (ZYLOPRIM) 100 MG tablet Take 200 mg by mouth daily.      amLODipine (NORVASC) 5 MG tablet Take 5 mg by mouth daily.      aspirin EC 81 MG tablet Take 81 mg by mouth daily.      doxazosin (CARDURA) 8 MG tablet Take 4 mg by mouth daily.       vitamin C (ASCORBIC ACID) 500 MG tablet Take 500 mg by mouth daily.      Scheduled:   Chlorhexidine Gluconate Cloth  6 each Topical Q0600   Infusions:   sodium chloride 125 mL/hr at 07/15/22 1504   sodium chloride Stopped (07/14/22 0730)   sodium chloride Stopped (07/14/22 0102)   cefTRIAXone (ROCEPHIN)  IV Stopped (07/15/22 1100)   heparin     norepinephrine (LEVOPHED) Adult infusion 2 mcg/min (07/15/22 1504)   PRN: acetaminophen **OR** acetaminophen, ondansetron **OR** ondansetron (ZOFRAN) IV, polyethylene  glycol Anti-infectives (From admission, onward)    Start     Dose/Rate Route Frequency Ordered Stop   07/15/22 1600  vancomycin (VANCOCIN) IVPB 1000 mg/200 mL premix  Status:  Discontinued        1,000 mg 200 mL/hr over 60 Minutes Intravenous  Once 07/14/22 1016 07/15/22 0840   07/15/22 0930  cefTRIAXone (ROCEPHIN) 2 g in sodium chloride 0.9 % 100 mL IVPB        2 g 200 mL/hr over 30 Minutes Intravenous Every 24 hours 07/15/22 0840     07/14/22 1500  ceFEPIme (MAXIPIME) 1 g in sodium chloride 0.9 % 100 mL IVPB  Status:  Discontinued        1 g 200 mL/hr over 30 Minutes Intravenous Every 24 hours 07/13/22 1542 07/15/22 0840   07/14/22 0600  metroNIDAZOLE (FLAGYL) IVPB 500 mg  Status:  Discontinued        500 mg 100 mL/hr over 60 Minutes Intravenous Every 12 hours 07/14/22 0032 07/15/22 0840   07/14/22 0200  metroNIDAZOLE (FLAGYL) IVPB 500 mg  Status:  Discontinued        500 mg 100 mL/hr over 60 Minutes Intravenous Every 12 hours 07/14/22 0026 07/14/22 0031   07/13/22 1552  vancomycin  variable dose per unstable renal function (pharmacist dosing)  Status:  Discontinued         Does not apply See admin instructions 07/13/22 1552 07/15/22 0840   07/13/22 1445  ceFEPIme (MAXIPIME) 2 g in sodium chloride 0.9 % 100 mL IVPB        2 g 200 mL/hr over 30 Minutes Intravenous  Once 07/13/22 1443 07/13/22 1954   07/13/22 1445  metroNIDAZOLE (FLAGYL) IVPB 500 mg        500 mg 100 mL/hr over 60 Minutes Intravenous  Once 07/13/22 1443 07/13/22 1954   07/13/22 1445  vancomycin (VANCOCIN) IVPB 1000 mg/200 mL premix        1,000 mg 200 mL/hr over 60 Minutes Intravenous  Once 07/13/22 1443 07/13/22 2254       Assessment: Robert Cardenas a 88 y.o. male requires anticoagulation with a heparin iv infusion for the indication of  pulmonary embolus. Heparin gtt will be started following pharmacy protocol per pharmacy consult. Patient is not on previous oral anticoagulant that will require aPTT/HL  correlation before transitioning to only HL monitoring.   Goal of Therapy:  Heparin level 0.3-0.7 units/ml Monitor platelets by anticoagulation protocol: Yes   Plan: no bolus due to 5000 units heparin given subq at 1411  Start heparin infusion at 1100 units/hr Check anti-Xa level in 8 hours and daily while on heparin Continue to monitor H&H and platelets  Robert Cardenas Robert Cardenas 07/15/2022,4:13 PM

## 2022-07-15 NOTE — Progress Notes (Signed)
Patient has been much more lethargic today not combative and agitated like yesterday, took off mittens.

## 2022-07-15 NOTE — Progress Notes (Signed)
Progress Note   Patient: Robert Cardenas WHQ:[id number] DOB: Oct 30, 1926 DOA: 07/13/2022     2 DOS: the patient was seen and examined on 07/15/2022   Brief hospital course: 88 year old male with a history of CKD, hypertension, prostate cancer status post brachytherapy, gouty arthritis presented when he the patient was found unresponsive at home with agonal respirations.  The patient remained somnolent and obtunded in the emergency department and was unable to provide any significant history.  History is obtained from review of the medical record and speaking with the patient's son.  According to the patient's son, the patient was last seen well approximately 2 days prior to this admission.  Apparently, there has been concern about the patient having cognitive impairment however the patient has been stubborn in terms of receiving medical care.  The patient lives by himself and has poor oral intake.  Apparently, the patient lives on a "tea and toast diet".  On initial arrival in the emergency department, nursing staff was concerned that they were unable to palpate a pulse and initiated CPR with chest compressions for less than 1 minute.  Subsequently, the patient was felt to have a faint femoral pulse with a blood pressure of 56/34.  Vasopressors were initially started.  The patient was given 2 L fluid boluses after which he was able to be weaned off vasopressors initially. The patient had low-grade temperature 100.4 F with tachypnea and hypotension initially.  WBC 20.5, hemoglobin 13.8, platelets 101,000.  Sodium 163, potassium 5.3, bicarbonate 16, BUN 144, serum creatinine 6.07.  Lactic acid peaked at 7.9>> 2.7.  CT brain was negative.  Chest x-ray showed bibasilar atelectasis, right greater than left.  ABG initially showed 7.3 7/30/175/17 on 2 L.  Troponins were 206>> 231.  EKG showed sinus rhythm and nonspecific T wave changes in the lateral leads.  UA showed 21-50 WBC,>50RBC.  The patient was started on  vancomycin, cefepime, metronidazole.  Assessment and Plan:  Septic shock ([place]) Patient found unresponsive, meeting sepsis criteria with hypothermia- temperature down to 94.3,  leukocytosis of 20, tachycardia heart rate ranging from 25-134 (bradycardia may be secondary to hypothermia), with tachypnea heart rate 21-31.  Hypotensive down to 56/34 improved initially with pressors and now with fluids.  Evidence of endorgan dysfunctionfailure and encephalopathy.   -Source of sepsis likely UTI  -Broad-spectrum antibiotics IV vancomycin cefepime  -Family has elected for DNR/DNI, no central lines, but otherwise with full scope of treatment -2.25 L bolus given, continue 1/2 N/s 125cc/hr x 1 day -ABG 7.37/30/175/17 on 2L Check PCT    Acute metabolic encephalopathy Likely secondary to septic shock, hypovolemia and azotemia with BUN of 144.  Head CT without acute abnormality, shows chronic microvascular ischemic changes and cerebral volume loss.  Son reports likely undiagnosed dementia. -EEG 8/12=> nonspecific, suggestive of moderate diffuse encephalopathy. No seizure activity -check ammonia in am -DC vanco iv, DC Cefepime iv  -Start Rocephin iv, Cont IVF - Remain n.p.o. - CBG Q8h   Elevated troponin Troponin elevated at 206.  Likely demand ischemia from severe sepsis and hypovolemia   EKG with T wave changes in lead I, aVL, V5, V6.   Ddx ARF on CKD stage3b - Echo (8/11) EF 60-65%   Hypernatremia Improving, cont 1/2 ns iv Check cmp in am   Goals of care, counseling/discussion Son confirms DNR/DNI   Pyuria UA with 21-50WBC Urine culture 8/10-> 100,000 Lactobacillus Blood culture 8/10-> + Enterobacter, Proteus MRSA screen negative DC Vanco iv, Cefepime-> Rocephin  Lactic acidosis Due to sepsis and hypoxia Continue IVF   Acute renal failure superimposed on stage 3b chronic kidney disease (HCC) Baseline creatinine 1.4-1.6 Presented with serum creatinine 6.07 Due to hemodynamic  changes/ATN, volume depletion   Acute respiratory failure with hypoxia (HCC) O2 sats down to 72% on room air and tachypnea -likely a degree of hypoventilation and possible aspiration pneumonitis -Obtain ABG 7.37/30/175/17 on 2L -stable on 2L -DNR, DNI -Right lower extremity venous Dopplers (8/11)-> no acute DVT, nonocclusive eccentric wall thickening most consistent with chronic DVT in the RLE involving the femoral vein, popliteal vein and proximal peroneal vein -VQ scan 8/12 => intermediate probability Start Heparin iv pharmacy to dose  I discussed with son that there is increased risk of bleeding, he wishes to proceed for now  Hypernatremia Improving with 1/2 ns iv Check cmp in am  Hyperkalemia Resolved Remain on tele   HTN (hypertension) History of hypertension.  Blood pressure initially 56/34, initially required pressors  -Continue Levophed for MAP less than 65 -IVF Fluids -Hold Norvasc, doxazosin   Vitamin B12 Deficiency Start vitamin b12 103mcrograms IM qday   Thrombocytopenia Check cbc in am  Abnormal liver function Check cmp in am Consider RUQ ultrasound if worsening       Family Communication: son updated 8/12   Consultants:  none   Code Status:  DNR   DVT Prophylaxis:  Heparin     Procedures: As Listed in Progress Note Above   Antibiotics: Vanc 8/10>>8/12 Cefepime 8/10>>8/12 Ceftriaxone 8/12->     The patient is critically ill with multiple organ systems failure and requires high complexity decision making for assessment and support, frequent evaluation and titration of therapies, application of advanced monitoring technologies and extensive interpretation of multiple databases.  Critical care time - 55 mins.       Subjective: pt unable to give history,  opens eyes, and tries to talk this am but I am unable to understand his words.  Slight diarrhea overnite, C. Diff ordered.   Physical Exam: Vitals:   07/15/22 0430 07/15/22 0500 07/15/22  0530 07/15/22 0600  BP: (!) 109/46 (!) 127/98 (!) 144/129 (!) 103/44  Pulse: 60 (!) 54 70 76  Resp: 20 (!) 24 20 (!) 27  Temp:      TempSrc:      SpO2: 92% 94% 93% 96%  Weight:    70.1 kg  Height:       Heent:anicteric, pupils 1.534msymmetric, direct, consensual, near intact, mucous membranes dry Neck: no jvd Heart: rrr s1, s2, no m/g/r Lung: CTAB Abd: soft, nt, nd, +bs Ext: no c/c/e   Data Reviewed: Wbc 19.0 (prev 33.4) Hgb 13.6, Plt 64 (prev 93) Na 156 (prev 162) Glucose 172 Bun 142, Creatinine 5.49 (6.9-> 6.07-> 5.69) Ast 68, Alt 25, Alk phos 55, T. Bili 1.3 Hco3 14 AG 13  Author: JaJani GravelMD 07/15/2022 6:56 AM  For on call review [email or web address]

## 2022-07-15 NOTE — Procedures (Signed)
Patient Name: Robert Cardenas  MRN: [id number]  Epilepsy Attending: Charlsie Quest  Referring Physician/Provider: Catarina Hartshorn, MD Date: 07/14/2022 Duration: 23.12 mins  Patient history: 88yo M with ams. EEG to evaluate for seizure.  Level of alertness: Awake  AEDs during EEG study: None  Technical aspects: This EEG study was done with scalp electrodes positioned according to the 10-20 International system of electrode placement. Electrical activity was reviewed with band pass filter of 1-70Hz , sensitivity of 7 uV/mm, display speed of 1mm/sec with a 60Hz  notched filter applied as appropriate. EEG data were recorded continuously and digitally stored.  Video monitoring was available and reviewed as appropriate.  Description: No posterior dominant rhythm was seen. EEG showed continuous generalized 3 to 6 Hz theta-delta slowing. Hyperventilation and photic stimulation were not performed.     EEG was technically difficult due to significant myogenic artifact.  ABNORMALITY - Continuous slow, generalized  IMPRESSION: This technically difficult study is suggestive of moderate diffuse encephalopathy, nonspecific etiology. No seizures or epileptiform discharges were seen throughout the recording.  Shantell Belongia 

## 2022-07-15 NOTE — Progress Notes (Signed)
Patient's bladder temp reading via temp foley is: 34.6 Celsius. Despite wrapping patient in warm blankets, and warming patient's room, patient's temperature remains low. New order to place a warming blanket under patient to increase patient's body temperature.

## 2022-07-15 NOTE — Plan of Care (Signed)

## 2022-07-16 ENCOUNTER — Inpatient Hospital Stay: Payer: Self-pay

## 2022-07-16 DIAGNOSIS — A419 Sepsis, unspecified organism: Secondary | ICD-10-CM | POA: Diagnosis not present

## 2022-07-16 DIAGNOSIS — N179 Acute kidney failure, unspecified: Secondary | ICD-10-CM | POA: Diagnosis not present

## 2022-07-16 DIAGNOSIS — J9601 Acute respiratory failure with hypoxia: Secondary | ICD-10-CM | POA: Diagnosis not present

## 2022-07-16 DIAGNOSIS — G9341 Metabolic encephalopathy: Secondary | ICD-10-CM | POA: Diagnosis not present

## 2022-07-16 LAB — COMPREHENSIVE METABOLIC PANEL
ALT: 31 U/L (ref 0–44)
AST: 71 U/L — ABNORMAL HIGH (ref 15–41)
Albumin: 2.1 g/dL — ABNORMAL LOW (ref 3.5–5.0)
Alkaline Phosphatase: 59 U/L (ref 38–126)
Anion gap: 13 (ref 5–15)
BUN: 138 mg/dL — ABNORMAL HIGH (ref 8–23)
CO2: 13 mmol/L — ABNORMAL LOW (ref 22–32)
Calcium: 7.4 mg/dL — ABNORMAL LOW (ref 8.9–10.3)
Chloride: 126 mmol/L — ABNORMAL HIGH (ref 98–111)
Creatinine, Ser: 4.98 mg/dL — ABNORMAL HIGH (ref 0.61–1.24)
GFR, Estimated: 10 mL/min — ABNORMAL LOW (ref >60)
Glucose, Bld: 109 mg/dL — ABNORMAL HIGH (ref 70–99)
Potassium: 3.7 mmol/L (ref 3.5–5.1)
Sodium: 152 mmol/L — ABNORMAL HIGH (ref 135–145)
Total Bilirubin: 0.9 mg/dL (ref 0.3–1.2)
Total Protein: 5.1 g/dL — ABNORMAL LOW (ref 6.5–8.1)

## 2022-07-16 LAB — CBC WITH DIFFERENTIAL/PLATELET
Abs Immature Granulocytes: 0 10*3/uL (ref 0.00–0.07)
Basophils Absolute: 0 10*3/uL (ref 0.0–0.1)
Basophils Relative: 0 %
Eosinophils Absolute: 0 10*3/uL (ref 0.0–0.5)
Eosinophils Relative: 0 %
HCT: 38.4 % — ABNORMAL LOW (ref 39.0–52.0)
Hemoglobin: 12.3 g/dL — ABNORMAL LOW (ref 13.0–17.0)
Lymphocytes Relative: 7 %
Lymphs Abs: 1.1 10*3/uL (ref 0.7–4.0)
MCH: 33.6 pg (ref 26.0–34.0)
MCHC: 32 g/dL (ref 30.0–36.0)
MCV: 104.9 fL — ABNORMAL HIGH (ref 80.0–100.0)
Monocytes Absolute: 0 10*3/uL — ABNORMAL LOW (ref 0.1–1.0)
Monocytes Relative: 0 %
Neutro Abs: 15.1 10*3/uL — ABNORMAL HIGH (ref 1.7–7.7)
Neutrophils Relative %: 93 %
Platelets: 69 10*3/uL — ABNORMAL LOW (ref 150–400)
RBC: 3.66 MIL/uL — ABNORMAL LOW (ref 4.22–5.81)
RDW: 14.1 % (ref 11.5–15.5)
WBC: 16.2 10*3/uL — ABNORMAL HIGH (ref 4.0–10.5)
nRBC: 0 % (ref 0.0–0.2)

## 2022-07-16 LAB — HEPARIN LEVEL (UNFRACTIONATED)
Heparin Unfractionated: 0.66 IU/mL (ref 0.30–0.70)
Heparin Unfractionated: 0.68 IU/mL (ref 0.30–0.70)

## 2022-07-16 LAB — CK TOTAL AND CKMB (NOT AT ARMC)
CK, MB: 32.6 ng/mL — ABNORMAL HIGH (ref 0.5–5.0)
Relative Index: 3.4 — ABNORMAL HIGH (ref 0.0–2.5)
Total CK: 949 U/L — ABNORMAL HIGH (ref 49–397)

## 2022-07-16 LAB — GLUCOSE, CAPILLARY: Glucose-Capillary: 104 mg/dL — ABNORMAL HIGH (ref 70–99)

## 2022-07-16 LAB — CULTURE, BLOOD (ROUTINE X 2): Special Requests: ADEQUATE

## 2022-07-16 LAB — AMMONIA: Ammonia: 21 umol/L (ref 9–35)

## 2022-07-16 MED ORDER — CYANOCOBALAMIN 1000 MCG/ML IJ SOLN
1000.0000 ug | Freq: Once | INTRAMUSCULAR | Status: AC
  Administered 2022-07-16: 1000 ug via INTRAMUSCULAR
  Filled 2022-07-16: qty 1

## 2022-07-16 MED ORDER — COLLAGENASE 250 UNIT/GM EX OINT
TOPICAL_OINTMENT | Freq: Every day | CUTANEOUS | Status: DC
  Filled 2022-07-16 (×2): qty 30

## 2022-07-16 MED ORDER — DEXTROSE-NACL 5-0.45 % IV SOLN: INTRAVENOUS | Status: DC

## 2022-07-16 NOTE — Progress Notes (Signed)
ANTICOAGULATION CONSULT NOTE  Pharmacy Consult for heparin  Indication: pulmonary embolus Brief A/P: Heparin level within goal range Continue Heparin at current rate   No Known Allergies  Patient Measurements: Height: 5\' 9"  (175.3 cm) Weight: 70.1 kg (154 lb 8.7 oz) IBW/kg (Calculated) : 70.7 Heparin Dosing Weight: HEPARIN DW (KG): 65.5   Vital Signs: Temp: 99.1 F (37.3 C) (08/13 0200) Temp Source: Bladder (08/13 0000) BP: 122/59 (08/13 0200) Pulse Rate: 70 (08/13 0200)  Labs: Recent Labs    07/13/22 1639 07/14/22 0312 07/15/22 0340 07/16/22 0035  HGB 13.8 14.4 13.6  --   HCT 45.1 47.6 42.8  --   PLT 101* 93* 64*  --   APTT 27  --   --   --   LABPROT 17.2*  --   --   --   INR 1.4*  --   --   --   HEPARINUNFRC  --   --   --  0.68  CREATININE 6.07* 5.69* 5.49*  --      Estimated Creatinine Clearance: 8.2 mL/min (A) (by C-G formula based on SCr of 5.49 mg/dL (H)).  Assessment: 88 y.o. male with PE for heparin  Goal of Therapy:  Heparin level 0.3-0.7 units/ml Monitor platelets by anticoagulation protocol: Yes   Plan:  Continue Heparin at current rate  Follow-up am labs.  97 07/16/2022,2:49 AM

## 2022-07-16 NOTE — Progress Notes (Signed)
Progress Note   Patient: Robert Cardenas PPJ:[id number] DOB: 12-01-1926 DOA: 07/13/2022     3 DOS: the patient was seen and examined on 07/16/2022   Brief hospital course: 88 year old male with a history of CKD, hypertension, prostate cancer status post brachytherapy, gouty arthritis presented when he the patient was found unresponsive at home with agonal respirations.  The patient remained somnolent and obtunded in the emergency department and was unable to provide any significant history.  History is obtained from review of the medical record and speaking with the patient's son.  According to the patient's son, the patient was last seen well approximately 2 days prior to this admission.  Apparently, there has been concern about the patient having cognitive impairment however the patient has been stubborn in terms of receiving medical care.  The patient lives by himself and has poor oral intake.  Apparently, the patient lives on a "tea and toast diet".  On initial arrival in the emergency department, nursing staff was concerned that they were unable to palpate a pulse and initiated CPR with chest compressions for less than 1 minute.  Subsequently, the patient was felt to have a faint femoral pulse with a blood pressure of 56/34.  Vasopressors were initially started.  The patient was given 2 L fluid boluses after which he was able to be weaned off vasopressors initially. The patient had low-grade temperature 100.4 F with tachypnea and hypotension initially.  WBC 20.5, hemoglobin 13.8, platelets 101,000.  Sodium 163, potassium 5.3, bicarbonate 16, BUN 144, serum creatinine 6.07.  Lactic acid peaked at 7.9>> 2.7.  CT brain was negative.  Chest x-ray showed bibasilar atelectasis, right greater than left.  ABG initially showed 7.3 7/30/175/17 on 2 L.  Troponins were 206>> 231.  EKG showed sinus rhythm and nonspecific T wave changes in the lateral leads.  UA showed 21-50 WBC,>50RBC.  The patient was started on  vancomycin, cefepime, metronidazole.  Assessment and Plan:   Septic shock (HCC) Patient found unresponsive, meeting sepsis criteria with hypothermia- temperature down to 94.3,  leukocytosis of 20, tachycardia heart rate ranging from 25-134 (bradycardia may be secondary to hypothermia), with tachypnea heart rate 21-31.  Hypotensive down to 56/34 improved initially with pressors and now with fluids.  Evidence of endorgan dysfunctionfailure and encephalopathy.   -Source of sepsis likely UTI  -Broad-spectrum antibiotics IV vancomycin cefepime initiated -Family has elected for DNR/DNI, no central lines, but otherwise with full scope of treatment -2.25 L bolus given, continue 1/2 N/s 125cc/hr x 1 day -ABG 7.37/30/175/17 on 2L -Blood culture 8/10-> +Enterobacterales, Proteus -Urine culture 8/10->  + Lactobacillus -C. Diff 8/12-> negative Vanco 8/10->8/12 Cefepime 8/10->8/12 Ceftriaxone 2gm iv qday 8/12->>>     Acute metabolic encephalopathy Likely secondary to septic shock, hypovolemia and azotemia with BUN of 144.  Head CT without acute abnormality, shows chronic microvascular ischemic changes and cerebral volume loss.  Son reports likely undiagnosed dementia. -EEG 8/12=> nonspecific, suggestive of moderate diffuse encephalopathy. No seizure activity -Ammonia pending -Cont iv abx, see above -Remain n.p.o. -CBG Q8h - pt was able to ask for water this am, appears to be improving   Elevated troponin Troponin elevated at 206.  Likely demand ischemia from severe sepsis and hypovolemia   EKG with T wave changes in lead I, aVL, V5, V6.   Ddx ARF on CKD stage3b - Echo (8/11) EF 60-65%   Hypernatremia Improving, 1/2 ns iv -> D51/2 ns iv Check cmp in am   Goals of care, counseling/discussion Son  confirms DNR/DNI    Pyuria UA with 21-50WBC Urine culture 8/10-> 100,000 Lactobacillus Blood culture 8/10-> + Enterobacter, Proteus MRSA screen negative Cont Abx (Rocephine 2gm iv qday 8/12=>>),  see above   Lactic acidosis Due to sepsis and hypoxia Continue IVF   Acute renal failure superimposed on stage 3b chronic kidney disease (HCC) Baseline creatinine 1.4-1.6 Presented with serum creatinine 6.07 Due to hemodynamic changes/ATN, volume depletion   Acute respiratory failure with hypoxia (HCC) O2 sats down to 72% on room air and tachypnea -likely a degree of hypoventilation and possible aspiration pneumonitis -Obtain ABG 7.37/30/175/17 on 2L -stable on 2L -DNR, DNI -Right lower extremity venous Dopplers (8/11)-> no acute DVT, nonocclusive eccentric wall thickening most consistent with chronic DVT in the RLE involving the femoral vein, popliteal vein and proximal peroneal vein -VQ scan 8/12 => intermediate probability Start Heparin iv pharmacy to dose 8/12 I discussed with son 8/12 that there is increased risk of bleeding, he wishes to proceed for now   Hypernatremia Improving w IVF Check cmp in am   Hyperkalemia Resolved Remain on tele   HTN (hypertension) History of hypertension.  Blood pressure initially 56/34, initially required pressors  -Continue Levophed for MAP less than 65 -Cont IVF Fluids -Holding Norvasc, Doxazosin   Vitamin B12 Deficiency Start vitamin b12 IM qday   Thrombocytopenia  Check cbc in am   Abnormal liver function Check cmp in am Consider RUQ ultrasound if worsening       Family Communication: son updated 8/12, 8/13   Consultants:  none   Code Status:  DNR   DVT Prophylaxis:  Heparin     Procedures: As Listed in Progress Note Above   Antibiotics: Vanc 8/10>>8/12 Cefepime 8/10>>8/12 Ceftriaxone 8/12->     The patient is critically ill with multiple organ systems failure and requires high complexity decision making for assessment and support, frequent evaluation and titration of therapies, application of advanced monitoring technologies and extensive interpretation of multiple databases.  Critical care time  - 55 mins.   Subjective: pt appears slightly more alert this am.  Pt able to ask for water.  Unclear how his swallowing is.   Physical Exam: Vitals:   07/16/22 0500 07/16/22 0530 07/16/22 0600 07/16/22 0630  BP: (!) 130/42 136/65 (!) 123/52 (!) 110/58  Pulse: (!) 59 61 81 63  Resp: (!) 25 (!) 25 (!) 21 (!) 21  Temp: 98.4 F (36.9 C) 98.2 F (36.8 C) 98.2 F (36.8 C) 98.4 F (36.9 C)  TempSrc:      SpO2: 93% 95% 94% 92%  Weight:      Height:       Heent:anicteric, pupils 1.57mm symmetric, direct, consensual, near intact, mucous membranes dry Neck: no jvd Heart: rrr s1, s2, no m/g/r Lung: CTAB Abd: soft, nt, nd, +bs Ext: no c/c/e   Data Reviewed: Na 152, K 3.7 Hco3 13, Cl 126 Bun 138, Creatinien 4.98 Alb 2.1 Ast 71, Alt 31  Author: Pearson Grippe, MD 07/16/2022 7:00 AM  For on call review [email or web address].

## 2022-07-16 NOTE — Consult Note (Signed)
WOC Nurse Consult Note: Reason for Consult:Deep tissue pressure injury to buttocks, R>L. See photo taken by EDP on admission. Patient found down at home and soiled. Poor nutrition.  CPR in the ED. Wound type:Pressure Pressure Injury POA: Yes Measurement:14.5cm x 10.5cm with nonviable tissue obscuring wound base/bed. Wound bed: Drainage (amount, consistency, odor) Small serosanguinous Periwound: Intact with periwound erythema Dressing procedure/placement/frequency: Patient is on a mattress replacement with low air loss feature as he is in the ICU. When he transfers out of the ICU, I have ordered that a mattress replacement be ordered as he will require pressure redistribution for tissue repair and regeneration.Topical care will be with an enzymatic debriding agent (collagenase/Santyl) applied once daily and PRN soiling. Turning and repositioning from side to side and minimizing time in the supine position is ordered. Bilateral pressure redistribution heel boots are provided. These wounds are likely to result in full thickness skin loss, specifically, Stage 3 or Stage 4 pressure injuries, despite all appropriate measures being in place.   Recommend surgical consult to determine if surgical debridement is indicated in addition to the enzymatic debriding agent I initiate today. A nutritional (RD) consult is recommended. If you agree, please order/arrange.  WOC nursing team will not follow, but will remain available to this patient, the nursing and medical teams.  Please re-consult if needed.  Thank you for inviting [place] to participate in this patient's Plan of Care.  Ladona Mow, MSN, RN, CNS, GNP, Leda Min, [employer], [employer] phone:  [phone]

## 2022-07-16 NOTE — Progress Notes (Signed)
ANTICOAGULATION CONSULT NOTE  Pharmacy Consult for heparin  Indication: pulmonary embolus Brief A/P: Heparin level within goal range Continue Heparin at current rate   No Known Allergies  Patient Measurements: Height: 5\' 9"  (175.3 cm) Weight: 70.1 kg (154 lb 8.7 oz) IBW/kg (Calculated) : 70.7 Heparin Dosing Weight: HEPARIN DW (KG): 65.5   Vital Signs: Temp: 98.6 F (37 C) (08/13 0734) Temp Source: Bladder (08/13 0734) BP: 105/46 (08/13 0730) Pulse Rate: 68 (08/13 0734)  Labs: Recent Labs    07/13/22 1639 07/14/22 0312 07/15/22 0340 07/16/22 0035 07/16/22 0325  HGB 13.8 14.4 13.6  --  12.3*  HCT 45.1 47.6 42.8  --  38.4*  PLT 101* 93* 64*  --  69*  APTT 27  --   --   --   --   LABPROT 17.2*  --   --   --   --   INR 1.4*  --   --   --   --   HEPARINUNFRC  --   --   --  0.68 0.66  CREATININE 6.07* 5.69* 5.49*  --   --      Estimated Creatinine Clearance: 8.2 mL/min (A) (by C-G formula based on SCr of 5.49 mg/dL (H)).  Assessment: 88 y.o. male with PE for heparin  Goal of Therapy:  Heparin level 0.3-0.7 units/ml Monitor platelets by anticoagulation protocol: Yes   Plan:  Continue Heparin at current rate  Follow-up am labs.  97 Laela Deviney 07/16/2022,7:50 AM

## 2022-07-17 ENCOUNTER — Encounter (HOSPITAL_COMMUNITY): Payer: Self-pay | Admitting: Internal Medicine

## 2022-07-17 DIAGNOSIS — A419 Sepsis, unspecified organism: Secondary | ICD-10-CM | POA: Diagnosis not present

## 2022-07-17 DIAGNOSIS — Z7189 Other specified counseling: Secondary | ICD-10-CM | POA: Diagnosis not present

## 2022-07-17 DIAGNOSIS — Z515 Encounter for palliative care: Secondary | ICD-10-CM | POA: Diagnosis not present

## 2022-07-17 DIAGNOSIS — R6521 Severe sepsis with septic shock: Secondary | ICD-10-CM | POA: Diagnosis not present

## 2022-07-17 LAB — CBC
HCT: 34.7 % — ABNORMAL LOW (ref 39.0–52.0)
Hemoglobin: 11.3 g/dL — ABNORMAL LOW (ref 13.0–17.0)
MCH: 33.9 pg (ref 26.0–34.0)
MCHC: 32.6 g/dL (ref 30.0–36.0)
MCV: 104.2 fL — ABNORMAL HIGH (ref 80.0–100.0)
Platelets: 60 10*3/uL — ABNORMAL LOW (ref 150–400)
RBC: 3.33 MIL/uL — ABNORMAL LOW (ref 4.22–5.81)
RDW: 14.6 % (ref 11.5–15.5)
WBC: 11.9 10*3/uL — ABNORMAL HIGH (ref 4.0–10.5)
nRBC: 0 % (ref 0.0–0.2)

## 2022-07-17 LAB — COMPREHENSIVE METABOLIC PANEL
ALT: 25 U/L (ref 0–44)
AST: 41 U/L (ref 15–41)
Albumin: 1.8 g/dL — ABNORMAL LOW (ref 3.5–5.0)
Alkaline Phosphatase: 50 U/L (ref 38–126)
Anion gap: 7 (ref 5–15)
BUN: 116 mg/dL — ABNORMAL HIGH (ref 8–23)
CO2: 15 mmol/L — ABNORMAL LOW (ref 22–32)
Calcium: 7.3 mg/dL — ABNORMAL LOW (ref 8.9–10.3)
Chloride: 125 mmol/L — ABNORMAL HIGH (ref 98–111)
Creatinine, Ser: 4.16 mg/dL — ABNORMAL HIGH (ref 0.61–1.24)
GFR, Estimated: 13 mL/min — ABNORMAL LOW (ref >60)
Glucose, Bld: 166 mg/dL — ABNORMAL HIGH (ref 70–99)
Potassium: 3.3 mmol/L — ABNORMAL LOW (ref 3.5–5.1)
Sodium: 147 mmol/L — ABNORMAL HIGH (ref 135–145)
Total Bilirubin: 0.7 mg/dL (ref 0.3–1.2)
Total Protein: 4.5 g/dL — ABNORMAL LOW (ref 6.5–8.1)

## 2022-07-17 LAB — GLUCOSE, CAPILLARY
Glucose-Capillary: 167 mg/dL — ABNORMAL HIGH (ref 70–99)
Glucose-Capillary: 148 mg/dL — ABNORMAL HIGH (ref 70–99)
Glucose-Capillary: 183 mg/dL — ABNORMAL HIGH (ref 70–99)

## 2022-07-17 LAB — HEPARIN LEVEL (UNFRACTIONATED): Heparin Unfractionated: 0.6 IU/mL (ref 0.30–0.70)

## 2022-07-17 MED ORDER — POTASSIUM CHLORIDE 10 MEQ/100ML IV SOLN
10.0000 meq | INTRAVENOUS | Status: AC
  Administered 2022-07-17 (×4): 10 meq via INTRAVENOUS
  Filled 2022-07-17 (×2): qty 100

## 2022-07-17 MED ORDER — ALBUMIN HUMAN 25 % IV SOLN
25.0000 g | Freq: Four times a day (QID) | INTRAVENOUS | Status: AC
  Administered 2022-07-17 – 2022-07-18 (×3): 25 g via INTRAVENOUS
  Filled 2022-07-17 (×3): qty 100

## 2022-07-17 MED ORDER — MAGNESIUM SULFATE 2 GM/50ML IV SOLN
2.0000 g | Freq: Once | INTRAVENOUS | Status: AC
Start: 2022-07-17 — End: 2022-07-17
  Administered 2022-07-17: 2 g via INTRAVENOUS
  Filled 2022-07-17: qty 50

## 2022-07-17 MED ORDER — POTASSIUM CHLORIDE 10 MEQ/50ML IV SOLN: 10.0000 meq | INTRAVENOUS | Status: DC

## 2022-07-17 NOTE — Progress Notes (Signed)
ANTICOAGULATION CONSULT NOTE  Pharmacy Consult for heparin  Indication: pulmonary embolus  No Known Allergies  Patient Measurements: Height: 5\' 9"  (175.3 cm) Weight: 70.1 kg (154 lb 8.7 oz) IBW/kg (Calculated) : 70.7 HEPARIN DW (KG): 65.5  Vital Signs: Temp: 97.5 F (36.4 C) (08/14 0808) Temp Source: Bladder (08/14 0800) BP: 108/40 (08/14 0808) Pulse Rate: 64 (08/14 0808)  Labs: Recent Labs    07/15/22 0340 07/16/22 0035 07/16/22 0325 07/16/22 0356 07/17/22 0343  HGB 13.6  --  12.3*  --  11.3*  HCT 42.8  --  38.4*  --  34.7*  PLT 64*  --  69*  --  60*  HEPARINUNFRC  --  0.68 0.66  --  0.60  CREATININE 5.49*  --  4.98*  --  4.16*  CKTOTAL  --   --   --  949*  --   CKMB  --   --   --  32.6*  --      Estimated Creatinine Clearance: 10.8 mL/min (A) (by C-G formula based on SCr of 4.16 mg/dL (H)).  Assessment: 88 y.o. male with PE for heparin  HL 0.6, therapeutic. No issues with infusion  Goal of Therapy:  Heparin level 0.3-0.7 units/ml Monitor platelets by anticoagulation protocol: Yes   Plan:  Continue Heparin at 1100 units/hr Check anti-Xa level daily while on heparin Continue to monitor H&H and platelets F/U transition to po tx  97, BS Pharm D, BCPS Clinical Pharmacist 07/17/2022,8:32 AM

## 2022-07-17 NOTE — Plan of Care (Signed)
  Problem: Acute Rehab PT Goals(only PT should resolve) Goal: Pt Will Go Supine/Side To Sit Outcome: Progressing Flowsheets (Taken 07/17/2022 1550) Pt will go Supine/Side to Sit: with moderate assist Goal: Patient Will Transfer Sit To/From Stand Outcome: Progressing Flowsheets (Taken 07/17/2022 1550) Patient will transfer sit to/from stand: with moderate assist Goal: Pt Will Transfer Bed To Chair/Chair To Bed Outcome: Progressing Flowsheets (Taken 07/17/2022 1550) Pt will Transfer Bed to Chair/Chair to Bed:  with mod assist  with max assist Goal: Pt Will Ambulate Outcome: Progressing Flowsheets (Taken 07/17/2022 1550) Pt will Ambulate:  10 feet  with moderate assist   3:51 PM, 07/17/22 Robert Cardenas, MPT Physical Therapist with [facility] [phone] office [phone] mobile phone

## 2022-07-17 NOTE — Plan of Care (Signed)

## 2022-07-17 NOTE — Evaluation (Signed)
Clinical/Bedside Swallow Evaluation Patient Details  Name: Robert Cardenas MRN: [id number] Date of Birth: 03/26/1926  Today's Date: 07/17/2022 Time: SLP Start Time (ACUTE ONLY): 1636 SLP Stop Time (ACUTE ONLY): 1656 SLP Time Calculation (min) (ACUTE ONLY): 20 min  Past Medical History:  Past Medical History:  Diagnosis Date   Acute renal failure (HCC)    04/2015   Gout    Hypertension    Past Surgical History:  Past Surgical History:  Procedure Laterality Date   BALLOON DILATION N/A 05/12/2018   Procedure: BALLOON DILATION;  Surgeon: Malissa Hippo, MD;  Location: [facility];  Service: Endoscopy;  Laterality: N/A;  dilated ampula to 10   CHOLECYSTECTOMY N/A 05/13/2018   Procedure: LAPAROSCOPIC CHOLECYSTECTOMY;  Surgeon: Lucretia Roers, MD;  Location: [facility];  Service: General;  Laterality: N/A;   ERCP N/A 05/12/2018   Procedure: ENDOSCOPIC RETROGRADE CHOLANGIOPANCREATOGRAPHY (ERCP);  Surgeon: Malissa Hippo, MD;  Location: [facility];  Service: Endoscopy;  Laterality: N/A;   PROSTATE SURGERY     REMOVAL OF STONES N/A 05/12/2018   Procedure: REMOVAL OF STONES;  Surgeon: Malissa Hippo, MD;  Location: [facility];  Service: Endoscopy;  Laterality: N/A;   SPHINCTEROTOMY N/A 05/12/2018   Procedure: SPHINCTEROTOMY;  Surgeon: Malissa Hippo, MD;  Location: [facility];  Service: Endoscopy;  Laterality: N/A;   HPI:  Robert Cardenas is a 88 y.o. male with medical history significant for HTN, Gout, hard of hearing.    Patient was brought to the ED via EMS after patient was found at home by his son unresponsive and with agonal respiration.  At the time of my evaluation, patient appears conscious-with eyes open, intermittently moving his head from side-to-side, moans a little, but not following directions, a flicker of movement of upper extremities to pain.   Per chart review, ED provider talked to patient's son,-Kalim Johan, [place]., who is patient's only living direct relative, patient  was last seen well, and in his normal state of health about 2 days ago.  He lives by himself, on tea and toast diet.    I talked to patients son on the phone, according to son, patient has declined over the past several months, and was trying to establish home care for patient, and likely has undiagnosed oral intake.  Patient was found unresponsive by his son, EMS found patient covered in urine, and were initially unsure if patient had a pulse.    Assessment / Plan / Recommendation  Clinical Impression  Clinical swallowing evaluation completed while Pt was sitting upright in bed. Pt holds his chin up and required cues to put chin to normal resting position but continued to lift chin to head back position throughout trials/eval. Pt consumed Thin liquids/NTL and HTL with immediate overt s/sx of aspiration including immediate coughing and watering eyes, multiple swallows and residual wet coughing after swallowing. With puree textures note facial grimacing with multiple swallows and wet delayed coughing. Recommend continue NPO status and proceed with instrumental assessment tomorrow. Above to RN; Following oral care permit occasional single ice chips - as Pt is requesting water. Thank you, SLP Visit Diagnosis: Dysphagia, unspecified (R13.10)    Aspiration Risk  Severe aspiration risk;Risk for inadequate nutrition/hydration    Diet Recommendation NPO   Medication Administration: Via alternative means    Other  Recommendations Oral Care Recommendations: Oral care BID;Oral care prior to ice chip/H20    Recommendations for follow up therapy are one component of a multi-disciplinary  discharge planning process, led by the attending physician.  Recommendations may be updated based on patient status, additional functional criteria and insurance authorization.  Follow up Recommendations        Assistance Recommended at Discharge    Functional Status Assessment    Frequency and Duration min 2x/week  1  week       Prognosis Prognosis for Safe Diet Advancement: Fair Barriers to Reach Goals: Severity of deficits      Swallow Study   General Date of Onset: 07/17/22 HPI: Robert Cardenas is a 88 y.o. male with medical history significant for HTN, Gout, hard of hearing.    Patient was brought to the ED via EMS after patient was found at home by his son unresponsive and with agonal respiration.  At the time of my evaluation, patient appears conscious-with eyes open, intermittently moving his head from side-to-side, moans a little, but not following directions, a flicker of movement of upper extremities to pain.   Per chart review, ED provider talked to patient's son,-Haze Juniper, [place]., who is patient's only living direct relative, patient was last seen well, and in his normal state of health about 2 days ago.  He lives by himself, on tea and toast diet.    I talked to patients son on the phone, according to son, patient has declined over the past several months, and was trying to establish home care for patient, and likely has undiagnosed oral intake.  Patient was found unresponsive by his son, EMS found patient covered in urine, and were initially unsure if patient had a pulse. Type of Study: Bedside Swallow Evaluation Previous Swallow Assessment: none in chart Diet Prior to this Study: NPO Temperature Spikes Noted: No Respiratory Status: Room air History of Recent Intubation: No Behavior/Cognition: Alert;Cooperative;Pleasant mood;Confused Oral Cavity Assessment: Within Functional Limits Oral Care Completed by SLP: Yes Oral Cavity - Dentition: Edentulous;Missing dentition Vision: Functional for self-feeding Self-Feeding Abilities: Needs assist;Needs set up;Total assist Patient Positioning: Upright in bed Baseline Vocal Quality: Low vocal intensity Volitional Cough: Weak;Congested Volitional Swallow: Unable to elicit    Oral/Motor/Sensory Function Overall Oral Motor/Sensory Function: Within  functional limits   Ice Chips Ice chips: Impaired Oral Phase Impairments: Poor awareness of bolus;Reduced lingual movement/coordination Pharyngeal Phase Impairments: Multiple swallows;Wet Vocal Quality;Cough - Delayed   Thin Liquid Thin Liquid: Impaired Presentation: Cup;Spoon Oral Phase Impairments: Poor awareness of bolus;Reduced lingual movement/coordination Pharyngeal  Phase Impairments: Multiple swallows;Wet Vocal Quality;Throat Clearing - Delayed;Cough - Immediate;Cough - Delayed;Throat Clearing - Immediate;Suspected delayed Swallow    Nectar Thick Nectar Thick Liquid: Impaired Presentation: Cup;Spoon Oral Phase Impairments: Reduced lingual movement/coordination;Poor awareness of bolus Pharyngeal Phase Impairments: Multiple swallows;Wet Vocal Quality;Throat Clearing - Delayed;Cough - Immediate;Cough - Delayed;Throat Clearing - Immediate;Suspected delayed Swallow   Honey Thick Honey Thick Liquid: Impaired Presentation: Cup Oral Phase Impairments: Poor awareness of bolus;Reduced lingual movement/coordination Pharyngeal Phase Impairments: Multiple swallows;Wet Vocal Quality;Cough - Delayed;Throat Clearing - Immediate;Throat Clearing - Delayed;Suspected delayed Swallow   Puree Puree: Impaired Presentation: Spoon Oral Phase Impairments: Poor awareness of bolus Oral Phase Functional Implications: Oral holding Pharyngeal Phase Impairments: Throat Clearing - Delayed;Multiple swallows   Solid     Solid: Not tested     Skyelynn Rambeau H. Romie Levee, CCC-SLP Speech Language Pathologist  [name] 07/17/2022,4:56 PM

## 2022-07-17 NOTE — Evaluation (Signed)
Physical Therapy Evaluation Patient Details Name: Robert Cardenas MRN: [id number] DOB: [birth year] Today's Date: 07/17/2022  History of Present Illness  Robert Cardenas is a 88 y.o. male with medical history significant for HTN, Gout, hard of hearing.    Patient was brought to the ED via EMS after patient was found at home by his son unresponsive and with agonal respiration.  At the time of my evaluation, patient appears conscious-with eyes open, intermittently moving his head from side-to-side, moans a little, but not following directions, a flicker of movement of upper extremities to pain.   Per chart review, ED provider talked to patient's son,-Robert Duwayne, [place]., who is patient's only living direct relative, patient was last seen well, and in his normal state of health about 2 days ago.  He lives by himself, on tea and toast diet.    I talked to patients son on the phone, according to son, patient has declined over the past several months, and was trying to establish home care for patient, and likely has undiagnosed oral intake.  Patient was found unresponsive by his son, EMS found patient covered in urine, and were initially unsure if patient had a pulse.   Clinical Impression  Patient demonstrates slow labored movement for sitting up at bedside requiring HOB raised and Max/total assist with frequent falling backwards due to poor sitting balance/trunk control.  Patient put back to bed with Max assist to reposition.  Patient will benefit from continued skilled physical therapy in hospital and recommended venue below to increase strength, balance, endurance for safe ADLs and gait.         Recommendations for follow up therapy are one component of a multi-disciplinary discharge planning process, led by the attending physician.  Recommendations may be updated based on patient status, additional functional criteria and insurance authorization.  Follow Up Recommendations Skilled nursing-short term rehab  (<3 hours/day) Can patient physically be transported by private vehicle: No    Assistance Recommended at Discharge Intermittent Supervision/Assistance  Patient can return home with the following  A lot of help with bathing/dressing/bathroom;A lot of help with walking and/or transfers;Help with stairs or ramp for entrance;Assistance with cooking/housework    Equipment Recommendations None recommended by PT  Recommendations for Other Services       Functional Status Assessment Patient has had a recent decline in their functional status and demonstrates the ability to make significant improvements in function in a reasonable and predictable amount of time.     Precautions / Restrictions Precautions Precautions: Fall Restrictions Weight Bearing Restrictions: No      Mobility  Bed Mobility Overal bed mobility: Needs Assistance Bed Mobility: Supine to Sit, Sit to Supine     Supine to sit: Max assist, Total assist Sit to supine: Max assist, Total assist   General bed mobility comments: slow labored movement requiring HOB raised    Transfers                        Ambulation/Gait                  Stairs            Wheelchair Mobility    Modified Rankin (Stroke Patients Only)       Balance Overall balance assessment: Needs assistance Sitting-balance support: Feet supported, Bilateral upper extremity supported Sitting balance-Leahy Scale: Poor Sitting balance - Comments: seated at EOB Postural control: Posterior lean  Pertinent Vitals/Pain Pain Assessment Pain Assessment: Faces Faces Pain Scale: Hurts little more Pain Location: movement/pressure to BUE Pain Descriptors / Indicators: Sore, Grimacing Pain Intervention(s): Limited activity within patient's tolerance, Monitored during session, Repositioned    Home Living Family/patient expects to be discharged to:: Private residence Living  Arrangements: Alone                 Additional Comments: Patient is a poor historain and unable to provide information    Prior Function Prior Level of Function : Needs assist       Physical Assist : Mobility (physical);ADLs (physical) Mobility (physical): Bed mobility;Transfers;Gait;Stairs     ADLs Comments: assisted by family     Hand Dominance        Extremity/Trunk Assessment   Upper Extremity Assessment Upper Extremity Assessment: Generalized weakness    Lower Extremity Assessment Lower Extremity Assessment: Generalized weakness    Cervical / Trunk Assessment Cervical / Trunk Assessment: Normal  Communication   Communication: HOH  Cognition Arousal/Alertness: Awake/alert Behavior During Therapy: Flat affect Overall Cognitive Status: No family/caregiver present to determine baseline cognitive functioning                                 General Comments: requires repeated verbal/tactile cueing to participate        General Comments      Exercises     Assessment/Plan    PT Assessment Patient needs continued PT services  PT Problem List Decreased strength;Decreased activity tolerance;Decreased balance;Decreased mobility       PT Treatment Interventions DME instruction;Gait training;Functional mobility training;Therapeutic activities;Therapeutic exercise;Patient/family education;Wheelchair mobility training;Balance training    PT Goals (Current goals can be found in the Care Plan section)  Acute Rehab PT Goals Patient Stated Goal: not stated PT Goal Formulation: With patient Time For Goal Achievement: 07/31/22 Potential to Achieve Goals: Fair    Frequency Min 2X/week     Co-evaluation               AM-PAC PT "6 Clicks" Mobility  Outcome Measure Help needed turning from your back to your side while in a flat bed without using bedrails?: A Lot Help needed moving from lying on your back to sitting on the side of a flat  bed without using bedrails?: A Lot Help needed moving to and from a bed to a chair (including a wheelchair)?: Total Help needed standing up from a chair using your arms (e.g., wheelchair or bedside chair)?: Total Help needed to walk in hospital room?: Total Help needed climbing 3-5 steps with a railing? : Total 6 Click Score: 8    End of Session   Activity Tolerance: Patient tolerated treatment well;Patient limited by fatigue Patient left: in bed;with call bell/phone within reach Nurse Communication: Mobility status PT Visit Diagnosis: Unsteadiness on feet (R26.81);Other abnormalities of gait and mobility (R26.89);Muscle weakness (generalized) (M62.81)    Time: 7106-2694 PT Time Calculation (min) (ACUTE ONLY): 15 min   Charges:   PT Evaluation $PT Eval Low Complexity: 1 Low PT Treatments $Therapeutic Activity: 8-22 mins        3:49 PM, 07/17/22 Ocie Bob, MPT Physical Therapist with [facility] [phone] office [phone] mobile phone

## 2022-07-17 NOTE — Consult Note (Addendum)
Consultation Note Date: 07/17/2022   Patient Name: Robert Cardenas  DOB: 1926/03/19  MRN: [id number]  Age / Sex: 88 y.o., male  PCP: Carylon Perches, MD Referring Physician: Kendell Bane, MD  Reason for Consultation: Establishing goals of care and Hospice Evaluation  HPI/Patient Profile: 88 y.o. male  with past medical history of HTN, gout, hard of hearing, history of acute renal failure in 2016, balloon dilation with Dr. Karilyn Cota 2019/ERCP, laparoscopic cholecystectomy 2019, admitted on 07/13/2022 with septic shock/bacteremia with multiple wounds/UTI.   Clinical Assessment and Goals of Care: I have reviewed medical records including EPIC notes, labs and imaging, received report from RN, assessed the patient.  Robert Cardenas is lying quietly in bed.  He appears acutely/chronically ill and very frail and pale.  He looks ungroomed.  He clearly cannot make his basic needs known.  There is no family at bedside at this time.    Call to son, only contact listed, Robert Cardenas, Montez Hageman.  Left voicemail message on cell phone.  Left voicemail message on home phone.    Conference with bedside nursing staff, attending, physical therapy, transition of care team related to patient condition, needs.  Addendum: Return call from son, Robert Cardenas.  We talk in detail about Robert Cardenas home life.  Son states that at the end of December his cousin started taking food and groceries to Robert Cardenas 3 or 4 times a week.  She also help with paying bills.  He shares that his cousin told him 2 weeks ago that she could no longer assist with care.  Son states that he has been going regularly for the past 2 weeks.  He shares that his father has not been eating, he is (described as) forgetful, about food/meals and care. We also talk about his acute health concerns including, but not limited to, mental status, inability to safely take food and liquids,  bacteremia, wounds in multiple locations, acute kidney injury, low blood pressure.  At this point, son states that Dr. Selena Batten told him the other day that his father was "doing great".  I ask if son would be surprised if he is unable to get better, he replies "no".  I ask if Robert Cardenas would be okay living in a nursing home.  Son frequently has tangential thoughts, telling long stories without really answering questions.  He shares that he is on dialysis. We talk about the benefits of comfort and dignity, hospice care.  Son states that "I am unable to make choices".  He goes on later to say that if his father needed hospice care that he would not like it, but if it were "necessary". Team updated.  Palliative medicine team to meet with son hopefully tomorrow at bedside.   HCPOA  NEXT OF KIN -only contact listed his son, Office manager.    SUMMARY OF RECOMMENDATIONS   At this point continue to treat the treatable but no CPR or intubation, no central lines. Would clearly benefit from comfort and dignity at end-of-life, residential hospice.  Unable to reach responsible party/family for goals of care discussion   Code Status/Advance Care Planning: DNR  Symptom Management:  Per hospitalist, no additional needs at this time.  Palliative Prophylaxis:  Frequent Pain Assessment, Oral Care, and Palliative Wound Care  Additional Recommendations (Limitations, Scope, Preferences): Continue to treat the treatable but no CPR or intubation.  No central lines  Psycho-social/Spiritual:  Desire for further Chaplaincy support:no Additional Recommendations: Caregiving  Support/Resources and Education on Hospice  Prognosis:  Unable to determine, guarded.  Weeks would not be surprising.  Discharge Planning: To be determined would benefit from comfort and dignity at end-of-life, residential hospice.       Primary Diagnoses: Present on Admission:  Septic shock (HCC)  Hypernatremia  Elevated troponin  HTN  (hypertension)  Acute metabolic encephalopathy  Acute respiratory failure with hypoxia (HCC)  Hyperkalemia   I have reviewed the medical record, interviewed the patient and family, and examined the patient. The following aspects are pertinent.  Past Medical History:  Diagnosis Date   Acute renal failure (HCC)    04/2015   Gout    Hypertension    Social History   Socioeconomic History   Marital status: Widowed    Spouse name: Not on file   Number of children: Not on file   Years of education: Not on file   Highest education level: Not on file  Occupational History   Not on file  Tobacco Use   Smoking status: Never   Smokeless tobacco: Never  Substance and Sexual Activity   Alcohol use: No   Drug use: No   Sexual activity: Not Currently  Other Topics Concern   Not on file  Social History Narrative   Not on file   Social Determinants of Health   Financial Resource Strain: Not on file  Food Insecurity: Not on file  Transportation Needs: Not on file  Physical Activity: Not on file  Stress: Not on file  Social Connections: Not on file   History reviewed. No pertinent family history. Scheduled Meds:  Chlorhexidine Gluconate Cloth  6 each Topical Q0600   collagenase   Topical Daily   Continuous Infusions:  sodium chloride Stopped (07/14/22 0730)   sodium chloride Stopped (07/14/22 0102)   cefTRIAXone (ROCEPHIN)  IV Stopped (07/17/22 0829)   dextrose 5 % and 0.45% NaCl 125 mL/hr at 07/17/22 1129   heparin 1,100 Units/hr (07/17/22 1313)   norepinephrine (LEVOPHED) Adult infusion Stopped (07/16/22 1634)   potassium chloride 10 mEq (07/17/22 1350)   PRN Meds:.acetaminophen **OR** acetaminophen, ondansetron **OR** ondansetron (ZOFRAN) IV, polyethylene glycol Medications Prior to Admission:  Prior to Admission medications   Medication Sig Start Date End Date Taking? Authorizing Provider  acetaminophen (TYLENOL) 325 MG tablet Take 2 tablets (650 mg total) by mouth  every 6 (six) hours as needed for mild pain (or Fever >/= 101). 04/17/15   Carylon Perches, MD  allopurinol (ZYLOPRIM) 100 MG tablet Take 200 mg by mouth daily.    [provider]  amLODipine (NORVASC) 5 MG tablet Take 5 mg by mouth daily.    [provider]  aspirin EC 81 MG tablet Take 81 mg by mouth daily.    [provider]  doxazosin (CARDURA) 8 MG tablet Take 4 mg by mouth daily.     [provider]  vitamin C (ASCORBIC ACID) 500 MG tablet Take 500 mg by mouth daily.    [provider]   No Known Allergies Review of Systems  Unable to  perform ROS: Acuity of condition    Physical Exam Vitals and nursing note reviewed.  Constitutional:      General: He is not in acute distress.    Appearance: He is ill-appearing.  Cardiovascular:     Rate and Rhythm: Normal rate.  Pulmonary:     Effort: Pulmonary effort is normal. No respiratory distress.  Skin:    General: Skin is warm and dry.     Coloration: Skin is pale.  Psychiatric:     Comments: Lethargic, opens eyes but does not interact     Vital Signs: BP (!) 102/34   Pulse (!) 59   Temp (!) 97.5 F (36.4 C)   Resp (!) 25   Ht 5\' 9"  (1.753 m)   Wt 70.1 kg   SpO2 94%   BMI 22.82 kg/m  Pain Scale: PAINAD POSS *See Group Information*: S-Acceptable,Sleep, easy to arouse     SpO2: SpO2: 94 % O2 Device:SpO2: 94 % O2 Flow Rate: .O2 Flow Rate (L/min): 2 L/min  IO: Intake/output summary:  Intake/Output Summary (Last 24 hours) at 07/17/2022 1444 Last data filed at 07/17/2022 1300 Gross per 24 hour  Intake 2913.8 ml  Output 1426 ml  Net 1487.8 ml    LBM: Last BM Date : 07/17/22 Baseline Weight: Weight: 65.8 kg Most recent weight: Weight: 70.1 kg     Palliative Assessment/Data:   Flowsheet Rows    Flowsheet Row Most Recent Value  Intake Tab   Referral Department Hospitalist  Unit at Time of Referral ICU  Palliative Care Primary Diagnosis Sepsis/Infectious Disease  Date  Notified 07/17/22  Palliative Care Type New Palliative care  Reason for referral Clarify Goals of Care  Date of Admission 07/13/22  Date first seen by Palliative Care 07/17/22  # of days Palliative referral response time 0 Day(s)  # of days IP prior to Palliative referral 4  Clinical Assessment   Palliative Performance Scale Score 20%  Pain Max last 24 hours Not able to report  Pain Min Last 24 hours Not able to report  Dyspnea Max Last 24 Hours Not able to report  Dyspnea Min Last 24 hours Not able to report  Psychosocial & Spiritual Assessment   Palliative Care Outcomes        Time In: 1430 Time Out: 1545 Time Total: 75 minutes Greater than 50%  of this time was spent counseling and coordinating care related to the above assessment and plan.  Signed by: 07/19/22, NP   Please contact Palliative Medicine Team phone at [phone] for questions and concerns.  For individual provider: See [phone]

## 2022-07-17 NOTE — Progress Notes (Signed)
Performed bedside swallow screen, patient does not stop drinking, but does start coughing and experienced a brief drop in oxygen saturation to 88%. Failed YALE swallow screen, SLP consult already placed, will continue to monitor.

## 2022-07-17 NOTE — Progress Notes (Addendum)
Progress Note   Patient: Robert Cardenas PPJ:[id number] DOB: [birth year] DOA: 07/13/2022     4 DOS: the patient was seen and examined on 07/17/2022    Subjective:  The patient was seen and examined this morning, awake but does not communicate verbally does not follow any command.  Few words was understood on demand for possibly water otherwise speech is unclear     Brief hospital course: negativeRichard R Cardenas is a 88 year old male with a history of CKD, hypertension, prostate cancer status post brachytherapy, gouty arthritis presented when he the patient was found unresponsive at home with agonal respirations.  The patient remained somnolent and obtunded in the emergency department and was unable to provide any significant history.  History is obtained from review of the medical record and speaking with the patient's son.  According to the patient's son, the patient was last seen well approximately 2 days prior to this admission.  Apparently, there has been concern about the patient having cognitive impairment however the patient has been stubborn in terms of receiving medical care.  The patient lives by himself and has poor oral intake.  Apparently, the patient lives on a "tea and toast diet".  On initial arrival in the emergency department, nursing staff was concerned that they were unable to palpate a pulse and initiated CPR with chest compressions for less than 1 minute.  Subsequently, the patient was felt to have a faint femoral pulse with a blood pressure of 56/34.  Vasopressors were initially started.  The patient was given 2 L fluid boluses after which he was able to be weaned off vasopressors initially. The patient had low-grade temperature 100.4 F with tachypnea and hypotension initially.  WBC 20.5, hemoglobin 13.8, platelets 101,000.  Sodium 163, potassium 5.3, bicarbonate 16, BUN 144, serum creatinine 6.07.  Lactic acid peaked at 7.9>> 2.7.  CT brain was negative.  Chest x-ray showed  bibasilar atelectasis, right greater than left.  ABG initially showed 7.3 7/30/175/17 on 2 L.  Troponins were 206>> 231.  EKG showed sinus rhythm and nonspecific T wave changes in the lateral leads.  UA showed 21-50 WBC,>50RBC.  The patient was started on vancomycin, cefepime, metronidazole.  Assessment and Plan:   Septic shock (HCC)/bacteremic with Enterobacter and Proteus  mild improvement in sepsis physiology, still tachypneic blood pressure soft 69/30 this morning, repeat BP 102/34, satting 94%   On admission patient found unresponsive, meeting sepsis criteria with hypothermia- temperature down to 94.3,  leukocytosis of 20, tachycardia heart rate ranging from 25-134 (bradycardia may be secondary to hypothermia), with tachypnea heart rate 21-31.  Hypotensive down to 56/34 improved initially with pressors and now with fluids.  Evidence of endorgan dysfunctionfailure and encephalopathy.   -Source of sepsis likely UTI  -Broad-spectrum antibiotics IV vancomycin cefepime initiated  -Family has elected for DNR/DNI, no central lines, but otherwise with full scope of treatment  -2.25 L bolus given, continue 1/2 N/s 125cc/hr x 1 day -ABG 7.37/30/175/17 on 2L -Blood culture 8/10-> +Enterobacterales, Proteus -Urine culture 8/10->  + Lactobacillus -C. Diff 8/12-> negative  Vanco 8/10->8/12 Cefepime 8/10->8/12 Ceftriaxone 2gm iv qday 8/12->>>     Acute metabolic encephalopathy -Still confused but awake, minimal words otherwise unclear speech does not follow commands  -Likely worsening in setting of baseline dementia, now acute illness of sepsis/septic shock, hypovolemia and azotemia with BUN of 144.  Head CT without acute abnormality, shows chronic microvascular ischemic changes and cerebral volume loss.  Son reports likely undiagnosed dementia. -EEG 8/12=> nonspecific,  suggestive of moderate diffuse encephalopathy. No seizure activity -Ammonia 21 -Cont iv abx, see above -Remain n.p.o. -CBG  Q8h - pt was able to ask for water this am, appears to be improving   Elevated troponin Troponin elevated at 206.  Likely demand ischemia from severe sepsis and hypovolemia   EKG with T wave changes in lead I, aVL, V5, V6.   Ddx ARF on CKD stage3b - Echo (8/11) EF 60-65%   Hypernatremia -Sodium 152, 147, Improving, 1/2 ns iv -> D51/2 ns iv Check cmp in am   Goals of care, counseling/discussion Son confirms DNR/DNI    Pyuria UA with 21-50WBC Urine culture 8/10-> 100,000 Lactobacillus Blood culture 8/10-> + Enterobacter, Proteus MRSA screen negative Cont Abx (Rocephine 2gm iv qday 8/12=>>), see above   Lactic acidosis Due to sepsis and hypoxia Continue IVF   Acute renal failure superimposed on stage 3b chronic kidney disease (HCC) Baseline creatinine 1.4-1.6 Presented with serum creatinine 6.07 >>> 4.16 Due to hemodynamic changes/ATN, volume depletion   Acute respiratory failure with hypoxia (HCC) O2 sats down to 72% on room air and tachypnea -likely a degree of hypoventilation and possible aspiration pneumonitis -Obtain ABG 7.37/30/175/17 on 2L -stable on 2L -DNR, DNI -Right lower extremity venous Dopplers (8/11)-> no acute DVT, nonocclusive eccentric wall thickening most consistent with chronic DVT in the RLE involving the femoral vein, popliteal vein and proximal peroneal vein -VQ scan 8/12 => intermediate probability of Pulmonary Embolism (PE) Start Heparin iv pharmacy to dose 8/12 I discussed with son 8/12 that there is increased risk of bleeding, he wishes to proceed for now With anticoagulation-heparin   Hypernatremia Improving w IVF Check cmp in am   Hyperkalemia Resolved Remain on tele   HTN (hypertension) History of hypertension.  Blood pressure initially 56/34, initially required pressors  -Continue Levophed for MAP less than 65 -Cont IVF Fluids -Holding Norvasc, Doxazosin   Vitamin B12 Deficiency Start vitamin b12 1076micrograms IM qday    Thrombocytopenia  Monitoring platelets -Platelets 69, 60 No signs of bleeding   Abnormal liver function Check cmp in am Consider RUQ ultrasound if worsening     Ethics: DNR/DNI Palliative care consulted to further establish goals of care In the face of comorbidities, dementia, prognosis remain poor.    Family Communication: son updated 8/12, 8/13, 8/14    Consultants:  none   Code Status:  DNR/DNI    DVT Prophylaxis:  Heparin     Procedures: As Listed in Progress Note Above   Antibiotics: Vanc 8/10>>8/12 Cefepime 8/10>>8/12 Ceftriaxone 8/12->     The patient is critically ill with multiple organ systems failure and requires high complexity decision making for assessment and support, frequent evaluation and titration of therapies, application of advanced monitoring technologies and extensive interpretation of multiple databases.  Critical care time - 55 mins.   .   Physical Exam: Blood pressure (!) 132/44, pulse 62, temperature 97.7 F (36.5 C), resp. rate (!) 25, height 5\' 9"  (1.753 m), weight 70.1 kg, SpO2 95 %.    Physical Exam:   General:  Awake, nonverbal, does not follow any commands, confused, demented  HEENT:  Normocephalic, PERRL, otherwise with in Normal limits   Neuro:  CNII-XII intact. , normal motor and sensation, reflexes intact   Lungs:   Clear to auscultation BL, Respirations unlabored,  No wheezes / crackles  Cardio:    S1/S2, RRR, No murmure, No Rubs or Gallops   Abdomen:  Soft, non-tender, bowel sounds active all four quadrants, no  guarding or peritoneal signs.  Muscular  skeletal:  Limited exam -severe global generalized weaknesses - in bed, able to move all 4 extremities,   2+ pulses,  symmetric, No pitting edema  Skin:  Dry, warm to touch, negative for any Rashes,  Wounds: Please see nursing documentation  Pressure Injury 07/14/22 Buttocks (Active)  07/14/22 0225  Location: Buttocks  Location Orientation:   Staging:   Wound  Description (Comments):   Present on Admission:   Dressing Type Foam - Lift dressing to assess site every shift 07/17/22 1351         Data Reviewed:    Latest Ref Rng & Units 07/17/2022    3:43 AM 07/16/2022    3:25 AM 07/15/2022    3:40 AM  CBC  WBC 4.0 - 10.5 K/uL 11.9  16.2  19.0   Hemoglobin 13.0 - 17.0 g/dL 33.2  95.1  88.4   Hematocrit 39.0 - 52.0 % 34.7  38.4  42.8   Platelets 150 - 400 K/uL 60  69  64       Latest Ref Rng & Units 07/17/2022    3:43 AM 07/16/2022    3:25 AM 07/15/2022    3:40 AM  BMP  Glucose 70 - 99 mg/dL 166  063  016   BUN 8 - 23 mg/dL 010  932  355   Creatinine 0.61 - 1.24 mg/dL 7.32  2.02  5.42   Sodium 135 - 145 mmol/L 147  152  156   Potassium 3.5 - 5.1 mmol/L 3.3  3.7  4.2   Chloride 98 - 111 mmol/L 125  126  129   CO2 22 - 32 mmol/L 15  13  14    Calcium 8.9 - 10.3 mg/dL 7.3  7.4  7.4        Author: , MD 07/17/2022 2:03 PM  For on call review [email or web address].

## 2022-07-18 DIAGNOSIS — Z7189 Other specified counseling: Secondary | ICD-10-CM | POA: Diagnosis not present

## 2022-07-18 DIAGNOSIS — A419 Sepsis, unspecified organism: Secondary | ICD-10-CM | POA: Diagnosis not present

## 2022-07-18 DIAGNOSIS — R6521 Severe sepsis with septic shock: Secondary | ICD-10-CM | POA: Diagnosis not present

## 2022-07-18 DIAGNOSIS — Z515 Encounter for palliative care: Secondary | ICD-10-CM | POA: Diagnosis not present

## 2022-07-18 LAB — BASIC METABOLIC PANEL
Anion gap: 10 (ref 5–15)
BUN: 115 mg/dL — ABNORMAL HIGH (ref 8–23)
CO2: 14 mmol/L — ABNORMAL LOW (ref 22–32)
Calcium: 7.3 mg/dL — ABNORMAL LOW (ref 8.9–10.3)
Chloride: 121 mmol/L — ABNORMAL HIGH (ref 98–111)
Creatinine, Ser: 3.59 mg/dL — ABNORMAL HIGH (ref 0.61–1.24)
GFR, Estimated: 15 mL/min — ABNORMAL LOW (ref >60)
Glucose, Bld: 233 mg/dL — ABNORMAL HIGH (ref 70–99)
Potassium: 3.9 mmol/L (ref 3.5–5.1)
Sodium: 145 mmol/L (ref 135–145)

## 2022-07-18 LAB — CBC
HCT: 30.1 % — ABNORMAL LOW (ref 39.0–52.0)
Hemoglobin: 9.3 g/dL — ABNORMAL LOW (ref 13.0–17.0)
MCH: 33.1 pg (ref 26.0–34.0)
MCHC: 30.9 g/dL (ref 30.0–36.0)
MCV: 107.1 fL — ABNORMAL HIGH (ref 80.0–100.0)
Platelets: 50 10*3/uL — ABNORMAL LOW (ref 150–400)
RBC: 2.81 MIL/uL — ABNORMAL LOW (ref 4.22–5.81)
RDW: 14.6 % (ref 11.5–15.5)
WBC: 10.3 10*3/uL (ref 4.0–10.5)
nRBC: 0 % (ref 0.0–0.2)

## 2022-07-18 LAB — CULTURE, BLOOD (ROUTINE X 2): Culture: NO GROWTH

## 2022-07-18 LAB — GLUCOSE, CAPILLARY: Glucose-Capillary: 221 mg/dL — ABNORMAL HIGH (ref 70–99)

## 2022-07-18 LAB — HEPARIN LEVEL (UNFRACTIONATED): Heparin Unfractionated: 0.3 IU/mL (ref 0.30–0.70)

## 2022-07-18 NOTE — Progress Notes (Addendum)
ANTICOAGULATION CONSULT NOTE  Pharmacy Consult for heparin  Indication: pulmonary embolus  No Known Allergies  Patient Measurements: Height: 5\' 9"  (175.3 cm) Weight: 76.5 kg (168 lb 10.4 oz) IBW/kg (Calculated) : 70.7 HEPARIN DW (KG): 65.5  Vital Signs: Temp: 97.5 F (36.4 C) (08/15 1030) Temp Source: Bladder (08/15 0900) BP: 134/67 (08/15 1030) Pulse Rate: 75 (08/15 1030)  Labs: Recent Labs    07/16/22 0325 07/16/22 0356 07/17/22 0343 07/18/22 0346 07/18/22 0939  HGB 12.3*  --  11.3* 9.3*  --   HCT 38.4*  --  34.7* 30.1*  --   PLT 69*  --  60* 50*  --   HEPARINUNFRC 0.66  --  0.60  --  0.30  CREATININE 4.98*  --  4.16* 3.59*  --   CKTOTAL  --  949*  --   --   --   CKMB  --  32.6*  --   --   --      Estimated Creatinine Clearance: 12.6 mL/min (A) (by C-G formula based on SCr of 3.59 mg/dL (H)).  Assessment: 88 y.o. male with PE for heparin  HL therapeutic  Goal of Therapy:  Heparin level 0.3-0.7 units/ml Monitor platelets by anticoagulation protocol: Yes   Plan:  Continue Heparin at 1100 units/hr Check anti-Xa level daily while on heparin Continue to monitor H&H and platelets MD anticipating DC of heparin due to hemoglobin/platelets  97, PharmD Clinical Pharmacist 07/18/2022 11:43 AM

## 2022-07-18 NOTE — Progress Notes (Signed)
Speech Language Pathology Treatment: Dysphagia  Patient Details Name: Robert Cardenas MRN: [id number] DOB: [birth year] Today's Date: 07/18/2022 Time: 5852-7782 SLP Time Calculation (min) (ACUTE ONLY): 33 min  Assessment / Plan / Recommendation Clinical Impression  Ongoing diagnostic dysphagia therapy provided with Pt's son present. PT with improved mentation and alertness today. Pt continues to demonstrate overt coughing and wet seemingly delayed swallow upon palpation with thin liquids. Note no coughing or wet vocal quality with NTL. Pt consumed puree textures without facial grimacing or coughing. Recommend initiate diet of D1/puree diet and NECTAR thick liquids. SLP will continue to follow for possible need for instrumental assessment or subjective upgrade. Above to RN.    HPI HPI: Robert Cardenas is a 88 y.o. male with medical history significant for HTN, Gout, hard of hearing.    Patient was brought to the ED via EMS after patient was found at home by his son unresponsive and with agonal respiration.  At the time of my evaluation, patient appears conscious-with eyes open, intermittently moving his head from side-to-side, moans a little, but not following directions, a flicker of movement of upper extremities to pain.   Per chart review, ED provider talked to patient's son,-Joanna Hall Hasten, [place] who is patient's only living direct relative, patient was last seen well, and in his normal state of health about 2 days ago.  He lives by himself, on tea and toast diet.    I talked to patients son on the phone, according to son, patient has declined over the past several months, and was trying to establish home care for patient, and likely has undiagnosed oral intake.  Patient was found unresponsive by his son, EMS found patient covered in urine, and were initially unsure if patient had a pulse.      SLP Plan  Continue with current plan of care      Recommendations for follow up therapy are one component  of a multi-disciplinary discharge planning process, led by the attending physician.  Recommendations may be updated based on patient status, additional functional criteria and insurance authorization.    Recommendations  Diet recommendations: Dysphagia 1 (puree);Nectar-thick liquid Liquids provided via: Cup;Teaspoon Medication Administration: Crushed with puree Supervision: Full supervision/cueing for compensatory strategies Compensations: Minimize environmental distractions;Slow rate;Small sips/bites                Oral Care Recommendations: Oral care BID;Oral care prior to ice chip/H20 SLP Visit Diagnosis: Dysphagia, unspecified (R13.10) Plan: Continue with current plan of care          Emery Dupuy H. Romie Levee, CCC-SLP Speech Language Pathologist  [name]  07/18/2022, 11:53 AM

## 2022-07-18 NOTE — NC FL2 (Signed)
[place] MEDICAID FL2 LEVEL OF CARE SCREENING TOOL     IDENTIFICATION  Patient Name: Robert Cardenas Birthdate: 1926-02-02 Sex: male Admission Date (Current Location): 07/13/2022  [facility] and [place] Number:  [employer] and Address:  [facility],  [address] [place] [address]      Provider Number: [id number]  Attending Physician Name and Address:  Kendell Bane, MD  Relative Name and Phone Number:       Current Level of Care: Hospital Recommended Level of Care: Skilled Nursing Facility Prior Approval Number:    Date Approved/Denied:   PASRR Number: [id number]  Discharge Plan: SNF    Current Diagnoses: Patient Active Problem List   Diagnosis Date Noted   Acute renal failure superimposed on stage 3b chronic kidney disease (HCC) 07/14/2022   Lactic acidosis 07/14/2022   Pyuria 07/14/2022   Goals of care, counseling/discussion 07/14/2022   Septic shock (HCC) 07/13/2022   Hypernatremia 07/13/2022   Acute metabolic encephalopathy 07/13/2022   Acute respiratory failure with hypoxia (HCC) 07/13/2022   Sepsis due to Escherichia coli (E. coli) (HCC)    Cardiac arrhythmia    Choledocholithiasis    Sepsis (HCC) 05/10/2018   Cholelithiasis 05/10/2018   Elevated LFTs 05/10/2018   Elevated troponin 05/10/2018   Cholangitis    Hyperkalemia 04/15/2015   Gout    Chest pain 08/21/2014   HTN (hypertension) 08/21/2014    Orientation RESPIRATION BLADDER Height & Weight     Self  Normal Incontinent Weight: 168 lb 10.4 oz (76.5 kg) Height:  5\' 9"  (175.3 cm)  BEHAVIORAL SYMPTOMS/MOOD NEUROLOGICAL BOWEL NUTRITION STATUS      Incontinent Diet (see dc summary)  AMBULATORY STATUS COMMUNICATION OF NEEDS Skin   Extensive Assist Verbally PU Stage and Appropriate Care (pressure injury L heel, R ankle, buttocks)                       Personal Care Assistance Level of Assistance  Bathing, Feeding, Dressing Bathing Assistance: Maximum  assistance Feeding assistance: Limited assistance Dressing Assistance: Maximum assistance     Functional Limitations Info  Sight, Hearing, Speech Sight Info: Adequate Hearing Info: Adequate Speech Info: Impaired    SPECIAL CARE FACTORS FREQUENCY  PT (By licensed PT), OT (By licensed OT)     PT Frequency: 5x week OT Frequency: 3x week            Contractures Contractures Info: Not present    Additional Factors Info  Code Status, Allergies Code Status Info: DNR Allergies Info: NKA           Current Medications (07/18/2022):  This is the current hospital active medication list Current Facility-Administered Medications  Medication Dose Route Frequency Provider Last Rate Last Admin   0.9 %  sodium chloride infusion  250 mL Intravenous Continuous Emokpae, Ejiroghene E, MD   Held at 07/14/22 0102   acetaminophen (TYLENOL) tablet 650 mg  650 mg Oral Q6H PRN Emokpae, Ejiroghene E, MD       Or   acetaminophen (TYLENOL) suppository 650 mg  650 mg Rectal Q6H PRN Emokpae, Ejiroghene E, MD       cefTRIAXone (ROCEPHIN) 2 g in sodium chloride 0.9 % 100 mL IVPB  2 g Intravenous Q24H 09/13/22, MD   Stopped at 07/18/22 0901   Chlorhexidine Gluconate Cloth 2 % PADS 6 each  6 each Topical Q0600 Emokpae, Ejiroghene E, MD   6 each at 07/18/22 0000   collagenase (SANTYL)  ointment   Topical Daily Pearson Grippe, MD   Given at 07/18/22 1015   dextrose 5 %-0.45 % sodium chloride infusion   Intravenous Continuous Pearson Grippe, MD 125 mL/hr at 07/18/22 1030 New Bag at 07/18/22 1030   heparin ADULT infusion 100 units/mL (25000 units/252mL)  1,100 Units/hr Intravenous Continuous Coffee, Gerre Pebbles, RPH 11 mL/hr at 07/18/22 1201 1,100 Units/hr at 07/18/22 1201   norepinephrine (LEVOPHED) 4mg  in (0.016 mg/mL) premix infusion  2-10 mcg/min Intravenous Titrated Emokpae, Ejiroghene E, MD   Stopped at 07/16/22 1634   ondansetron (ZOFRAN) tablet 4 mg  4 mg Oral Q6H PRN Emokpae, Ejiroghene E, MD       Or    ondansetron (ZOFRAN) injection 4 mg  4 mg Intravenous Q6H PRN Emokpae, Ejiroghene E, MD       polyethylene glycol (MIRALAX / GLYCOLAX) packet 17 g  17 g Oral Daily PRN Emokpae, Ejiroghene E, MD         Discharge Medications: Please see discharge summary for a list of discharge medications.  Relevant Imaging Results:  Relevant Lab Results:   Additional Information SSN: [id number]  07/18/22, LCSW

## 2022-07-18 NOTE — Progress Notes (Signed)
Progress Note   Patient: Robert Cardenas [id number] DOB: 1926/03/31 DOA: 07/13/2022     5 DOS: the patient was seen and examined on 07/18/2022    Subjective:  The patient was seen and examined this morning, somnolent, lethargic Otherwise hemodynamically stable     Brief hospital course: negativeRichard R Bussiere is a 88 year old male with a history of CKD, hypertension, prostate cancer status post brachytherapy, gouty arthritis presented when he the patient was found unresponsive at home with agonal respirations.  The patient remained somnolent and obtunded in the emergency department and was unable to provide any significant history.  History is obtained from review of the medical record and speaking with the patient's son.  According to the patient's son, the patient was last seen well approximately 2 days prior to this admission.  Apparently, there has been concern about the patient having cognitive impairment however the patient has been stubborn in terms of receiving medical care.  The patient lives by himself and has poor oral intake.  Apparently, the patient lives on a "tea and toast diet".  On initial arrival in the emergency department, nursing staff was concerned that they were unable to palpate a pulse and initiated CPR with chest compressions for less than 1 minute.  Subsequently, the patient was felt to have a faint femoral pulse with a blood pressure of 56/34.  Vasopressors were initially started.  The patient was given 2 L fluid boluses after which he was able to be weaned off vasopressors initially. The patient had low-grade temperature 100.4 F with tachypnea and hypotension initially.  WBC 20.5, hemoglobin 13.8, platelets 101,000.  Sodium 163, potassium 5.3, bicarbonate 16, BUN 144, serum creatinine 6.07.  Lactic acid peaked at 7.9>> 2.7.  CT brain was negative.  Chest x-ray showed bibasilar atelectasis, right greater than left.  ABG initially showed 7.3 7/30/175/17 on 2 L.   Troponins were 206>> 231.  EKG showed sinus rhythm and nonspecific T wave changes in the lateral leads.  UA showed 21-50 WBC,>50RBC.  The patient was started on vancomycin, cefepime, metronidazole.  Assessment and Plan:   Septic shock (HCC)/bacteremic with Enterobacter and Proteus  Sepsis shock, sepsis physiology-improving  On admission patient found unresponsive, meeting sepsis criteria with hypothermia- temperature down to 94.3,  leukocytosis of 20, tachycardia heart rate ranging from 25-134 (bradycardia may be secondary to hypothermia), with tachypnea heart rate 21-31.  Hypotensive down to 56/34 improved initially with pressors and now with fluids.  Evidence of endorgan dysfunctionfailure and encephalopathy.   -Source of sepsis likely UTI  -Broad-spectrum antibiotics IV vancomycin cefepime initiated  -Family has elected for DNR/DNI, no central lines, but otherwise with full scope of treatment Discussed and updated his son regarding current finding and treatment plan.  - status posttreatment with IV fluid-2.25 L bolus given, continue 1/2 N/s 125cc/hr x 1 day -ABG 7.37/30/175/17 on 2L -Blood culture 8/10-> +Enterobacterales, Proteus -Urine culture 8/10->  + Lactobacillus -C. Diff 8/12-> negative  Vanco 8/10->8/12 Cefepime 8/10->8/12 Ceftriaxone 2gm iv qday 8/12->>>     Acute metabolic encephalopathy -Somnolent, lethargic this a.m.  -Likely worsening in setting of baseline dementia, now acute illness of sepsis/septic shock, hypovolemia and azotemia with BUN of 144.  Head CT without acute abnormality, shows chronic microvascular ischemic changes and cerebral volume loss.  Son reports likely undiagnosed dementia. -EEG 8/12=> nonspecific, suggestive of moderate diffuse encephalopathy. No seizure activity -Ammonia 21 -Cont iv abx, see above -Remain n.p.o.... Speech evaluating possible MBS today  -CBG Q8h  Elevated troponin Troponin elevated at 206.  Likely demand ischemia from  severe sepsis and hypovolemia   EKG with T wave changes in lead I, aVL, V5, V6.   Ddx ARF on CKD stage3b - Echo (8/11) EF 60-65%   Hypernatremia -Sodium 152, 147, 145, Improving, 1/2 ns iv -> D51/2 ns iv Check cmp in am   Goals of care, counseling/discussion Son confirms DNR/DNI  Ongoing discussion with patient's POA his son   Pyuria UA with 21-50WBC Urine culture 8/10-> 100,000 Lactobacillus Blood culture 8/10-> + Enterobacter, Proteus MRSA screen negative Cont Abx (Rocephine 2gm iv qday 8/12=>>), see above   Lactic acidosis Likely due to sepsis, hypoxia-resolved   Acute renal failure superimposed on stage 3b chronic kidney disease (HCC) Baseline creatinine 1.4-1.6 Presented with serum creatinine 6.07 >>> 4.16 >>> 3.59 Due to hemodynamic changes/ATN, volume depletion   Acute respiratory failure with hypoxia (HCC) -Improved currently on room air satting 97% -Been off supplemental oxygen O2 sats down to 72% on room air and tachypnea -likely a degree of hypoventilation and possible aspiration pneumonitis -Obtain ABG 7.37/30/175/17 on 2L -DNR, DNI -Right lower extremity venous Dopplers (8/11)-> no acute DVT, nonocclusive eccentric wall thickening most consistent with chronic DVT in the RLE involving the femoral vein, popliteal vein and proximal peroneal vein -VQ scan 8/12 => intermediate probability of Pulmonary Embolism (PE) Start Heparin iv pharmacy to dose 8/12 I discussed with son 8/12 that there is increased risk of bleeding, he wishes to proceed for now With anticoagulation-heparin >>> platelet dropping to 50, anticipating discontinuing heparin   Hypernatremia Improving w IVF Monitoring with daily labs   Hyperkalemia Resolved Remain on tele   HTN (hypertension) History of hypertension.  His blood pressure improved, mildly hypertensive now  Was hypotensive-lood pressure initially 56/34, initially required pressors  -Continue Levophed for MAP less than 65 -Cont  IVF Fluids -Holding Norvasc, Doxazosin   Vitamin B12 Deficiency Start vitamin b12 IM qday   Thrombocytopenia  Monitoring platelets -Platelets 69, 60 >>>50 No signs of bleeding -anticipating discontinuing heparin drip   Abnormal liver function Check cmp daily Consider RUQ ultrasound if worsening     Ethics: DNR/DNI Palliative care consulted to further establish goals of care In the face of comorbidities, dementia, prognosis remain poor.    Family Communication: son updated 8/12, 8/13, 8/14    Consultants:  none   Code Status:  DNR/DNI    DVT Prophylaxis:  Heparin     Procedures: As Listed in Progress Note Above   Antibiotics: Vanc 8/10>>8/12 Cefepime 8/10>>8/12 Ceftriaxone 8/12->     The patient is critically ill with multiple organ systems failure and requires high complexity decision making for assessment and support, frequent evaluation and titration of therapies, application of advanced monitoring technologies and extensive interpretation of multiple databases.  Critical care time - 55 mins.   .   Physical Exam: Blood pressure (!) 132/44, pulse 62, temperature 97.7 F (36.5 C), resp. rate (!) 25, height 5\' 9"  (1.753 m), weight 70.1 kg, SpO2 95 %.      Physical Exam:   General:  Somnolent, lethargic cooperative, no distress; nonverbal  HEENT:  Normocephalic, PERRL, otherwise with in Normal limits   Neuro:  Limited exam somnolent  Otherwise CNII-XII seems to be intact. , normal motor and sensation, reflexes intact   Lungs:   Clear to auscultation BL, Respirations unlabored,  No wheezes / crackles  Cardio:    S1/S2, RRR, No murmure, No Rubs or Gallops   Abdomen:  Soft, non-tender, bowel sounds active all four quadrants, no guarding or peritoneal signs.  Muscular  skeletal:  Limited exam -global generalized weaknesses - in bed, able to move all 4 extremities,   2+ pulses,  symmetric, No pitting edema  Skin:  Dry, warm to touch,  negative for any Rashes,  Wounds: Please see nursing documentation  Pressure Injury 07/14/22 Buttocks (Active)  07/14/22 0225  Location: Buttocks  Location Orientation:   Staging:   Wound Description (Comments):   Present on Admission:   Dressing Type Foam - Lift dressing to assess site every shift;Santyl 07/18/22 1004            Data Reviewed:    Latest Ref Rng & Units 07/18/2022    3:46 AM 07/17/2022    3:43 AM 07/16/2022    3:25 AM  CBC  WBC 4.0 - 10.5 K/uL 10.3  11.9  16.2   Hemoglobin 13.0 - 17.0 g/dL 9.3  49.7  02.6   Hematocrit 39.0 - 52.0 % 30.1  34.7  38.4   Platelets 150 - 400 K/uL 50  60  69       Latest Ref Rng & Units 07/18/2022    3:46 AM 07/17/2022    3:43 AM 07/16/2022    3:25 AM  BMP  Glucose 70 - 99 mg/dL 378  588  502   BUN 8 - 23 mg/dL 774  128  786   Creatinine 0.61 - 1.24 mg/dL 7.67  2.09  4.70   Sodium 135 - 145 mmol/L 145  147  152   Potassium 3.5 - 5.1 mmol/L 3.9  3.3  3.7   Chloride 98 - 111 mmol/L 121  125  126   CO2 22 - 32 mmol/L 14  15  13    Calcium 8.9 - 10.3 mg/dL 7.3  7.3  7.4        Author: , MD 07/18/2022 10:38 AM  For on call review [email or web address].

## 2022-07-18 NOTE — TOC Progression Note (Signed)
Transition of Care [facility]) - Progression Note    Patient Details  Name: Robert Cardenas MRN: [id number] Date of Birth: 1926/02/27  Transition of Care [facility]) CM/SW Contact  Elliot Gault, LCSW Phone Number: 07/18/2022, 1:41 PM  Clinical Narrative:     TOC following. PT recommending SNF rehab at dc. Per MD, pt medically needing 3 or more days of continued stay. Pt oriented to self. Will follow up with pt's son to review SNF provider options and will refer. Pt will need insurance authorization for SNF initiated when pt closer to being ready for dc.  TOC will follow.    Barriers to Discharge: Continued Medical Work up  Expected Discharge Plan and Services                                                 Social Determinants of Health (SDOH) Interventions    Readmission Risk Interventions     No data to display

## 2022-07-18 NOTE — Progress Notes (Signed)
Palliative: [name] is lying quietly in bed.  He appears acutely/chronically ill and very frail.  He is more alert than yesterday.  He is difficult to understand, but is able to tell me his name, and that we are in the hospital.  His son, Robert Cardenas, is present at bedside.  We talk about [name] acute illness and the treatment plan.  We talk about speech therapy consult and plan.  We talk about fluids and medications.  We talk about [name] wounds, son Robert Cardenas is able to see a photo of his sacral wound.  We talk about a few "what if's and maybe's".  In particular around [name] expected continued declines and frailty.  Conference with attending, bedside nursing staff, transition of care team related to patient condition, needs, goals of care, disposition.  Plan: At this point continue to treat the treatable but no CPR or intubation, no central lines.  Time for outcomes.  50 minutes Robert Carmel, NP Palliative medicine team Team phone [phone] Greater than 50% of this time was spent counseling and coordinating care related to the above assessment and plan.

## 2022-07-19 DIAGNOSIS — Z7189 Other specified counseling: Secondary | ICD-10-CM | POA: Diagnosis not present

## 2022-07-19 DIAGNOSIS — A419 Sepsis, unspecified organism: Secondary | ICD-10-CM | POA: Diagnosis not present

## 2022-07-19 DIAGNOSIS — R6521 Severe sepsis with septic shock: Secondary | ICD-10-CM | POA: Diagnosis not present

## 2022-07-19 DIAGNOSIS — Z515 Encounter for palliative care: Secondary | ICD-10-CM | POA: Diagnosis not present

## 2022-07-19 LAB — BASIC METABOLIC PANEL
Anion gap: 6 (ref 5–15)
BUN: 100 mg/dL — ABNORMAL HIGH (ref 8–23)
CO2: 15 mmol/L — ABNORMAL LOW (ref 22–32)
Calcium: 7.4 mg/dL — ABNORMAL LOW (ref 8.9–10.3)
Chloride: 125 mmol/L — ABNORMAL HIGH (ref 98–111)
Creatinine, Ser: 3.2 mg/dL — ABNORMAL HIGH (ref 0.61–1.24)
GFR, Estimated: 17 mL/min — ABNORMAL LOW (ref >60)
Glucose, Bld: 139 mg/dL — ABNORMAL HIGH (ref 70–99)
Potassium: 3.3 mmol/L — ABNORMAL LOW (ref 3.5–5.1)
Sodium: 146 mmol/L — ABNORMAL HIGH (ref 135–145)

## 2022-07-19 LAB — CBC
HCT: 30.4 % — ABNORMAL LOW (ref 39.0–52.0)
Hemoglobin: 9.7 g/dL — ABNORMAL LOW (ref 13.0–17.0)
MCH: 33.3 pg (ref 26.0–34.0)
MCHC: 31.9 g/dL (ref 30.0–36.0)
MCV: 104.5 fL — ABNORMAL HIGH (ref 80.0–100.0)
Platelets: 59 10*3/uL — ABNORMAL LOW (ref 150–400)
RBC: 2.91 MIL/uL — ABNORMAL LOW (ref 4.22–5.81)
RDW: 14.6 % (ref 11.5–15.5)
WBC: 9.6 10*3/uL (ref 4.0–10.5)
nRBC: 0 % (ref 0.0–0.2)

## 2022-07-19 LAB — GLUCOSE, CAPILLARY
Glucose-Capillary: 131 mg/dL — ABNORMAL HIGH (ref 70–99)
Glucose-Capillary: 145 mg/dL — ABNORMAL HIGH (ref 70–99)
Glucose-Capillary: 169 mg/dL — ABNORMAL HIGH (ref 70–99)
Glucose-Capillary: 200 mg/dL — ABNORMAL HIGH (ref 70–99)

## 2022-07-19 LAB — HEPARIN LEVEL (UNFRACTIONATED): Heparin Unfractionated: 0.33 IU/mL (ref 0.30–0.70)

## 2022-07-19 MED ORDER — KCL-LACTATED RINGERS-D5W 20 MEQ/L IV SOLN
INTRAVENOUS | Status: DC
  Filled 2022-07-19 (×4): qty 1000

## 2022-07-19 MED ORDER — POTASSIUM CHLORIDE 2 MEQ/ML IV SOLN
INTRAVENOUS | Status: DC
  Filled 2022-07-19 (×8): qty 1000

## 2022-07-19 MED ORDER — ALBUMIN HUMAN 25 % IV SOLN
25.0000 g | Freq: Four times a day (QID) | INTRAVENOUS | Status: AC
  Administered 2022-07-19 (×2): 25 g via INTRAVENOUS
  Filled 2022-07-19 (×2): qty 100

## 2022-07-19 MED ORDER — POTASSIUM CHLORIDE 20 MEQ PO PACK
40.0000 meq | PACK | Freq: Once | ORAL | Status: AC
  Administered 2022-07-19: 40 meq via ORAL
  Filled 2022-07-19: qty 2

## 2022-07-19 NOTE — TOC Progression Note (Signed)
Transition of Care [facility]) - Progression Note    Patient Details  Name: MIECZYSLAW STAMAS MRN: [id number] Date of Birth: 05-27-1926  Transition of Care [facility]) CM/SW Contact  Elliot Gault, LCSW Phone Number: 07/19/2022, 11:24 AM  Clinical Narrative:     TOC following. Per MD, pt may be stable for dc tomorrow. Spoke with pt's son about dc planning. Son agreeable to SNF rehab referrals. CMS provider options reviewed. Will refer as requested. Insurance Berkley Harvey will be started today as well.  Will follow.  Expected Discharge Plan: Skilled Nursing Facility Barriers to Discharge: Continued Medical Work up  Expected Discharge Plan and Services Expected Discharge Plan: Skilled Nursing Facility In-house Referral: Clinical Social Work   Post Acute Care Choice: Skilled Nursing Facility Living arrangements for the past 2 months: Single Family Home                                       Social Determinants of Health (SDOH) Interventions    Readmission Risk Interventions     No data to display

## 2022-07-19 NOTE — Plan of Care (Signed)

## 2022-07-19 NOTE — Progress Notes (Signed)
Palliative:    Robert Cardenas is lying quietly in bed.  He appears chronically ill and frail.  He is extremely hard of hearing.  He is able to tell me his name, and where we are with prompting.  I believe that he can make his basic needs known.  There is no family present at bedside at this time.  Overall, he has no complaints.  At this point, Robert Cardenas seems to be showing some improvements in his creatinine and mental state.  His condition remains guarded due to poor by mouth intake, continue risk for aspiration, wounds.  At this point, only child, Office manager, states that he would like for his father to try rehab.  Transition of care team is working with family.  Conference with attending, bedside nursing staff, transition of care team, speech therapy related to patient condition, needs, goals of care, disposition.  Face-to-face meeting with transition of care team.  Plan:   At this point continue to treat the treatable but no CPR or intubation, no central lines.  Time for outcomes.  Short-term rehab with possible need for long-term care.  50 minutes  Lillia Carmel, NP Palliative medicine team Team phone [phone] Greater than 50% of this time was spent counseling and coordinating care related to the above assessment and plan.

## 2022-07-19 NOTE — Progress Notes (Signed)
Speech Language Pathology Treatment: Dysphagia  Patient Details Name: Robert Cardenas MRN: [id number] DOB: 1926-10-28 Today's Date: 07/19/2022 Time: 5400-8676 SLP Time Calculation (min) (ACUTE ONLY): 25 min  Assessment / Plan / Recommendation Clinical Impression  Pt seen for ongoing dysphagia intervention. He has reportedly been asking for water and overall lunch intake was minimal. Son arrived towards the end our session. Pt continues to present with global weakness and open mouth breathing. He required mod/max cues to close his lips around the cup and when manipulating bolus orally. Pt with poor oral awareness with thin liquids and resulted in poor labial closure and labial spillage. Pt with improved performance with nectar-thick liquids. He required liquid wash to facilitate oral clearance of purees. Pt is at risk for aspiration due to weakness and poor awareness/attention. Recommend continue D1 and NTL with 1:1 feeder assist and ok to offer ice chips in between meals after oral care for Pt comfort. Above d/w son and Charity fundraiser. SLP will follow and determine appropriateness for MBSS.    HPI HPI: Robert Cardenas is a 88 y.o. male with medical history significant for HTN, Gout, hard of hearing.    Patient was brought to the ED via EMS after patient was found at home by his son unresponsive and with agonal respiration.  At the time of my evaluation, patient appears conscious-with eyes open, intermittently moving his head from side-to-side, moans a little, but not following directions, a flicker of movement of upper extremities to pain.   Per chart review, ED provider talked to patient's son,-Yaziel Draeden, [place]., who is patient's only living direct relative, patient was last seen well, and in his normal state of health about 2 days ago.  He lives by himself, on tea and toast diet.    I talked to patients son on the phone, according to son, patient has declined over the past several months, and was trying to  establish home care for patient, and likely has undiagnosed oral intake.  Patient was found unresponsive by his son, EMS found patient covered in urine, and were initially unsure if patient had a pulse.      SLP Plan  Continue with current plan of care      Recommendations for follow up therapy are one component of a multi-disciplinary discharge planning process, led by the attending physician.  Recommendations may be updated based on patient status, additional functional criteria and insurance authorization.    Recommendations  Diet recommendations: Dysphagia 1 (puree);Nectar-thick liquid Liquids provided via: Cup;Teaspoon Medication Administration: Crushed with puree Supervision: Full supervision/cueing for compensatory strategies Compensations: Minimize environmental distractions;Slow rate;Small sips/bites Postural Changes and/or Swallow Maneuvers: Seated upright 90 degrees;Upright 30-60 min after meal                Oral Care Recommendations: Oral care BID;Oral care prior to ice chip/H20 Follow Up Recommendations: Skilled nursing-short term rehab (<3 hours/day) Assistance recommended at discharge: Frequent or constant Supervision/Assistance SLP Visit Diagnosis: Dysphagia, unspecified (R13.10) Plan: Continue with current plan of care         Thank you,  Havery Moros, CCC-SLP [phone]   Alexx Giambra  07/19/2022, 12:35 PM

## 2022-07-19 NOTE — Plan of Care (Signed)
Has been approved for step down unit on 300, had Palliative consult but son wants SNF. Receiving 1st dose of Albumin. Waiting on bed in Med/Surg. Continue to monitor.

## 2022-07-19 NOTE — Progress Notes (Signed)
Physical Therapy Treatment Patient Details Name: Robert Cardenas MRN: [id number] DOB: 1926-04-24 Today's Date: 07/19/2022   History of Present Illness Robert Cardenas is a 88 y.o. male with medical history significant for HTN, Gout, hard of hearing.    Patient was brought to the ED via EMS after patient was found at home by his son unresponsive and with agonal respiration.  At the time of my evaluation, patient appears conscious-with eyes open, intermittently moving his head from side-to-side, moans a little, but not following directions, a flicker of movement of upper extremities to pain.   Per chart review, ED provider talked to patient's son,-Robert Jru, [place]., who is patient's only living direct relative, patient was last seen well, and in his normal state of health about 2 days ago.  He lives by himself, on tea and toast diet.    I talked to patients son on the phone, according to son, patient has declined over the past several months, and was trying to establish home care for patient, and likely has undiagnosed oral intake.  Patient was found unresponsive by his son, EMS found patient covered in urine, and were initially unsure if patient had a pulse.    PT Comments    Pt would assist with UE motion but wound not assist with rolling or LE motion.    Recommendations for follow up therapy are one component of a multi-disciplinary discharge planning process, led by the attending physician.  Recommendations may be updated based on patient status, additional functional criteria and insurance authorization.  Follow Up Recommendations  Skilled nursing-short term rehab (<3 hours/day)     Assistance Recommended at Discharge Frequent or constant Supervision/Assistance  Patient can return home with the following A lot of help with bathing/dressing/bathroom;A lot of help with walking and/or transfers;Help with stairs or ramp for entrance;Assistance with cooking/housework   Equipment Recommendations   None recommended by PT    Recommendations for Other Services  OT/SP     Precautions / Restrictions Precautions Precautions: Fall     Mobility  Bed Mobility Overal bed mobility: Needs Assistance Bed Mobility: Rolling           General bed mobility comments: Max assist to roll  pt is not initiating any motion                      Cognition Arousal/Alertness: Awake/alert Behavior During Therapy: Flat affect Overall Cognitive Status: No family/caregiver present to determine baseline cognitive functioning                                 General Comments: requires repeated verbal/tactile cueing to participate        Exercises General Exercises - Upper Extremity Shoulder Flexion: 5 reps, Supine, AAROM Elbow Flexion: Both, AAROM, 5 reps Digit Composite Flexion: Both, 5 reps, AAROM General Exercises - Lower Extremity Heel Slides: PROM, 5 reps, Both Hip ABduction/ADduction: PROM, 5 reps, Both    General Comments        Pertinent Vitals/Pain Pain Assessment Faces Pain Scale: Hurts little more    Home Living Family/patient expects to be discharged to:: Private residence Living Arrangements: Alone                 Additional Comments: Patient is a poor historain and unable to provide information    Prior Function            PT Goals (  current goals can now be found in the care plan section)      Frequency    Min 2X/week      PT Plan  Continue to encourage pt to be an active participant in therapy          End of Session   Activity Tolerance: Patient tolerated treatment well;Patient limited by fatigue Patient left: in bed;with call bell/phone within reach Nurse Communication: Mobility status PT Visit Diagnosis: Unsteadiness on feet (R26.81);Other abnormalities of gait and mobility (R26.89);Muscle weakness (generalized) (M62.81)     Time: 8182-9937 PT Time Calculation (min) (ACUTE ONLY): 25 min  Charges:  $Therapeutic  Exercise: 23-37 mins                      Virgina Organ, PT CLT [phone]  07/19/2022, 4:33 PM

## 2022-07-19 NOTE — Progress Notes (Signed)
Progress Note   Patient: Robert Cardenas [id number] DOB: 02/24/1926 DOA: 07/13/2022     6 DOS: the patient was seen and examined on 07/19/2022    Subjective:  The patient was seen and examined this morning, more responsive today able to state his name, stating he is thirsty. Hemodynamically stable     Brief hospital course: negativeRichard R Cardenas is a 88 year old male with a history of CKD, hypertension, prostate cancer status post brachytherapy, gouty arthritis presented when he the patient was found unresponsive at home with agonal respirations.  The patient remained somnolent and obtunded in the emergency department and was unable to provide any significant history.  History is obtained from review of the medical record and speaking with the patient's son.  According to the patient's son, the patient was last seen well approximately 2 days prior to this admission.  Apparently, there has been concern about the patient having cognitive impairment however the patient has been stubborn in terms of receiving medical care.  The patient lives by himself and has poor oral intake.  Apparently, the patient lives on a "tea and toast diet".  On initial arrival in the emergency department, nursing staff was concerned that they were unable to palpate a pulse and initiated CPR with chest compressions for less than 1 minute.  Subsequently, the patient was felt to have a faint femoral pulse with a blood pressure of 56/34.  Vasopressors were initially started.  The patient was given 2 L fluid boluses after which he was able to be weaned off vasopressors initially. The patient had low-grade temperature 100.4 F with tachypnea and hypotension initially.  WBC 20.5, hemoglobin 13.8, platelets 101,000.  Sodium 163, potassium 5.3, bicarbonate 16, BUN 144, serum creatinine 6.07.  Lactic acid peaked at 7.9>> 2.7.  CT brain was negative.  Chest x-ray showed bibasilar atelectasis, right greater than left.  ABG initially  showed 7.3 7/30/175/17 on 2 L.  Troponins were 206>> 231.  EKG showed sinus rhythm and nonspecific T wave changes in the lateral leads.  UA showed 21-50 WBC,>50RBC.  The patient was started on vancomycin, cefepime, metronidazole.  Assessment and Plan:   Septic shock (HCC)/bacteremic with Enterobacter and Proteus  Resolved septic shock, improving sepsis physiology  On admission patient found unresponsive, meeting sepsis criteria with hypothermia- temperature down to 94.3,  leukocytosis of 20, tachycardia heart rate ranging from 25-134 (bradycardia may be secondary to hypothermia), with tachypnea heart rate 21-31.  Hypotensive down to 56/34 improved initially with pressors and now with fluids.  Evidence of endorgan dysfunctionfailure and encephalopathy.   -Source of sepsis likely UTI  -Broad-spectrum antibiotics IV vancomycin cefepime initiated  -Family has elected for DNR/DNI, no central lines, but otherwise with full scope of treatment Discussed and updated his son regarding current finding and treatment plan.  - status posttreatment with IV fluid-2.25 L bolus given, continue maintenance IV fluids -ABG 7.37/30/175/17 on 2L -Blood culture 8/10-> +Enterobacterales, Proteus -Urine culture 8/10->  + Lactobacillus -C. Diff 8/12-> negative -Repeat blood cultures-no growth to date  Vanco 8/10->8/12 Cefepime 8/10->8/12 Ceftriaxone 2gm iv qday 8/12->>>     Acute metabolic encephalopathy -More awake today, able to state his name ...   -Likely worsening in setting of baseline dementia, now acute illness of sepsis/septic shock, hypovolemia and azotemia with BUN of 144.  Head CT without acute abnormality, shows chronic microvascular ischemic changes and cerebral volume loss.  Son reports likely undiagnosed dementia. -EEG 8/12=> nonspecific, suggestive of moderate diffuse encephalopathy. No seizure activity -  Ammonia 21 -Cont iv abx, see above -Status post MBS -speech recommending dysphagia  1(pure) nectar thick liquids   -CBG Q8h  Dysphagia: -Status post modified barium swallow evaluation-speech recommendations Diet recommendations: Dysphagia 1 (puree);Nectar-thick liquid Liquids provided via: Cup;Teaspoon Medication Administration: Crushed with puree Supervision: Full supervision/cueing for compensatory strategies Compensations: Minimize environmental distractions;Slow rate;Small sips/bites  Elevated troponin Troponin elevated at 206.  Likely demand ischemia from severe sepsis and hypovolemia   EKG with T wave changes in lead I, aVL, V5, V6.   Ddx ARF on CKD stage3b - Echo (8/11) EF 60-65%   Hypernatremia -Sodium 152, 147, 145, Improving, 1/2 ns iv -> D51/2 ns iv Check cmp in am   Goals of care, counseling/discussion Son confirms DNR/DNI  Ongoing discussion with patient's POA his son   Pyuria UA with 21-50WBC Urine culture 8/10-> 100,000 Lactobacillus Blood culture 8/10-> + Enterobacter, Proteus MRSA screen negative Cont Abx (Rocephine 2gm iv qday 8/12=>>),    Lactic acidosis Likely due to sepsis, hypoxia-resolved   Acute renal failure superimposed on stage 3b chronic kidney disease (HCC) Baseline creatinine 1.4-1.6 Presented with serum creatinine 6.07 >>> 4.16 >>> 3.59 >> 3.20 BUN: 140, 142, 138, 116, 115, 100 today Due to hemodynamic changes/ATN, volume depletion   Acute respiratory failure with hypoxia (HCC) -Improved currently on room air satting 97% -Been off supplemental oxygen O2 sats down to 72% on room air and tachypnea -likely a degree of hypoventilation and possible aspiration pneumonitis -Obtain ABG 7.37/30/175/17 on 2L -DNR, DNI -Right lower extremity venous Dopplers (8/11)-> no acute DVT, nonocclusive eccentric wall thickening most consistent with chronic DVT in the RLE involving the femoral vein, popliteal vein and proximal peroneal vein -VQ scan 8/12 => intermediate probability of Pulmonary Embolism (PE) Start Heparin iv pharmacy to  dose 8/12... Discontinued 8/16 >>> due to thrombocytopenia platelet dropping to 50, 56 The risks and benefits was discussed with the patient's son and her decision to discontinue heparin Due to patient's multiple coronary metastatic decline severe debility and thrombocytopenia advised against continuing anticoagulation   Hypernatremia Improving w IVF Monitoring with daily labs   Hyperkalemia Resolved Remain on tele   HTN (hypertension) History of hypertension.  -Has been hypotensive, BP stabilizing  Was hypotensive-lood pressure initially 56/34, initially required pressors  -Continue to Levophed for MAP less than 65 -Cont IVF Fluids -Holding Norvasc, Doxazosin   Vitamin B12 Deficiency Start vitamin b12 IM qday   Thrombocytopenia  Monitoring platelets -Platelets 69, 60 >>>50, 56  Discontinue heparin drip   Abnormal liver function Check cmp daily Consider RUQ ultrasound if worsening     Ethics: DNR/DNI Palliative care consulted to further establish goals of care In the face of comorbidities, dementia, prognosis remain poor.    Family Communication: son updated 8/12, 8/13, 8/14, 8/15    Consultants:  none   Code Status:  DNR/DNI    DVT Prophylaxis:  Heparin     Procedures: As Listed in Progress Note Above   Antibiotics: Vanc 8/10>>8/12 Cefepime 8/10>>8/12 Ceftriaxone 8/12->     The patient is critically ill with multiple organ systems failure and requires high complexity decision making for assessment and support, frequent evaluation and titration of therapies, application of advanced monitoring technologies and extensive interpretation of multiple databases.  Critical care time - 55 mins.   .   Physical Exam: Blood pressure (!) 115/54, pulse 69, temperature 97.9 F (36.6 C), resp. rate 18, height 5\' 9"  (1.753 m), weight 77.1 kg, SpO2 96 %.  Physical Exam:   General:  More responsive today, AAO x 1,  cooperative, no distress;    HEENT:  Normocephalic, PERRL, otherwise with in Normal limits   Neuro:  CNII-XII intact. , normal motor and sensation, reflexes intact   Lungs:   Clear to auscultation BL, Respirations unlabored,  No wheezes / crackles  Cardio:    S1/S2, RRR, No murmure, No Rubs or Gallops   Abdomen:  Soft, non-tender, bowel sounds active all four quadrants, no guarding or peritoneal signs.  Muscular  skeletal:  Limited exam -global generalized weaknesses - in bed, able to move all 4 extremities,   2+ pulses,  symmetric, No pitting edema  Skin:  Dry, warm to touch, negative for any Rashes,  Wounds: Please see nursing documentation  Pressure Injury 07/14/22 Buttocks (Active)  07/14/22 0225  Location: Buttocks  Location Orientation:   Staging:   Wound Description (Comments):   Present on Admission:   Dressing Type Foam - Lift dressing to assess site every shift 07/19/22 0738               Data Reviewed:    Latest Ref Rng & Units 07/19/2022    3:48 AM 07/18/2022    3:46 AM 07/17/2022    3:43 AM  CBC  WBC 4.0 - 10.5 K/uL 9.6  10.3  11.9   Hemoglobin 13.0 - 17.0 g/dL 9.7  9.3  56.3   Hematocrit 39.0 - 52.0 % 30.4  30.1  34.7   Platelets 150 - 400 K/uL 59  50  60       Latest Ref Rng & Units 07/19/2022    3:48 AM 07/18/2022    3:46 AM 07/17/2022    3:43 AM  BMP  Glucose 70 - 99 mg/dL 875  643  329   BUN 8 - 23 mg/dL 518  841  660   Creatinine 0.61 - 1.24 mg/dL 6.30  1.60  1.09   Sodium 135 - 145 mmol/L 146  145  147   Potassium 3.5 - 5.1 mmol/L 3.3  3.9  3.3   Chloride 98 - 111 mmol/L 125  121  125   CO2 22 - 32 mmol/L 15  14  15    Calcium 8.9 - 10.3 mg/dL 7.4  7.3  7.3        Author: , MD 07/19/2022 10:54 AM  For on call review [email or web address].

## 2022-07-20 ENCOUNTER — Inpatient Hospital Stay (HOSPITAL_COMMUNITY): Payer: Medicare Other

## 2022-07-20 DIAGNOSIS — R6521 Severe sepsis with septic shock: Secondary | ICD-10-CM | POA: Diagnosis not present

## 2022-07-20 DIAGNOSIS — F03C0 Unspecified dementia, severe, without behavioral disturbance, psychotic disturbance, mood disturbance, and anxiety: Secondary | ICD-10-CM | POA: Diagnosis not present

## 2022-07-20 DIAGNOSIS — I639 Cerebral infarction, unspecified: Secondary | ICD-10-CM | POA: Clinically undetermined

## 2022-07-20 DIAGNOSIS — A419 Sepsis, unspecified organism: Secondary | ICD-10-CM | POA: Diagnosis not present

## 2022-07-20 DIAGNOSIS — Z515 Encounter for palliative care: Secondary | ICD-10-CM | POA: Diagnosis not present

## 2022-07-20 DIAGNOSIS — Z7189 Other specified counseling: Secondary | ICD-10-CM | POA: Diagnosis not present

## 2022-07-20 LAB — BASIC METABOLIC PANEL
Anion gap: 6 (ref 5–15)
BUN: 92 mg/dL — ABNORMAL HIGH (ref 8–23)
CO2: 16 mmol/L — ABNORMAL LOW (ref 22–32)
Calcium: 7.7 mg/dL — ABNORMAL LOW (ref 8.9–10.3)
Chloride: 126 mmol/L — ABNORMAL HIGH (ref 98–111)
Creatinine, Ser: 2.86 mg/dL — ABNORMAL HIGH (ref 0.61–1.24)
GFR, Estimated: 20 mL/min — ABNORMAL LOW (ref >60)
Glucose, Bld: 208 mg/dL — ABNORMAL HIGH (ref 70–99)
Potassium: 3.6 mmol/L (ref 3.5–5.1)
Sodium: 148 mmol/L — ABNORMAL HIGH (ref 135–145)

## 2022-07-20 LAB — CBC
HCT: 29.7 % — ABNORMAL LOW (ref 39.0–52.0)
Hemoglobin: 9.4 g/dL — ABNORMAL LOW (ref 13.0–17.0)
MCH: 33.1 pg (ref 26.0–34.0)
MCHC: 31.6 g/dL (ref 30.0–36.0)
MCV: 104.6 fL — ABNORMAL HIGH (ref 80.0–100.0)
Platelets: 72 10*3/uL — ABNORMAL LOW (ref 150–400)
RBC: 2.84 MIL/uL — ABNORMAL LOW (ref 4.22–5.81)
RDW: 14.6 % (ref 11.5–15.5)
WBC: 9.5 10*3/uL (ref 4.0–10.5)
nRBC: 0 % (ref 0.0–0.2)

## 2022-07-20 LAB — LIPID PANEL
Cholesterol: 88 mg/dL (ref 0–200)
HDL: <10 mg/dL — ABNORMAL LOW (ref >40)
Triglycerides: 180 mg/dL — ABNORMAL HIGH (ref <150)
VLDL: 36 mg/dL (ref 0–40)

## 2022-07-20 LAB — GLUCOSE, CAPILLARY
Glucose-Capillary: 181 mg/dL — ABNORMAL HIGH (ref 70–99)
Glucose-Capillary: 203 mg/dL — ABNORMAL HIGH (ref 70–99)
Glucose-Capillary: 163 mg/dL — ABNORMAL HIGH (ref 70–99)
Glucose-Capillary: 142 mg/dL — ABNORMAL HIGH (ref 70–99)
Glucose-Capillary: 163 mg/dL — ABNORMAL HIGH (ref 70–99)

## 2022-07-20 LAB — HEPARIN LEVEL (UNFRACTIONATED): Heparin Unfractionated: <0.1 IU/mL — ABNORMAL LOW (ref 0.30–0.70)

## 2022-07-20 MED ORDER — HEPARIN SODIUM (PORCINE) 5000 UNIT/ML IJ SOLN
5000.0000 [IU] | Freq: Three times a day (TID) | INTRAMUSCULAR | Status: DC
  Administered 2022-07-20 – 2022-07-21 (×4): 5000 [IU] via SUBCUTANEOUS
  Filled 2022-07-20 (×4): qty 1

## 2022-07-20 MED ORDER — FUROSEMIDE 10 MG/ML IJ SOLN
40.0000 mg | Freq: Once | INTRAMUSCULAR | Status: AC
  Administered 2022-07-20: 40 mg via INTRAVENOUS
  Filled 2022-07-20: qty 4

## 2022-07-20 MED ORDER — POLYETHYLENE GLYCOL 3350 17 G PO PACK
17.0000 g | PACK | Freq: Every day | ORAL | Status: DC
  Administered 2022-07-21: 17 g via ORAL
  Filled 2022-07-20: qty 1

## 2022-07-20 MED ORDER — ALBUMIN HUMAN 25 % IV SOLN
25.0000 g | Freq: Four times a day (QID) | INTRAVENOUS | Status: AC
  Administered 2022-07-20 (×2): 25 g via INTRAVENOUS
  Filled 2022-07-20 (×2): qty 100

## 2022-07-20 MED ORDER — CLOPIDOGREL BISULFATE 75 MG PO TABS
75.0000 mg | ORAL_TABLET | Freq: Every day | ORAL | Status: DC
  Administered 2022-07-20 – 2022-07-21 (×2): 75 mg via ORAL
  Filled 2022-07-20 (×2): qty 1

## 2022-07-20 MED ORDER — ALBUMIN HUMAN 25 % IV SOLN: 25.0000 g | Freq: Once | INTRAVENOUS | Status: DC

## 2022-07-20 MED ORDER — ATORVASTATIN CALCIUM 40 MG PO TABS
40.0000 mg | ORAL_TABLET | Freq: Every day | ORAL | Status: DC
  Administered 2022-07-20 – 2022-07-21 (×2): 40 mg via ORAL
  Filled 2022-07-20 (×2): qty 1

## 2022-07-20 MED ORDER — ASPIRIN 81 MG PO TBEC
81.0000 mg | DELAYED_RELEASE_TABLET | Freq: Every day | ORAL | Status: DC
  Administered 2022-07-20 – 2022-07-21 (×2): 81 mg via ORAL
  Filled 2022-07-20 (×2): qty 1

## 2022-07-20 NOTE — Progress Notes (Signed)
Patient's dressing to his sacrum changed per order. Mepilex's applied to the backside of his upper right and left leg due to areas of redness there. Patient had multiple mepilexes to bilat feet that were dry and intact. Cleaned patient's feet with saline, as there are several small areas to bilat toes that are red, with eschar, or slothing. Patient has been repositioned throughout shift, but ends up back on his left side. Right hand has been monitored for increased swelling as he has IV there, but no increase in swelling noted. Left arm has had weeping , and right upper arm has weeping this shift.

## 2022-07-20 NOTE — Progress Notes (Signed)
Patient able to make eye contact and speak when spoken to. Answers are not always appropriate to question. Has drank his nectar thick liquids today, and requested water. Not much of an appetite. Patient in bunny boots and repositioned with pillows. Attempted new IV for second access, but unsuccessful. Provider Dr. Flossie Dibble notified that patient albumin to be given after antibiotic due to currently infusing IV not being compatible, okay with this due to no other access. Patient very swollen with third spacing. Call bell is within reach, but frequently rounding on patient as well.

## 2022-07-20 NOTE — Care Management Important Message (Signed)
Important Message  Patient Details  Name: Robert Cardenas MRN: [id number] Date of Birth: 29-Dec-1925   Medicare Important Message Given:  Yes     Corey Harold 07/20/2022, 3:45 PM

## 2022-07-20 NOTE — Progress Notes (Signed)
Speech Language Pathology Treatment: Dysphagia  Patient Details Name: Robert Cardenas MRN: [id number] DOB: [birth year] Today's Date: 07/20/2022 Time: 5993-5701 SLP Time Calculation (min) (ACUTE ONLY): 22 min  Assessment / Plan / Recommendation Clinical Impression  Pt seen for ongoing dysphagia intervention. Pt leaning to his left in bed, but able to repositioned more upright with pillows. RN reports that Pt was more alert for breakfast and did not eat very much at lunch (only drank NTL). Pt readily accepted ice chips this session, presented one at a time. He demonstrated improved labial closure to the cup today with thin liquids, but did present with wet vocal quality after sips thin. Improved control again noted with NTL and Pt able to hold the cup in his left hand and allow hand over hand assist for self feeding. Minimal intake of puree as Pt declined all additional PO ("That's all I want."), despite verbal encouragement. Pt continues to fluctuate in alertness and willingness to take PO. Current diet continues to be appropriate, however Pt will need to be followed for upgrades here or at next venue if participation and alertness improves. Recommend D1/puree and NTL with aspiration precautions and feeder assist, oral care assist. SLP will follow.    HPI HPI: Robert Cardenas is a 88 y.o. male with medical history significant for HTN, Gout, hard of hearing.    Patient was brought to the ED via EMS after patient was found at home by his son unresponsive and with agonal respiration.  At the time of my evaluation, patient appears conscious-with eyes open, intermittently moving his head from side-to-side, moans a little, but not following directions, a flicker of movement of upper extremities to pain.   Per chart review, ED provider talked to patient's son,-[name], [name]., who is patient's only living direct relative, patient was last seen well, and in his normal state of health about 2 days ago.  He lives by  himself, on tea and toast diet.    I talked to patients son on the phone, according to son, patient has declined over the past several months, and was trying to establish home care for patient, and likely has undiagnosed oral intake.  Patient was found unresponsive by his son, EMS found patient covered in urine, and were initially unsure if patient had a pulse.      SLP Plan  Continue with current plan of care      Recommendations for follow up therapy are one component of a multi-disciplinary discharge planning process, led by the attending physician.  Recommendations may be updated based on patient status, additional functional criteria and insurance authorization.    Recommendations  Diet recommendations: Dysphagia 1 (puree);Nectar-thick liquid Liquids provided via: Cup;Teaspoon Medication Administration: Crushed with puree Supervision: Full supervision/cueing for compensatory strategies Compensations: Minimize environmental distractions;Slow rate;Small sips/bites Postural Changes and/or Swallow Maneuvers: Seated upright 90 degrees;Upright 30-60 min after meal                Oral Care Recommendations: Oral care BID;Oral care prior to ice chip/H20 Follow Up Recommendations: Skilled nursing-short term rehab (<3 hours/day) Assistance recommended at discharge: Frequent or constant Supervision/Assistance SLP Visit Diagnosis: Dysphagia, unspecified (R13.10) Plan: Continue with current plan of care         Thank you,  Havery Moros, CCC-SLP [phone]   Eveline Sauve  07/20/2022, 4:24 PM

## 2022-07-20 NOTE — Progress Notes (Signed)
Palliative: Robert Cardenas is lying quietly in bed.  He appears acutely/chronically ill and quite frail.  He is resting comfortably, but wakes easily when I touch his shoulder.  He has known dementia, therefore I do not ask orientation questions.  I do not believe that he can make his basic needs known.  There is no family present at bedside at this time.  I offer Robert Cardenas something to drink.  He readily takes a sip of water.  He is noted to have coughing after each sip.  He is noted to have some swelling/third spacing.  He is receiving fluids.  He is having a drop in hemoglobin.  Call to son, Jorryn Casagrande.  We talk about Robert Cardenas acute and chronic health issues.  We talk about Robert Cardenas acute illness and the treatment plan including, but not limited to, third spacing and fluids, kidney function, albumin, low protein stores, by mouth intake.  Robert Cardenas has had dementia for several years.  Braylynn Junior describes a very difficult situation and caring for Robert Cardenas.  We talked about discharge to short-term rehab, and I greatly encouraged Kolbe Junior to [employer] on Child psychotherapist at rehab.  Conference with attending, bedside nursing staff, transition of care team related to patient condition, needs, goals of care, disposition. [place] form/DNR completed and placed on chart.  Plan: At this point continue to treat the treatable but no CPR or intubation, no central lines.  Time for outcomes.  Son is agreeable to short-term rehab, need for long-term care would not be surprising.  50 minutes Lillia Carmel, NP Palliative medicine team Team phone [phone] Greater than 50% of this time was spent counseling and coordinating care related to the above assessment and plan.

## 2022-07-20 NOTE — Assessment & Plan Note (Signed)
-   Continues evaluation with no improvement of mental status -Subsequently followed by MRI/MRA:  IMPRESSION: 1. Acute infarct left occipital cortex. Small acute infarcts in the right frontal white matter. 2. Atrophy and mild-to-moderate chronic microvascular ischemia 3. High-grade stenosis or occlusion of a posterior left M2 branch. Severe stenosis left A2 branch. 4. 3 mm left MCA bifurcation aneurysm  We would like to treat with aspirin+Plavix and statin unfortunately patient's has a progressive dysphagia and thrombocytopenia  We will not proceed with any further studies such as echocardiogram as it would not change the course of management  Prognosis remain poor

## 2022-07-20 NOTE — TOC Progression Note (Signed)
Transition of Care [facility]) - Progression Note    Patient Details  Name: Robert Cardenas MRN: [id number] Date of Birth: January 28, 1926  [facility]) CM/SW Contact  Karn Cassis, [place] Phone Number: 07/20/2022, 11:04 AM  Clinical Narrative:  Per MD, anticipate d/c to SNF tomorrow. Revonda Standard at [facility] updated. MD discussed palliative referral and LCSW followed up with son who is agreeable and requests [facility]. Referral made.      Expected Discharge Plan: Skilled Nursing Facility Barriers to Discharge: Continued Medical Work up  Expected Discharge Plan and Services Expected Discharge Plan: Skilled Nursing Facility In-house Referral: Clinical Social Work   Post Acute Care Choice: Skilled Nursing Facility Living arrangements for the past 2 months: Single Family Home                                       Social Determinants of Health (SDOH) Interventions    Readmission Risk Interventions     No data to display

## 2022-07-20 NOTE — Progress Notes (Addendum)
Progress Note   Patient: Robert Cardenas [id number] DOB: 30-Mar-1926 DOA: 07/13/2022     7 DOS: the patient was seen and examined on 07/20/2022    Subjective:  The patient was seen and examined today much more awake alert, responding to verbal command and oriented to himself. Noted for upper extremity edema, IV lines were lost when line has been reestablished     Brief hospital course: negativeRichard R Cardenas is a 88 year old male with a history of CKD, hypertension, prostate cancer status post brachytherapy, gouty arthritis presented when he the patient was found unresponsive at home with agonal respirations.  The patient remained somnolent and obtunded in the emergency department and was unable to provide any significant history.  History is obtained from review of the medical record and speaking with the patient's son.  According to the patient's son, the patient was last seen well approximately 2 days prior to this admission.  Apparently, there has been concern about the patient having cognitive impairment however the patient has been stubborn in terms of receiving medical care.  The patient lives by himself and has poor oral intake.  Apparently, the patient lives on a "tea and toast diet".  On initial arrival in the emergency department, nursing staff was concerned that they were unable to palpate a pulse and initiated CPR with chest compressions for less than 1 minute.  Subsequently, the patient was felt to have a faint femoral pulse with a blood pressure of 56/34.  Vasopressors were initially started.  The patient was given 2 L fluid boluses after which he was able to be weaned off vasopressors initially. The patient had low-grade temperature 100.4 F with tachypnea and hypotension initially.  WBC 20.5, hemoglobin 13.8, platelets 101,000.  Sodium 163, potassium 5.3, bicarbonate 16, BUN 144, serum creatinine 6.07.  Lactic acid peaked at 7.9>> 2.7.  CT brain was negative.  Chest x-ray showed  bibasilar atelectasis, right greater than left.  ABG initially showed 7.3 7/30/175/17 on 2 L.  Troponins were 206>> 231.  EKG showed sinus rhythm and nonspecific T wave changes in the lateral leads.  UA showed 21-50 WBC,>50RBC.  The patient was started on vancomycin, cefepime, metronidazole.  Assessment and Plan:   Septic shock (HCC)/bacteremic with Enterobacter and Proteus  Resolved -Improved sepsis physiology  On admission patient found unresponsive, meeting sepsis criteria with hypothermia- temperature down to 94.3,  leukocytosis of 20, tachycardia heart rate ranging from 25-134 (bradycardia may be secondary to hypothermia), with tachypnea heart rate 21-31.  Hypotensive down to 56/34 improved initially with pressors and now with fluids.  Evidence of endorgan dysfunctionfailure and encephalopathy.   -Source of sepsis likely UTI  -Broad-spectrum antibiotics IV vancomycin cefepime initiated  -Family has elected for DNR/DNI, no central lines, but otherwise with full scope of treatment Discussed and updated his son regarding current finding and treatment plan.  - status posttreatment with IV fluid-2.25 L bolus given, continue maintenance IV fluids -ABG 7.37/30/175/17 on 2L -Blood culture 8/10-> +Enterobacterales, Proteus -Urine culture 8/10->  + Lactobacillus -C. Diff 8/12-> negative -Repeat blood cultures - No growth to date  Vanco 8/10->8/12 Cefepime 8/10->8/12 Ceftriaxone 2gm iv qday 8/12->>>    Acute stroke left occipital cortex/right frontal   - Continues evaluation with no improvement of mental status -Subsequently followed by MRI/MRA: IMPRESSION: 1. Acute infarct left occipital cortex. Small acute infarcts in the right frontal white matter. 2. Atrophy and mild-to-moderate chronic microvascular ischemia 3. High-grade stenosis or occlusion of a posterior left M2 branch. Severe  stenosis left A2 branch. 4. 3 mm left MCA bifurcation aneurysm  We would like to treat with Aspirin  + Plavix and Statin unfortunately patient's has a progressive dysphagia and thrombocytopenia -we will attempt to start the medication-and monitor  We will not proceed with any further studies such as echocardiogram as it would not change the course of management  Prognosis remain poor  Acute metabolic encephalopathy -More awake today, able to state his name ...  -Obtaining MRI today 8/17/2023for further evaluation   -Likely worsening in setting of baseline dementia, now acute illness of sepsis/septic shock, hypovolemia and azotemia with BUN of 144.  Head CT without acute abnormality, shows chronic microvascular ischemic changes and cerebral volume loss.  Son reports likely undiagnosed dementia. -EEG 8/12=> nonspecific, suggestive of moderate diffuse encephalopathy. No seizure activity -Ammonia 21 -Cont iv abx, see above -Status post MBS -speech recommending dysphagia 1(pure) nectar thick liquids   -CBG Q8h  Dysphagia: -Status post modified barium swallow evaluation-speech recommendations Diet recommendations: Dysphagia 1 (puree);Nectar-thick liquid Liquids provided via: Cup;Teaspoon Medication Administration: Crushed with puree Supervision: Full supervision/cueing for compensatory strategies Compensations: Minimize environmental distractions;Slow rate;Small sips/bites  Elevated troponin Troponin elevated at 206.  Likely demand ischemia from severe sepsis and hypovolemia   EKG with T wave changes in lead I, aVL, V5, V6.   Ddx ARF on CKD stage3b - Echo (8/11) EF 60-65%   Hypernatremia -Sodium 152, 147, 145, 148 Improving, 1/2 ns iv -> D51/2 ns iv Check cmp in am   Goals of care, counseling/discussion Son confirms DNR/DNI  Ongoing discussion with patient's POA his son   Pyuria UA with 21-50WBC Urine culture 8/10-> 100,000 Lactobacillus Blood culture 8/10-> + Enterobacter, Proteus MRSA screen negative Cont Abx (Rocephine 2gm iv qday 8/12=>>),    Lactic acidosis Likely due  to sepsis, hypoxia-resolved   Acute renal failure superimposed on stage 3b chronic kidney disease (HCC) Baseline creatinine 1.4-1.6 Presented with serum creatinine 6.07 >>> 4.16 >>> 3.59 >> 3.20 >> 2.86 BUN: 140, 142, 138, 116, 115, 100 today Due to hemodynamic changes/ATN, volume depletion   Acute respiratory failure with hypoxia (HCC) -Improved currently on room air satting 97% -Been off supplemental oxygen O2 sats down to 72% on room air and tachypnea -likely a degree of hypoventilation and possible aspiration pneumonitis -Obtain ABG 7.37/30/175/17 on 2L -DNR, DNI -Right lower extremity venous Dopplers (8/11)-> no acute DVT, nonocclusive eccentric wall thickening most consistent with chronic DVT in the RLE involving the femoral vein, popliteal vein and proximal peroneal vein -VQ scan 8/12 => intermediate probability of Pulmonary Embolism (PE) Start Heparin iv pharmacy to dose 8/12... Discontinued 8/16 >>> due to thrombocytopenia platelet dropping to 50, 56 The risks and benefits was discussed with the patient's son and her decision to discontinue heparin Due to patient's multiple coronary metastatic decline severe debility and thrombocytopenia advised against continuing anticoagulation   Hypernatremia Improving w IVF Monitoring with daily labs   Hyperkalemia Resolved Remain on tele   HTN (hypertension) History of hypertension.  -Has been hypotensive, BP stabilizing  Was hypotensive-lood pressure initially 56/34, initially required pressors  -Continue to Levophed for MAP less than 65 -Cont IVF Fluids... Significantly reduced due to edema -Holding Norvasc, Doxazosin   Vitamin B12 Deficiency Start vitamin b12 IM qday   Thrombocytopenia  Monitoring platelets -Platelets 69, 60 >>>50, 56  Discontinue heparin drip   Abnormal liver function Check cmp daily Consider RUQ ultrasound if worsening   Hypoalbuminemia: Repleting IV  Bilateral upper and lower  extremity  edema: Patient is third spacing due to hypoalbuminemia, albumin be repleted, with IV Lasix... Patient continues to need fluids   Ethics: DNR/DNI Palliative care consulted to further establish goals of care In the face of comorbidities, dementia, prognosis remain poor.    Family Communication: son updated 8/12, 8/13, 8/14, 8/15    Consultants:  none   Code Status:  DNR/DNI    DVT Prophylaxis:  Heparin     Procedures: As Listed in Progress Note Above   Antibiotics: Vanc 8/10>>8/12 Cefepime 8/10>>8/12 Ceftriaxone 8/12->      Objective:  Blood pressure (!) 149/72, pulse 77, temperature 98.7 F (37.1 C), resp. rate (!) 21, height 5\' 9"  (1.753 m), weight 77.1 kg, SpO2 97 %.   Physical Exam:   General:  More awake and responsive today AAO x 1,  cooperative, no distress;  Leans to one side,  HEENT:  Normocephalic, PERRL, otherwise with in Normal limits   Neuro:  CNII-XII intact. , normal motor and sensation, reflexes intact   Lungs:   Clear to auscultation BL, Respirations unlabored,  No wheezes / crackles  Cardio:    S1/S2, RRR, No murmure, No Rubs or Gallops   Abdomen:  Soft, non-tender, bowel sounds active all four quadrants, no guarding or peritoneal signs.  Muscular  skeletal:  Limited exam - sever global generalized weaknesses -Difficult to assess asymmetrical weaknesses and sensation as patient is not very responsive--remain in bed with severe debility 2+ pulses,  symmetric, No pitting edema  Skin:  Dry, warm to touch, negative for any Rashes,  Wounds: Please see nursing documentation  Pressure Injury 07/14/22 Buttocks (Active)  07/14/22 0225  Location: Buttocks  Location Orientation:   Staging:   Wound Description (Comments):   Present on Admission:   Dressing Type Foam - Lift dressing to assess site every shift 07/19/22 1156               Data Reviewed:    Latest Ref Rng & Units 07/20/2022    6:38 AM 07/19/2022    3:48 AM 07/18/2022     3:46 AM  CBC  WBC 4.0 - 10.5 K/uL 9.5  9.6  10.3   Hemoglobin 13.0 - 17.0 g/dL 9.4  9.7  9.3   Hematocrit 39.0 - 52.0 % 29.7  30.4  30.1   Platelets 150 - 400 K/uL 72  59  50       Latest Ref Rng & Units 07/20/2022    6:38 AM 07/19/2022    3:48 AM 07/18/2022    3:46 AM  BMP  Glucose 70 - 99 mg/dL 208  139  233   BUN 8 - 23 mg/dL 92  100  115   Creatinine 0.61 - 1.24 mg/dL 2.86  3.20  3.59   Sodium 135 - 145 mmol/L 148  146  145   Potassium 3.5 - 5.1 mmol/L 3.6  3.3  3.9   Chloride 98 - 111 mmol/L 126  125  121   CO2 22 - 32 mmol/L 16  15  14    Calcium 8.9 - 10.3 mg/dL 7.7  7.4  7.3        Author: Deatra James, MD 07/20/2022 4:06 PM  For on call review [email or web address].    The patient is critically ill with multiple organ systems failure and requires high complexity decision making for assessment and support, frequent evaluation and titration of therapies, application of advanced monitoring technologies and extensive interpretation of multiple databases.  Critical care time - 71  mins.

## 2022-07-20 NOTE — Progress Notes (Signed)
Patient has rested well this shift. Patient vitals have been stable. Wound care done. Patient repositioned during shift.

## 2022-07-21 DIAGNOSIS — A419 Sepsis, unspecified organism: Secondary | ICD-10-CM | POA: Diagnosis not present

## 2022-07-21 DIAGNOSIS — R6521 Severe sepsis with septic shock: Secondary | ICD-10-CM | POA: Diagnosis not present

## 2022-07-21 LAB — BASIC METABOLIC PANEL
Anion gap: 9 (ref 5–15)
BUN: 82 mg/dL — ABNORMAL HIGH (ref 8–23)
CO2: 16 mmol/L — ABNORMAL LOW (ref 22–32)
Calcium: 7.9 mg/dL — ABNORMAL LOW (ref 8.9–10.3)
Chloride: 124 mmol/L — ABNORMAL HIGH (ref 98–111)
Creatinine, Ser: 2.75 mg/dL — ABNORMAL HIGH (ref 0.61–1.24)
GFR, Estimated: 21 mL/min — ABNORMAL LOW (ref >60)
Glucose, Bld: 170 mg/dL — ABNORMAL HIGH (ref 70–99)
Potassium: 3.3 mmol/L — ABNORMAL LOW (ref 3.5–5.1)
Sodium: 149 mmol/L — ABNORMAL HIGH (ref 135–145)

## 2022-07-21 LAB — GLUCOSE, CAPILLARY
Glucose-Capillary: 155 mg/dL — ABNORMAL HIGH (ref 70–99)
Glucose-Capillary: 148 mg/dL — ABNORMAL HIGH (ref 70–99)
Glucose-Capillary: 144 mg/dL — ABNORMAL HIGH (ref 70–99)

## 2022-07-21 LAB — CBC
HCT: 30.4 % — ABNORMAL LOW (ref 39.0–52.0)
Hemoglobin: 9.9 g/dL — ABNORMAL LOW (ref 13.0–17.0)
MCH: 33.1 pg (ref 26.0–34.0)
MCHC: 32.6 g/dL (ref 30.0–36.0)
MCV: 101.7 fL — ABNORMAL HIGH (ref 80.0–100.0)
Platelets: 85 10*3/uL — ABNORMAL LOW (ref 150–400)
RBC: 2.99 MIL/uL — ABNORMAL LOW (ref 4.22–5.81)
RDW: 14.6 % (ref 11.5–15.5)
WBC: 10.7 10*3/uL — ABNORMAL HIGH (ref 4.0–10.5)
nRBC: 0 % (ref 0.0–0.2)

## 2022-07-21 MED ORDER — CALCIUM GLUCONATE-NACL 1-0.675 GM/50ML-% IV SOLN
1.0000 g | Freq: Once | INTRAVENOUS | Status: AC
  Administered 2022-07-21: 1000 mg via INTRAVENOUS
  Filled 2022-07-21: qty 50

## 2022-07-21 MED ORDER — POLYETHYLENE GLYCOL 3350 17 G PO PACK: 17.0000 g | PACK | Freq: Every day | ORAL | 0 refills | Status: AC

## 2022-07-21 MED ORDER — ATORVASTATIN CALCIUM 40 MG PO TABS: 40.0000 mg | ORAL_TABLET | Freq: Every day | ORAL | 0 refills | Status: AC

## 2022-07-21 MED ORDER — FAMOTIDINE 20 MG PO TABS
20.0000 mg | ORAL_TABLET | Freq: Every day | ORAL | Status: DC
  Administered 2022-07-21: 20 mg via ORAL
  Filled 2022-07-21: qty 1

## 2022-07-21 MED ORDER — POTASSIUM CHLORIDE CRYS ER 20 MEQ PO TBCR
20.0000 meq | EXTENDED_RELEASE_TABLET | Freq: Once | ORAL | Status: AC
  Administered 2022-07-21: 20 meq via ORAL
  Filled 2022-07-21: qty 1

## 2022-07-21 MED ORDER — FAMOTIDINE 20 MG PO TABS: 20.0000 mg | ORAL_TABLET | Freq: Every day | ORAL | 0 refills | Status: AC

## 2022-07-21 MED ORDER — CLOPIDOGREL BISULFATE 75 MG PO TABS: 75.0000 mg | ORAL_TABLET | Freq: Every day | ORAL | 0 refills | Status: AC

## 2022-07-21 NOTE — Progress Notes (Signed)
Speech Language Pathology Treatment: Dysphagia  Patient Details Name: Robert Cardenas MRN: [id number] DOB: 16-Mar-1926 Today's Date: 07/21/2022 Time: 3220-2542 SLP Time Calculation (min) (ACUTE ONLY): 32 min  Assessment / Plan / Recommendation Clinical Impression  Ongoing diagnostic dysphagia treatment provided this am. Provided oral care followed by ice chips - Pt was requesting water. Pt initially demonstrated no overt s/sx of aspiration with ice chips/tsp sips of water, however after 2-3 sips note wet vocal quality and decreased coordination of swallow. No wet vocal quality was noted with NTL trials. Also note Pt continues to require MAX cues to consume more that 1-2 bites or sips of any presentation. Agree that D1/puree and NTL continues to be appropriate diet secondary to fluctuating mentation/alertness. Pt continues to require 1:1 feeder assist and will benefit from ST f/u at d/c venue. ST will continue to follow acutely.     HPI HPI: Robert Cardenas is a 88 y.o. male with medical history significant for HTN, Gout, hard of hearing.    Patient was brought to the ED via EMS after patient was found at home by his son unresponsive and with agonal respiration.  At the time of my evaluation, patient appears conscious-with eyes open, intermittently moving his head from side-to-side, moans a little, but not following directions, a flicker of movement of upper extremities to pain.   Per chart review, ED provider talked to patient's son,-Robert Cardenas, Enrique., who is patient's only living direct relative, patient was last seen well, and in his normal state of health about 2 days ago.  He lives by himself, on tea and toast diet.    I talked to patients son on the phone, according to son, patient has declined over the past several months, and was trying to establish home care for patient, and likely has undiagnosed oral intake.  Patient was found unresponsive by his son, EMS found patient covered in urine, and were  initially unsure if patient had a pulse.      SLP Plan  Continue with current plan of care      Recommendations for follow up therapy are one component of a multi-disciplinary discharge planning process, led by the attending physician.  Recommendations may be updated based on patient status, additional functional criteria and insurance authorization.    Recommendations  Diet recommendations: Dysphagia 1 (puree);Nectar-thick liquid Liquids provided via: Cup;Teaspoon Medication Administration: Crushed with puree Supervision: Full supervision/cueing for compensatory strategies Compensations: Minimize environmental distractions;Slow rate;Small sips/bites Postural Changes and/or Swallow Maneuvers: Seated upright 90 degrees;Upright 30-60 min after meal                Oral Care Recommendations: Oral care BID;Oral care prior to ice chip/H20 Follow Up Recommendations: Skilled nursing-short term rehab (<3 hours/day) Assistance recommended at discharge: Frequent or constant Supervision/Assistance SLP Visit Diagnosis: Dysphagia, unspecified (R13.10) Plan: Continue with current plan of care         Sheyna Pettibone H. Romie Levee, CCC-SLP Speech Language Pathologist   [name]  07/21/2022, 7:04 AM

## 2022-07-21 NOTE — TOC Transition Note (Signed)
Transition of Care [facility]) - CM/SW Discharge Note   Patient Details  Name: Robert Cardenas MRN: [id number] Date of Birth: Oct 01, 1926  [facility] CM/SW Contact:  Villa Herb, LCSWA Phone Number: 07/21/2022, 12:21 PM   Clinical Narrative:    CSW updated pt is medically ready for D/C to [facility]. CSW spoke to [place] who states they can accept pt today. CSW updated RN of numbers for room and report. CSW completed med necessity and sent it to the floor. CSW to call for EMS when Rn is ready. CSW updated pts son of plan for D/C to facility. TOC signing off.   Final next level of care: Skilled Nursing Facility Barriers to Discharge: Barriers Resolved   Patient Goals and CMS Choice Patient states their goals for this hospitalization and ongoing recovery are:: go to SNF CMS Medicare.gov Compare Post Acute Care list provided to:: Patient Choice offered to / list presented to : Patient  Discharge Placement              Patient chooses bed at: [facility] Patient to be transferred to facility by: EMS Name of family member notified: Mckinley Jewel. Patient and family notified of of transfer: 07/21/22  Discharge Plan and Services In-house Referral: Clinical Social Work   Post Acute Care Choice: Skilled Nursing Facility                               Social Determinants of Health (SDOH) Interventions     Readmission Risk Interventions     No data to display

## 2022-07-21 NOTE — Discharge Summary (Signed)
Physician Discharge Summary   Patient: Robert Cardenas MRN: [id number] DOB: 29-Oct-1926  Admit date:     07/13/2022  Discharge date: 07/21/22  Discharge Physician: Kendell Bane   PCP: Carylon Perches, MD   Recommendations at discharge:   Follow-up with palliative care-hospice soon as possible Aspiration precaution Follow dysphagia-diet  Discharge Diagnoses: Principal Problem:   Septic shock (HCC) Active Problems:   Elevated troponin   Acute metabolic encephalopathy   CVA (cerebral vascular accident) (HCC)   Hypernatremia   HTN (hypertension)   Hyperkalemia   Acute respiratory failure with hypoxia (HCC)   Acute renal failure superimposed on stage 3b chronic kidney disease (HCC)   Lactic acidosis   Pyuria   Goals of care, counseling/discussion  Resolved Problems:   * No resolved hospital problems. [facility] Course: negativeRichard R Cardenas is a 88 year old male with a history of CKD, hypertension, prostate cancer status post brachytherapy, gouty arthritis presented when he the patient was found unresponsive at home with agonal respirations.  The patient remained somnolent and obtunded in the emergency department and was unable to provide any significant history.  History is obtained from review of the medical record and speaking with the patient's son.  According to the patient's son, the patient was last seen well approximately 2 days prior to this admission.  Apparently, there has been concern about the patient having cognitive impairment however the patient has been stubborn in terms of receiving medical care.  The patient lives by himself and has poor oral intake.  Apparently, the patient lives on a "tea and toast diet".  On initial arrival in the emergency department, nursing staff was concerned that they were unable to palpate a pulse and initiated CPR with chest compressions for less than 1 minute.  Subsequently, the patient was felt to have a faint femoral pulse with a blood  pressure of 56/34.  Vasopressors were initially started.  The patient was given 2 L fluid boluses after which he was able to be weaned off vasopressors initially. The patient had low-grade temperature 100.4 F with tachypnea and hypotension initially.  WBC 20.5, hemoglobin 13.8, platelets 101,000.  Sodium 163, potassium 5.3, bicarbonate 16, BUN 144, serum creatinine 6.07.  Lactic acid peaked at 7.9>> 2.7.  CT brain was negative.  Chest x-ray showed bibasilar atelectasis, right greater than left.  ABG initially showed 7.3 7/30/175/17 on 2 L.  Troponins were 206>> 231.  EKG showed sinus rhythm and nonspecific T wave changes in the lateral leads.  UA showed 21-50 WBC,>50RBC.  The patient was started on vancomycin, cefepime, metronidazole.  Assessment and Plan: * Septic shock (HCC) -Resolved sepsis physiology  On admission: patient found unresponsive, meeting sepsis criteria with hypothermia- temperature down to 94.3,  leukocytosis of 20, tachycardia heart rate ranging from 25-134 (bradycardia may be secondary to hypothermia), with tachypnea heart rate 21-31.  Hypotensive down to 56/34 improved initially with pressors Evidence of endorgan dysfunctionfailure and encephalopathy.   -Source of sepsis likely UTI vs cellulitis leg -Broad-spectrum antibiotics IV vancomycin cefepime  -Family has elected for DNR/DNI, no central lines, but otherwise with full scope of treatment -2.25 L bolus given, continue 1/2 N/s 125cc/hr x 1 day --Initial blood cultures Proteus pansensitive -Subsequent blood cultures negative to date -Urine culture greater than 100 K lactobacillus  - status posttreatment with IV fluid-2.25 L bolus given, continue maintenance IV fluids -ABG 7.37/30/175/17 on 2L -Blood culture 8/10-> +Enterobacterales, Proteus -Urine culture 8/10->  + Lactobacillus -C. Diff 8/12-> negative -Repeat  blood cultures - No growth to date   Vanco 8/10->8/12 Cefepime 8/10->8/12 Ceftriaxone 2gm iv qday 8/12->>>  07/21/2022  -Sepsis physiology resolved - completed antibiotic course  CVA (cerebral vascular accident) ([place]) - Continues evaluation with no improvement of mental status -Subsequently followed by MRI/MRA:   IMPRESSION: 1. Acute infarct left occipital cortex. Small acute infarcts in the right frontal white matter. 2. Atrophy and mild-to-moderate chronic microvascular ischemia 3. High-grade stenosis or occlusion of a posterior left M2 branch. Severe stenosis left A2 branch. 4. 3 mm left MCA bifurcation aneurysm  We would like to treat with aspirin+Plavix and statin unfortunately patient's has a progressive dysphagia and thrombocytopenia  We will not proceed with any further studies such as echocardiogram as it would not change the course of management  Prognosis remain poor  Acute metabolic encephalopathy -Improved,--- but not to baseline Hard of hearing alert oriented x1  Encephalopathy was exacerbated  secondary to septic shock, hypovolemia, acute CVA and azotemia with BUN of 144.  Head CT without acute abnormality, shows chronic microvascular ischemic changes and cerebral volume loss.  Son reports likely undiagnosed dementia. -Status post IV antibiotics and IV fluids  -Status post speech evaluation on dysphagia diet  Dysphagia: -Status post modified barium swallow evaluation-speech recommendations Diet recommendations: Dysphagia 1 (puree);Nectar-thick liquid Liquids provided via: Cup;Teaspoon Medication Administration: Crushed with puree Supervision: Full supervision/cueing for compensatory strategies Compensations: Minimize environmental distractions;Slow rate;Small sips/bites   Lactic acidosis Likely due to sepsis, hypoxia-resolved   Acute renal failure superimposed on stage 3b chronic kidney disease (HCC) Baseline creatinine 1.4-1.6 Presented with serum creatinine 6.07 >>> 4.16 >>> 3.59 >> 3.20 >> 2.86 >>  BUN: 140, 142, 138, 116, 115, 100, 97 Due to hemodynamic  changes/ATN, volume depletion  Elevated troponin Troponin elevated at 206.  Likely demand ischemia from severe sepsis and hypovolemia.  EKG with T wave changes in lead I, aVL, V5, V6. - Trend troponin - Echo: Reviewed  Hypernatremia Improved with IV fluids  Goals of care, counseling/discussion Son confirms DNR/DNI  Pyuria UA with 21-50WBC -Urine culture greater than 100 K lactobacillus species Continue empiric antibiotics-completed course of antibiotics  Lactic acidosis Due to sepsis and hypoxia -Resolved status post IV fluid resuscitation  Acute renal failure superimposed on stage 3b chronic kidney disease ([place]) Baseline creatinine 1.4-1.6 Presented with serum creatinine 6.07 Due to hemodynamic changes/ATN, volume depletion Lab Results  Component Value Date   CREATININE 2.75 (H) 07/21/2022   CREATININE 2.86 (H) 07/20/2022   CREATININE 3.20 (H) 07/19/2022     Acute respiratory failure with hypoxia (HCC) - Resolved -Satting 95% on room air O2 sats down to 72% on room air and tachypnea -likely a degree of hypoventilation and possible aspiration pneumonitis -Obtain ABG 7.37/30/175/17 on 2L -stable on 2L -DNR, DNI   Hyperkalemia Improved  HTN (hypertension) History of hypertension.  Blood pressure initially 56/34, initially required pressors  -Continue Levophed for MAP less than 65 -Status post IVF Fluids -Blood pressure has improved, now hypertensive, resuming home medication of  Norvasc, doxazosin  Vitamin B12 Deficiency Start vitamin b12 1072micrograms IM qday   Thrombocytopenia  Monitoring platelets -Platelets 69, 60 >>>50, 56, 85 Discontinue heparin drip   Abnormal liver function Check cmp daily Consider RUQ ultrasound if worsening   Hypoalbuminemia: Repleting IV   Bilateral upper and lower extremity edema: Patient is third spacing due to hypoalbuminemia, albumin be repleted, with IV Lasix... Patient continues to need fluids   Ethics:  DNR/DNI Palliative care consulted to further establish goals of  care In the face of comorbidities, dementia, prognosis remain poor.       Consultants: Palliative care team Procedures performed: MRI of the head, pressors Disposition: Skilled nursing facility Diet recommendation:  Discharge Diet Orders (From admission, onward)     Start     Ordered   07/21/22 0000  Diet - low sodium heart healthy        07/21/22 1117           Dysphagia type 1 thickened  Liquid DISCHARGE MEDICATION: Allergies as of 07/21/2022   No Known Allergies      Medication List     STOP taking these medications    allopurinol 100 MG tablet Commonly known as: ZYLOPRIM   doxazosin 8 MG tablet Commonly known as: CARDURA       TAKE these medications    acetaminophen 325 MG tablet Commonly known as: TYLENOL Take 2 tablets (650 mg total) by mouth every 6 (six) hours as needed for mild pain (or Fever >/= 101).   amLODipine 5 MG tablet Commonly known as: NORVASC Take 5 mg by mouth daily.   ascorbic acid 500 MG tablet Commonly known as: VITAMIN C Take 500 mg by mouth daily.   aspirin EC 81 MG tablet Take 81 mg by mouth daily.   atorvastatin 40 MG tablet Commonly known as: LIPITOR Take 1 tablet (40 mg total) by mouth daily. Start taking on: July 22, 2022   clopidogrel 75 MG tablet Commonly known as: PLAVIX Take 1 tablet (75 mg total) by mouth daily. Start taking on: July 22, 2022   famotidine 20 MG tablet Commonly known as: PEPCID Take 1 tablet (20 mg total) by mouth daily. Start taking on: July 22, 2022   polyethylene glycol 17 g packet Commonly known as: MIRALAX / GLYCOLAX Take 17 g by mouth daily. Start taking on: July 22, 2022               Discharge Care Instructions  (From admission, onward)           Start     Ordered   07/21/22 0000  Discharge wound care:       Comments: Continue bilateral Botox deep tissue pressure injury right greater than  left-wash with soap-water rinse and pat gently dry.  Apply a one 8 inch layer of collagen- Santyl .  Top with saline moistened gauze.  Cover with dry gauze and secure with silicone foam bordered dressing.  Turn patient side-to-side, minimize time and supine position. Turn patient every 2 hours.   07/21/22 1117            Contact information for after-discharge care     [facility] .   Service: Skilled Nursing Contact information: [address] [place] [phone]                    Discharge Exam: Danley Danker Weights   07/15/22 0600 07/18/22 0330 07/19/22 0530  Weight: 70.1 kg 76.5 kg 77.1 kg      Physical Exam:   General:  AAO x 1, deviating to the right -hard of hearing  HEENT:  Normocephalic, PERRL, otherwise with in Normal limits   Neuro:  CNII-XII intact. , normal motor and sensation, reflexes intact   Lungs:   Clear to auscultation BL, Respirations unlabored,  No wheezes / crackles  Cardio:    S1/S2, RRR, No murmure, No Rubs or Gallops  Abdomen:  Soft, non-tender, bowel sounds active all four quadrants, no guarding or peritoneal signs.  Muscular  skeletal:  Limited exam - sever global generalized weaknesses - in bed, able to move all 4 extremities,   2+ pulses,  symmetric, No pitting edema  Skin:  Dry, warm to touch, negative for any Rashes,  Wounds: Please see nursing documentation  Pressure Injury 07/14/22 Buttocks (Active)  07/14/22 0225  Location: Buttocks  Location Orientation:   Staging:   Wound Description (Comments):   Present on Admission:   Dressing Type Foam - Lift dressing to assess site every shift 07/19/22 1156          Condition at discharge: stable  The results of significant diagnostics from this hospitalization (including imaging, microbiology, ancillary and laboratory) are listed below for reference.   Imaging Studies: MR BRAIN WO  CONTRAST  Result Date: 07/20/2022 CLINICAL DATA:  Altered mental status EXAM: MRI HEAD WITHOUT CONTRAST MRA HEAD WITHOUT CONTRAST TECHNIQUE: Multiplanar, multi-echo pulse sequences of the brain and surrounding structures were acquired without intravenous contrast. Angiographic images of the Circle of Willis were acquired using MRA technique without intravenous contrast. COMPARISON:  CT head 07/13/2022 FINDINGS: MRI HEAD FINDINGS Brain: Small area of acute cortical infarct left lateral occipital lobe. Punctate acute infarct in the right frontal white matter Moderate atrophy. Prominent atrophy of the hippocampus bilaterally. Mild to moderate chronic microvascular ischemic change in the white matter. Negative for hemorrhage, mass, hydrocephalus. Vascular: Normal arterial flow voids Skull and upper cervical spine: No focal skeletal lesion. Sinuses/Orbits: Mild mucosal edema paranasal sinuses. Bilateral cataract extraction. Mild left mastoid effusion. Other: None MRA HEAD FINDINGS Anterior circulation: Internal carotid artery widely patent through the cavernous segment bilaterally. Right middle cerebral artery and right anterior cerebral artery widely patent Severe focal stenosis left A2 segment with distal flow. High-grade stenosis or occlusion of proximal M2 segment. 3 mm aneurysm left MCA bifurcation. Posterior circulation: Both vertebral arteries patent to the basilar. Basilar widely patent. Posterior cerebral arteries patent bilaterally without significant stenosis. Anatomic variants: None IMPRESSION: 1. Acute infarct left occipital cortex. Small acute infarcts in the right frontal white matter. 2. Atrophy and mild-to-moderate chronic microvascular ischemia 3. High-grade stenosis or occlusion of a posterior left M2 branch. Severe stenosis left A2 branch. 4. 3 mm left MCA bifurcation aneurysm Electronically Signed   By: Franchot Gallo M.D.   On: 07/20/2022 15:44   MR ANGIO HEAD WO CONTRAST  Result Date:  07/20/2022 CLINICAL DATA:  Altered mental status EXAM: MRI HEAD WITHOUT CONTRAST MRA HEAD WITHOUT CONTRAST TECHNIQUE: Multiplanar, multi-echo pulse sequences of the brain and surrounding structures were acquired without intravenous contrast. Angiographic images of the Circle of Willis were acquired using MRA technique without intravenous contrast. COMPARISON:  CT head 07/13/2022 FINDINGS: MRI HEAD FINDINGS Brain: Small area of acute cortical infarct left lateral occipital lobe. Punctate acute infarct in the right frontal white matter Moderate atrophy. Prominent atrophy of the hippocampus bilaterally. Mild to moderate chronic microvascular ischemic change in the white matter. Negative for hemorrhage, mass, hydrocephalus. Vascular: Normal arterial flow voids Skull and upper cervical spine: No focal skeletal lesion. Sinuses/Orbits: Mild mucosal edema paranasal sinuses. Bilateral cataract extraction. Mild left mastoid effusion. Other: None MRA HEAD FINDINGS Anterior circulation: Internal carotid artery widely patent through the cavernous segment bilaterally. Right middle cerebral artery and right anterior cerebral artery widely patent Severe focal stenosis left A2 segment with distal flow. High-grade stenosis or occlusion of proximal M2 segment. 3 mm aneurysm left MCA bifurcation.  Posterior circulation: Both vertebral arteries patent to the basilar. Basilar widely patent. Posterior cerebral arteries patent bilaterally without significant stenosis. Anatomic variants: None IMPRESSION: 1. Acute infarct left occipital cortex. Small acute infarcts in the right frontal white matter. 2. Atrophy and mild-to-moderate chronic microvascular ischemia 3. High-grade stenosis or occlusion of a posterior left M2 branch. Severe stenosis left A2 branch. 4. 3 mm left MCA bifurcation aneurysm Electronically Signed   By: Franchot Gallo M.D.   On: 07/20/2022 15:44   [place] EKG SITE RITE  Result Date: 07/16/2022 If Site Rite image not  attached, placement could not be confirmed due to current cardiac rhythm.  NM Pulmonary Perfusion  Result Date: 07/15/2022 CLINICAL DATA:  Pulmonary embolism suspected. EXAM: NUCLEAR MEDICINE PERFUSION LUNG SCAN TECHNIQUE: Perfusion images were obtained in multiple projections after intravenous injection of radiopharmaceutical. Ventilation scans intentionally deferred if perfusion scan and chest x-ray adequate for interpretation during COVID 19 epidemic. RADIOPHARMACEUTICALS:  4.4 mCi Tc-64m MAA IV COMPARISON:  Chest x-ray July 15, 2002 FINDINGS: The study is limited as there is no ventilation imaging. No segmental defects are identified. No abnormalities on the right. Decreased perfusion in the lower lobe on the left. This correlates with opacity on the chest x-ray from earlier today. IMPRESSION: The study is limited due the lack of ventilation imaging. However, there is a perfusion defect in the left base which matches an opacity on today's chest x-ray. The findings are most consistent with an intermediate probability for pulmonary embolus. No other defects or segmental defects identified. These results will be called to the ordering clinician or representative by the Radiologist Assistant, and communication documented in the PACS or [employer]. Electronically Signed   By: Dorise Bullion III M.D.   On: 07/15/2022 13:04   DG Chest Port 1 View  Result Date: 07/15/2022 CLINICAL DATA:  Pulmonary embolism suspected.  Hypoxia. EXAM: PORTABLE CHEST 1 VIEW COMPARISON:  July 13, 2022 FINDINGS: No pneumothorax. Increased opacity in the left base obscuring left hemidiaphragm. The cardiomediastinal silhouette is stable. No other acute abnormalities are identified. IMPRESSION: New opacity in left base may represent atelectasis. Infiltrate not excluded. Recommend attention on follow-up. Electronically Signed   By: Dorise Bullion III M.D.   On: 07/15/2022 09:44   EEG adult  Result Date: 07/15/2022 Lora Havens, MD     07/15/2022  5:55 AM Patient Name: GERAMIAH RENNAKER MRN: [id number] Epilepsy Attending: Lora Havens Referring Physician/Provider: Orson Eva, MD Date: 07/14/2022 Duration: 23.12 mins Patient history: 88yo M with ams. EEG to evaluate for seizure. Level of alertness: Awake AEDs during EEG study: None Technical aspects: This EEG study was done with scalp electrodes positioned according to the 10-20 International system of electrode placement. Electrical activity was reviewed with band pass filter of 1-70Hz , sensitivity of 7 uV/mm, display speed of 21mm/sec with a 60Hz  notched filter applied as appropriate. EEG data were recorded continuously and digitally stored.  Video monitoring was available and reviewed as appropriate. Description: No posterior dominant rhythm was seen. EEG showed continuous generalized 3 to 6 Hz theta-delta slowing. Hyperventilation and photic stimulation were not performed.   EEG was technically difficult due to significant myogenic artifact. ABNORMALITY - Continuous slow, generalized IMPRESSION: This technically difficult study is suggestive of moderate diffuse encephalopathy, nonspecific etiology. No seizures or epileptiform discharges were seen throughout the recording. Priyanka Barbra Sarks   US Venous Img Lower Bilateral (DVT)  Result Date: 07/14/2022 CLINICAL DATA:  Right greater than left lower extremity pain and  right lower extremity edema EXAM: BILATERAL LOWER EXTREMITY VENOUS DOPPLER ULTRASOUND TECHNIQUE: Gray-scale sonography with graded compression, as well as color Doppler and duplex ultrasound were performed to evaluate the lower extremity deep venous systems from the level of the common femoral vein and including the common femoral, femoral, profunda femoral, popliteal and calf veins including the posterior tibial, peroneal and gastrocnemius veins when visible. The superficial great saphenous vein was also interrogated. Spectral Doppler was utilized to evaluate  flow at rest and with distal augmentation maneuvers in the common femoral, femoral and popliteal veins. COMPARISON:  None Available. FINDINGS: RIGHT LOWER EXTREMITY Common Femoral Vein: No evidence of thrombus. Normal compressibility, respiratory phasicity and response to augmentation. Saphenofemoral Junction: No evidence of thrombus. Normal compressibility and flow on color Doppler imaging. Profunda Femoral Vein: No evidence of thrombus. Normal compressibility and flow on color Doppler imaging. Femoral Vein: Partially compressible. Eccentric wall thickening present throughout the femoral vein. Color flow can be visualized on color Doppler imaging. Popliteal Vein: Similar changes present in the popliteal vein. Calf Veins: Nonocclusive thrombus extends into the peroneal veins. The posterior tibial vein is patent and compressible. Superficial Great Saphenous Vein: No evidence of thrombus. Normal compressibility. Venous Reflux:  None. Other Findings:  None. LEFT LOWER EXTREMITY Common Femoral Vein: No evidence of thrombus. Normal compressibility, respiratory phasicity and response to augmentation. Saphenofemoral Junction: No evidence of thrombus. Normal compressibility and flow on color Doppler imaging. Profunda Femoral Vein: No evidence of thrombus. Normal compressibility and flow on color Doppler imaging. Femoral Vein: No evidence of thrombus. Normal compressibility, respiratory phasicity and response to augmentation. Popliteal Vein: No evidence of thrombus. Normal compressibility, respiratory phasicity and response to augmentation. Calf Veins: No evidence of thrombus. Normal compressibility and flow on color Doppler imaging. Superficial Great Saphenous Vein: No evidence of thrombus. Normal compressibility. Venous Reflux:  None. Other Findings:  None. IMPRESSION: 1. Nonocclusive eccentric wall thickening most consistent with chronic DVT in the right lower extremity involving the femoral vein throughout the thigh, the  popliteal vein and the proximal peroneal veins. 2. No definite evidence of acute DVT. 3. No evidence of DVT within the left lower extremity. Electronically Signed   By: Jacqulynn Cadet M.D.   On: 07/14/2022 13:22   ECHOCARDIOGRAM COMPLETE  Result Date: 07/14/2022    ECHOCARDIOGRAM REPORT   Patient Name:   CURLEE ROZZI Date of Exam: 07/14/2022 Medical Rec #:  [id number]       Height:       69.0 in Accession #:    [id number]      Weight:       144.4 lb Date of Birth:  11-27-1926        BSA:          1.799 m Patient Age:    32 years        BP:           109/67 mmHg Patient Gender: M               HR:           85 bpm. Exam Location:  [facility] Procedure: 2D Echo, Cardiac Doppler and Color Doppler Indications:    Elevated Troponin  History:        Patient has prior history of Echocardiogram examinations, most                 recent 05/14/2018. Abnormal ECG, Signs/Symptoms:Chest Pain and  Altered Mental Status; Risk Factors:Hypertension.  Sonographer:    Mikki Harbor Referring Phys: [phone] Heloise [address] [facility]  Sonographer Comments: Technically challenging study due to limited acoustic windows. Image acquisition challenging due to patient behavioral factors. IMPRESSIONS  1. Left ventricular ejection fraction, by estimation, is 60 to 65%. The left ventricle has normal function. The left ventricle has no regional wall motion abnormalities. There is moderate left ventricular hypertrophy. Left ventricular diastolic parameters are consistent with Grade I diastolic dysfunction (impaired relaxation).  2. RV not well visualized, grossly appears normal in size and function. . Right ventricular systolic function was not well visualized. The right ventricular size is not well visualized. Tricuspid regurgitation signal is inadequate for assessing PA pressure.  3. The mitral valve is normal in structure. No evidence of mitral valve regurgitation. No evidence of mitral stenosis.  4. The aortic valve is  tricuspid. There is moderate calcification of the aortic valve. There is moderate thickening of the aortic valve. Aortic valve regurgitation is not visualized. No aortic stenosis is present.  5. IVC is small suggesting low RA presssure and hypovolemia. FINDINGS  Left Ventricle: Left ventricular ejection fraction, by estimation, is 60 to 65%. The left ventricle has normal function. The left ventricle has no regional wall motion abnormalities. The left ventricular internal cavity size was normal in size. There is  moderate left ventricular hypertrophy. Left ventricular diastolic parameters are consistent with Grade I diastolic dysfunction (impaired relaxation). Right Ventricle: RV not well visualized, grossly appears normal in size and function. The right ventricular size is not well visualized. Right vetricular wall thickness was not well visualized. Right ventricular systolic function was not well visualized.  Tricuspid regurgitation signal is inadequate for assessing PA pressure. Left Atrium: Left atrial size was not well visualized. Right Atrium: Right atrial size was not well visualized. Pericardium: There is no evidence of pericardial effusion. Mitral Valve: The mitral valve is normal in structure. No evidence of mitral valve regurgitation. No evidence of mitral valve stenosis. MV peak gradient, 1.3 mmHg. The mean mitral valve gradient is 1.0 mmHg. Tricuspid Valve: The tricuspid valve is not well visualized. Tricuspid valve regurgitation is not demonstrated. No evidence of tricuspid stenosis. Aortic Valve: Patient combative during study, LVOT and AV Dopplers are off axis and inaccurate. The aortic valve is tricuspid. There is moderate calcification of the aortic valve. There is moderate thickening of the aortic valve. There is moderate aortic  valve annular calcification. Aortic valve regurgitation is not visualized. No aortic stenosis is present. Aortic valve mean gradient measures 2.0 mmHg. Aortic valve peak  gradient measures 3.3 mmHg. Aortic valve area, by VTI measures 2.51 cm. Pulmonic Valve: The pulmonic valve was not well visualized. Pulmonic valve regurgitation is not visualized. No evidence of pulmonic stenosis. Aorta: The aortic root is normal in size and structure. Venous: IVC is small suggesting low RA presssure and hypovolemia. IAS/Shunts: No atrial level shunt detected by color flow Doppler.  LEFT VENTRICLE PLAX 2D LVIDd:         3.90 cm LVIDs:         2.60 cm LV PW:         1.40 cm LV IVS:        1.40 cm LVOT diam:     1.90 cm LV SV:         31 LV SV Index:   17 LVOT Area:     2.84 cm  LEFT ATRIUM         Index LA diam:  2.40 cm 1.33 cm/m  AORTIC VALVE                    PULMONIC VALVE AV Area (Vmax):    2.29 cm     PV Vmax:       0.71 m/s AV Area (Vmean):   2.44 cm     PV Peak grad:  2.0 mmHg AV Area (VTI):     2.51 cm AV Vmax:           91.40 cm/s AV Vmean:          54.900 cm/s AV VTI:            0.123 m AV Peak Grad:      3.3 mmHg AV Mean Grad:      2.0 mmHg LVOT Vmax:         73.80 cm/s LVOT Vmean:        47.300 cm/s LVOT VTI:          0.109 m LVOT/AV VTI ratio: 0.89  AORTA Ao Root diam: 3.30 cm MITRAL VALVE MV Area (PHT): 3.67 cm   SHUNTS MV Area VTI:   2.18 cm   Systemic VTI:  0.11 m MV Peak grad:  1.3 mmHg   Systemic Diam: 1.90 cm MV Mean grad:  1.0 mmHg MV Vmax:       0.56 m/s MV Vmean:      39.4 cm/s Carlyle Dolly MD Electronically signed by Carlyle Dolly MD Signature Date/Time: 07/14/2022/11:53:34 AM    Final    CT Head Wo Contrast  Result Date: 07/13/2022 CLINICAL DATA:  Altered mental status EXAM: CT HEAD WITHOUT CONTRAST TECHNIQUE: Contiguous axial images were obtained from the base of the skull through the vertex without intravenous contrast. RADIATION DOSE REDUCTION: This exam was performed according to the departmental dose-optimization program which includes automated exposure control, adjustment of the mA and/or kV according to patient size and/or use of iterative  reconstruction technique. COMPARISON:  None Available. FINDINGS: Brain: No evidence of acute infarction, hemorrhage, hydrocephalus, extra-axial collection or mass lesion/mass effect. Patchy low-density changes within the periventricular and subcortical white matter compatible with chronic microvascular ischemic change. Mild diffuse cerebral volume loss. Vascular: Atherosclerotic calcifications involving the large vessels of the skull base. No unexpected hyperdense vessel. Skull: Normal. Negative for fracture or focal lesion. Sinuses/Orbits: No acute finding. Other: None. IMPRESSION: 1. No acute intracranial abnormality. 2. Chronic microvascular ischemic change and cerebral volume loss. Electronically Signed   By: Davina Poke D.O.   On: 07/13/2022 16:13   DG Chest Port 1 View  Result Date: 07/13/2022 CLINICAL DATA:  Concern for sepsis, altered mental status EXAM: PORTABLE CHEST 1 VIEW COMPARISON:  05/10/2018, 02/13/2016 FINDINGS: Unchanged cardiac and mediastinal contours. Aortic atherosclerosis. Left retrocardiac and right basilar heterogeneous opacities. No pleural effusion or pneumothorax. No acute osseous abnormality. Degenerative changes in the right-greater-than-left shoulder. IMPRESSION: Left retrocardiac and right basilar heterogeneous opacities, which are nonspecific and may represent atelectasis, infection, or inflammatory process. Electronically Signed   By: Merilyn Baba M.D.   On: 07/13/2022 15:15    Microbiology: Results for orders placed or performed during the hospital encounter of 07/13/22  MRSA Next Gen by PCR, Nasal     Status: None   Collection Time: 07/13/22 10:52 AM   Specimen: Nasal Mucosa; Nasal Swab  Result Value Ref Range Status   MRSA by PCR Next Gen NOT DETECTED NOT DETECTED Final    Comment: (NOTE) The GeneXpert MRSA Assay (FDA approved  for NASAL specimens only), is one component of a comprehensive MRSA colonization surveillance program. It is not intended to diagnose  MRSA infection nor to guide or monitor treatment for MRSA infections. Test performance is not FDA approved in patients less than 72 years old. Performed at [facility], [address], [place], [place] [address]   Resp Panel by RT-PCR (Flu A&B, Covid) Anterior Nasal Swab     Status: None   Collection Time: 07/13/22  4:39 PM   Specimen: Anterior Nasal Swab  Result Value Ref Range Status   SARS Coronavirus 2 by RT PCR NEGATIVE NEGATIVE Final    Comment: (NOTE) SARS-CoV-2 target nucleic acids are NOT DETECTED.  The SARS-CoV-2 RNA is generally detectable in upper respiratory specimens during the acute phase of infection. The lowest concentration of SARS-CoV-2 viral copies this assay can detect is 138 copies/mL. A negative result does not preclude SARS-Cov-2 infection and should not be used as the sole basis for treatment or other patient management decisions. A negative result may occur with  improper specimen collection/handling, submission of specimen other than nasopharyngeal swab, presence of viral mutation(s) within the areas targeted by this assay, and inadequate number of viral copies(<138 copies/mL). A negative result must be combined with clinical observations, patient history, and epidemiological information. The expected result is Negative.  Fact Sheet for Patients:  EntrepreneurPulse.com.au  Fact Sheet for Healthcare Providers:  IncredibleEmployment.be  This test is no t yet approved or cleared by the [place] FDA and  has been authorized for detection and/or diagnosis of SARS-CoV-2 by FDA under an Emergency Use Authorization (EUA). This EUA will remain  in effect (meaning this test can be used) for the duration of the COVID-19 declaration under Section 564(b)(1) of the Act, 21 U.S.C.section 360bbb-3(b)(1), unless the authorization is terminated  or revoked sooner.       Influenza A by PCR NEGATIVE NEGATIVE Final   Influenza B  by PCR NEGATIVE NEGATIVE Final    Comment: (NOTE) The Xpert Xpress SARS-CoV-2/FLU/RSV plus assay is intended as an aid in the diagnosis of influenza from Nasopharyngeal swab specimens and should not be used as a sole basis for treatment. Nasal washings and aspirates are unacceptable for Xpert Xpress SARS-CoV-2/FLU/RSV testing.  Fact Sheet for Patients: EntrepreneurPulse.com.au  Fact Sheet for Healthcare Providers: IncredibleEmployment.be  This test is not yet approved or cleared by the [place] FDA and has been authorized for detection and/or diagnosis of SARS-CoV-2 by FDA under an Emergency Use Authorization (EUA). This EUA will remain in effect (meaning this test can be used) for the duration of the COVID-19 declaration under Section 564(b)(1) of the Act, 21 U.S.C. section 360bbb-3(b)(1), unless the authorization is terminated or revoked.  Performed at [facility], [address] [place], [place] [address]   Blood Culture (routine x 2)     Status: Abnormal   Collection Time: 07/13/22  4:39 PM   Specimen: BLOOD  Result Value Ref Range Status   Specimen Description   Final    BLOOD LEFT ANTECUBITAL Performed at [facility], [address] [place], [place] [address]    Special Requests   Final    BOTTLES DRAWN AEROBIC AND ANAEROBIC Blood Culture adequate volume Performed at [place] [place], [place] [address]    Culture  Setup Time   Final    GRAM NEGATIVE RODS Gram Stain Report Called to,Read Back By and Verified With: MILLS M. AT 1114AM ON SE:[id number] BY THOMPSON S. IN BOTH AEROBIC  AND ANAEROBIC BOTTLES CRITICAL RESULT CALLED TO, READ BACK BY AND VERIFIED WITH: RN ANNE CARLTON ON 07/14/22 @ 1849 BY DRT Performed at [facility], [place] [address] [place], [place] [address]    Culture PROTEUS MIRABILIS (A)  Final   Report Status 07/16/2022 FINAL  Final   Organism ID, Bacteria PROTEUS MIRABILIS  Final       Susceptibility   Proteus mirabilis - MIC*    AMPICILLIN <=2 SENSITIVE Sensitive     CEFAZOLIN <=4 SENSITIVE Sensitive     CEFEPIME <=0.12 SENSITIVE Sensitive     CEFTAZIDIME <=1 SENSITIVE Sensitive     CEFTRIAXONE <=0.25 SENSITIVE Sensitive     CIPROFLOXACIN <=0.25 SENSITIVE Sensitive     GENTAMICIN <=1 SENSITIVE Sensitive     IMIPENEM 2 SENSITIVE Sensitive     TRIMETH/SULFA <=20 SENSITIVE Sensitive     AMPICILLIN/SULBACTAM <=2 SENSITIVE Sensitive     PIP/TAZO <=4 SENSITIVE Sensitive     * PROTEUS MIRABILIS  Urine Culture     Status: Abnormal   Collection Time: 07/13/22  4:39 PM   Specimen: In/Out Cath Urine  Result Value Ref Range Status   Specimen Description   Final    IN/OUT CATH URINE Performed at [facility], [address], [place], [place] [address]    Special Requests   Final    NONE Performed at [facility], [address], [place],  [address]    Culture (A)  Final    >=100,000 COLONIES/mL LACTOBACILLUS SPECIES Standardized susceptibility testing for this organism is not available. Performed at [facility], [place] [address], [place],  [address]    Report Status 07/15/2022 FINAL  Final  Blood Culture ID Panel (Reflexed)     Status: Abnormal   Collection Time: 07/13/22  4:39 PM  Result Value Ref Range Status   Enterococcus faecalis NOT DETECTED NOT DETECTED Final   Enterococcus Faecium NOT DETECTED NOT DETECTED Final   Listeria monocytogenes NOT DETECTED NOT DETECTED Final   Staphylococcus species NOT DETECTED NOT DETECTED Final   Staphylococcus aureus (BCID) NOT DETECTED NOT DETECTED Final   Staphylococcus epidermidis NOT DETECTED NOT DETECTED Final   Staphylococcus lugdunensis NOT DETECTED NOT DETECTED Final   Streptococcus species NOT DETECTED NOT DETECTED Final   Streptococcus agalactiae NOT DETECTED NOT DETECTED Final   Streptococcus pneumoniae NOT DETECTED NOT DETECTED Final   Streptococcus pyogenes NOT DETECTED NOT DETECTED Final    A.calcoaceticus-baumannii NOT DETECTED NOT DETECTED Final   Bacteroides fragilis NOT DETECTED NOT DETECTED Final   Enterobacterales DETECTED (A) NOT DETECTED Final    Comment: Enterobacterales represent a large order of gram negative bacteria, not a single organism. CRITICAL RESULT CALLED TO, READ BACK BY AND VERIFIED WITH: RN ANNE CARLTON ON 07/14/22 @ 1849 BY DRT    Enterobacter cloacae complex NOT DETECTED NOT DETECTED Final   Escherichia coli NOT DETECTED NOT DETECTED Final   Klebsiella aerogenes NOT DETECTED NOT DETECTED Final   Klebsiella oxytoca NOT DETECTED NOT DETECTED Final   Klebsiella pneumoniae NOT DETECTED NOT DETECTED Final   Proteus species DETECTED (A) NOT DETECTED Final    Comment: CRITICAL RESULT CALLED TO, READ BACK BY AND VERIFIED WITH: RN ANNE CARLTON ON 07/14/22 @ 1849 BY DRT    Salmonella species NOT DETECTED NOT DETECTED Final   Serratia marcescens NOT DETECTED NOT DETECTED Final   Haemophilus influenzae NOT DETECTED NOT DETECTED Final   Neisseria meningitidis NOT DETECTED NOT DETECTED Final   Pseudomonas aeruginosa NOT DETECTED NOT  DETECTED Final   Stenotrophomonas maltophilia NOT DETECTED NOT DETECTED Final   Candida albicans NOT DETECTED NOT DETECTED Final   Candida auris NOT DETECTED NOT DETECTED Final   Candida glabrata NOT DETECTED NOT DETECTED Final   Candida krusei NOT DETECTED NOT DETECTED Final   Candida parapsilosis NOT DETECTED NOT DETECTED Final   Candida tropicalis NOT DETECTED NOT DETECTED Final   Cryptococcus neoformans/gattii NOT DETECTED NOT DETECTED Final   CTX-M ESBL NOT DETECTED NOT DETECTED Final   Carbapenem resistance IMP NOT DETECTED NOT DETECTED Final   Carbapenem resistance KPC NOT DETECTED NOT DETECTED Final   Carbapenem resistance NDM NOT DETECTED NOT DETECTED Final   Carbapenem resist OXA 48 LIKE NOT DETECTED NOT DETECTED Final   Carbapenem resistance VIM NOT DETECTED NOT DETECTED Final    Comment: Performed at [facility], [address], [place], [place] [address]  Blood Culture (routine x 2)     Status: None   Collection Time: 07/13/22  5:51 PM   Specimen: BLOOD LEFT HAND  Result Value Ref Range Status   Specimen Description BLOOD LEFT HAND  Final   Special Requests   Final    BOTTLES DRAWN AEROBIC ONLY Blood Culture results may not be optimal due to an inadequate volume of blood received in culture bottles   Culture   Final    NO GROWTH 5 DAYS Performed at [facility], [address] [place], [place] [address]    Report Status 07/18/2022 FINAL  Final  C Difficile Quick Screen w PCR reflex     Status: None   Collection Time: 07/15/22  9:07 AM   Specimen: STOOL  Result Value Ref Range Status   C Diff antigen NEGATIVE NEGATIVE Final   C Diff toxin NEGATIVE NEGATIVE Final   C Diff interpretation No C. difficile detected.  Final    Comment: Performed at [facility], [address] [place],  [address]    Labs: CBC: Recent Labs  Lab 07/15/22 0340 07/16/22 0325 07/17/22 0343 07/18/22 0346 07/19/22 0348 07/20/22 0638 07/21/22 0450  WBC 19.0* 16.2* 11.9* 10.3 9.6 9.5 10.7*  NEUTROABS 17.2* 15.1*  --   --   --   --   --   HGB 13.6 12.3* 11.3* 9.3* 9.7* 9.4* 9.9*  HCT 42.8 38.4* 34.7* 30.1* 30.4* 29.7* 30.4*  MCV 107.5* 104.9* 104.2* 107.1* 104.5* 104.6* 101.7*  PLT 64* 69* 60* 50* 59* 72* 85*   Basic Metabolic Panel: Recent Labs  Lab 07/15/22 0340 07/16/22 0325 07/17/22 0343 07/18/22 0346 07/19/22 0348 07/20/22 0638 07/21/22 0450  NA 156*   < > 147* 145 146* 148* 149*  K 4.2   < > 3.3* 3.9 3.3* 3.6 3.3*  CL 129*   < > 125* 121* 125* 126* 124*  CO2 14*   < > 15* 14* 15* 16* 16*  GLUCOSE 172*   < > 166* 233* 139* 208* 170*  BUN 142*   < > 116* 115* 100* 92* 82*  CREATININE 5.49*   < > 4.16* 3.59* 3.20* 2.86* 2.75*  CALCIUM 7.4*   < > 7.3* 7.3* 7.4* 7.7* 7.9*  MG 2.4  --   --   --   --   --   --    < > = values in this interval not displayed.   Liver Function  Tests: Recent Labs  Lab 07/15/22 0340 07/16/22 0325 07/17/22 0343  AST 68* 71* 41  ALT 25 31 25   ALKPHOS 55  59 50  BILITOT 1.3* 0.9 0.7  PROT 5.0* 5.1* 4.5*  ALBUMIN 2.2* 2.1* 1.8*   CBG: Recent Labs  Lab 07/20/22 0743 07/20/22 1111 07/20/22 1605 07/20/22 2055 07/21/22 0733  GLUCAP 203* 163* 142* 163* 155*    Discharge time spent: greater than 45 minutes.  Signed: Deatra James, MD [facility] 07/21/2022

## 2022-07-21 NOTE — Progress Notes (Signed)
   07/21/22 1241  Legal Guardian  Does Patient Have a Court Appointed Legal Guardian? Yes  Legal Guardian Other:  Legal Guardian Contact Information Robert Cardenas  Copy of Legal Guardianship Form in Chart No - copy requested  Legal Guardian Notified of Pending Discharge  Successfully notified

## 2022-07-21 NOTE — Progress Notes (Signed)
Physical Therapy Treatment Patient Details Name: Robert Cardenas MRN: [id number] DOB: 06-21-1926 Today's Date: 07/21/2022   History of Present Illness Robert Cardenas is a 88 y.o. male with medical history significant for HTN, Gout, hard of hearing.    Patient was brought to the ED via EMS after patient was found at home by his son unresponsive and with agonal respiration.  At the time of my evaluation, patient appears conscious-with eyes open, intermittently moving his head from side-to-side, moans a little, but not following directions, a flicker of movement of upper extremities to pain.   Per chart review, ED provider talked to patient's son,-Rithvik Abdulkarim, [place] who is patient's only living direct relative, patient was last seen well, and in his normal state of health about 2 days ago.  He lives by himself, on tea and toast diet.    I talked to patients son on the phone, according to son, patient has declined over the past several months, and was trying to establish home care for patient, and likely has undiagnosed oral intake.  Patient was found unresponsive by his son, EMS found patient covered in urine, and were initially unsure if patient had a pulse.    PT Comments    Patient demonstrates slow labored movement for sitting up at bedside with frequent falling backwards and to the right due to weakness and poor trunk control.  Patient tolerated sitting up at bedside and occasionally able to use LUE to help balance self and move bed sheets, unable to voluntarily move extremities against gravity, but did demonstrate purposeful movement of LUE.  Patient unable to attempt sit to stands or transferring to chair due to generalized weakness and fall risk.  Patient required Max/total assist to reposition when put back to bed.  Patient will benefit from continued skilled physical therapy in hospital and recommended venue below to increase strength, balance, endurance for safe ADLs and gait.      Recommendations for follow up therapy are one component of a multi-disciplinary discharge planning process, led by the attending physician.  Recommendations may be updated based on patient status, additional functional criteria and insurance authorization.  Follow Up Recommendations  Skilled nursing-short term rehab (<3 hours/day) Can patient physically be transported by private vehicle: No   Assistance Recommended at Discharge Frequent or constant Supervision/Assistance  Patient can return home with the following A lot of help with bathing/dressing/bathroom;A lot of help with walking and/or transfers;Help with stairs or ramp for entrance;Assistance with cooking/housework   Equipment Recommendations  None recommended by PT    Recommendations for Other Services       Precautions / Restrictions Precautions Precautions: Fall Restrictions Weight Bearing Restrictions: No     Mobility  Bed Mobility Overal bed mobility: Needs Assistance Bed Mobility: Supine to Sit, Sit to Supine, Rolling Rolling: Max assist   Supine to sit: Max assist, Total assist Sit to supine: Max assist, Total assist   General bed mobility comments: slow labored movement with limited use of BUE/LE due to weakness    Transfers                        Ambulation/Gait                   Stairs             Wheelchair Mobility    Modified Rankin (Stroke Patients Only)       Balance Overall balance assessment: Needs assistance Sitting-balance  support: Feet supported, Bilateral upper extremity supported Sitting balance-Leahy Scale: Poor Sitting balance - Comments: seated at EOB Postural control: Posterior lean, Right lateral lean                                  Cognition Arousal/Alertness: Awake/alert Behavior During Therapy: Flat affect Overall Cognitive Status: No family/caregiver present to determine baseline cognitive functioning                                           Exercises      General Comments        Pertinent Vitals/Pain Pain Assessment Pain Assessment: No/denies pain    Home Living                          Prior Function            PT Goals (current goals can now be found in the care plan section) Acute Rehab PT Goals Patient Stated Goal: not stated PT Goal Formulation: With patient Time For Goal Achievement: 07/31/22 Potential to Achieve Goals: Fair Progress towards PT goals: Progressing toward goals    Frequency    Min 2X/week      PT Plan Current plan remains appropriate    Co-evaluation              AM-PAC PT "6 Clicks" Mobility   Outcome Measure  Help needed turning from your back to your side while in a flat bed without using bedrails?: A Lot Help needed moving from lying on your back to sitting on the side of a flat bed without using bedrails?: A Lot Help needed moving to and from a bed to a chair (including a wheelchair)?: Total Help needed standing up from a chair using your arms (e.g., wheelchair or bedside chair)?: Total Help needed to walk in hospital room?: Total Help needed climbing 3-5 steps with a railing? : Total 6 Click Score: 8    End of Session   Activity Tolerance: Patient tolerated treatment well;Patient limited by fatigue Patient left: in bed;with call bell/phone within reach Nurse Communication: Mobility status PT Visit Diagnosis: Unsteadiness on feet (R26.81);Other abnormalities of gait and mobility (R26.89);Muscle weakness (generalized) (M62.81)     Time: 1010-1031 PT Time Calculation (min) (ACUTE ONLY): 21 min  Charges:  $Therapeutic Activity: 8-22 mins                     11:44 AM, 07/21/22 Ocie Bob, MPT Physical Therapist with [facility] [phone] office [phone] mobile phone

## 2022-07-21 NOTE — Progress Notes (Signed)
Nsg Discharge Note  Admit Date:  07/13/2022 Discharge date: 07/21/2022   Robert Cardenas to be D/C'd Home per MD order.  AVS completed.  Patient/caregiver able to verbalize understanding.  Discharge Medication: Allergies as of 07/21/2022   No Known Allergies      Medication List     STOP taking these medications    allopurinol 100 MG tablet Commonly known as: ZYLOPRIM   doxazosin 8 MG tablet Commonly known as: CARDURA       TAKE these medications    acetaminophen 325 MG tablet Commonly known as: TYLENOL Take 2 tablets (650 mg total) by mouth every 6 (six) hours as needed for mild pain (or Fever >/= 101).   amLODipine 5 MG tablet Commonly known as: NORVASC Take 5 mg by mouth daily.   ascorbic acid 500 MG tablet Commonly known as: VITAMIN C Take 500 mg by mouth daily.   aspirin EC 81 MG tablet Take 81 mg by mouth daily.   atorvastatin 40 MG tablet Commonly known as: LIPITOR Take 1 tablet (40 mg total) by mouth daily. Start taking on: July 22, 2022   clopidogrel 75 MG tablet Commonly known as: PLAVIX Take 1 tablet (75 mg total) by mouth daily. Start taking on: July 22, 2022   famotidine 20 MG tablet Commonly known as: PEPCID Take 1 tablet (20 mg total) by mouth daily. Start taking on: July 22, 2022   polyethylene glycol 17 g packet Commonly known as: MIRALAX / GLYCOLAX Take 17 g by mouth daily. Start taking on: July 22, 2022               Discharge Care Instructions  (From admission, onward)           Start     Ordered   07/21/22 0000  Discharge wound care:       Comments: Continue bilateral Botox deep tissue pressure injury right greater than left-wash with soap-water rinse and pat gently dry.  Apply a one 8 inch layer of collagen- Santyl .  Top with saline moistened gauze.  Cover with dry gauze and secure with silicone foam bordered dressing.  Turn patient side-to-side, minimize time and supine position. Turn patient every 2  hours.   07/21/22 1117            Discharge Assessment: Vitals:   07/21/22 0334 07/21/22 1227  BP: (!) 183/73 (!) 153/73  Pulse: 80 72  Resp: 18 18  Temp: 98.7 F (37.1 C) 97.8 F (36.6 C)  SpO2: 95% 98%   Skin clean, dry and intact without evidence of skin break down, no evidence of skin tears noted. IV catheter discontinued intact. Site without signs and symptoms of complications - no redness or edema noted at insertion site, patient denies c/o pain - only slight tenderness at site.  Dressing with slight pressure applied.  D/c Instructions-Education: Discharge instructions given to patient/family with verbalized understanding. D/c education completed with patient/family including follow up instructions, medication list, d/c activities limitations if indicated, with other d/c instructions as indicated by MD - patient able to verbalize understanding, all questions fully answered. Patient instructed to return to ED, call 911, or call MD for any changes in condition.  Patient escorted via WC, and D/C home via private auto.  Robert Dicker, RN 07/21/2022 12:33 PM

## 2022-07-26 DIAGNOSIS — L89150 Pressure ulcer of sacral region, unstageable: Secondary | ICD-10-CM | POA: Diagnosis not present

## 2022-07-26 DIAGNOSIS — L89610 Pressure ulcer of right heel, unstageable: Secondary | ICD-10-CM | POA: Diagnosis not present

## 2022-08-04 DEATH — deceased
